# Patient Record
Sex: Female | Born: 1955
Health system: Southern US, Academic
[De-identification: ages and names within clinical notes are randomized; demographics above are authoritative.]

## PROBLEM LIST (undated history)

## (undated) ENCOUNTER — Encounter: Payer: PRIVATE HEALTH INSURANCE | Attending: Adult Health | Primary: Adult Health

## (undated) ENCOUNTER — Ambulatory Visit: Payer: MEDICARE

## (undated) ENCOUNTER — Encounter

## (undated) ENCOUNTER — Ambulatory Visit

## (undated) ENCOUNTER — Encounter: Attending: Medical Oncology | Primary: Medical Oncology

## (undated) ENCOUNTER — Telehealth

## (undated) ENCOUNTER — Ambulatory Visit: Payer: PRIVATE HEALTH INSURANCE

## (undated) ENCOUNTER — Encounter: Attending: Adult Health | Primary: Adult Health

## (undated) ENCOUNTER — Encounter: Attending: Pharmacist | Primary: Pharmacist

## (undated) ENCOUNTER — Encounter: Payer: PRIVATE HEALTH INSURANCE | Attending: Medical Oncology | Primary: Medical Oncology

## (undated) ENCOUNTER — Ambulatory Visit: Payer: Medicare (Managed Care)

## (undated) ENCOUNTER — Telehealth: Attending: Family | Primary: Family

## (undated) ENCOUNTER — Encounter: Attending: Family | Primary: Family

## (undated) ENCOUNTER — Encounter: Payer: PRIVATE HEALTH INSURANCE | Attending: Family | Primary: Family

## (undated) ENCOUNTER — Telehealth: Attending: Medical Oncology | Primary: Medical Oncology

## (undated) ENCOUNTER — Encounter
Attending: Rehabilitative and Restorative Service Providers" | Primary: Rehabilitative and Restorative Service Providers"

## (undated) ENCOUNTER — Encounter: Attending: Gastroenterology | Primary: Gastroenterology

## (undated) ENCOUNTER — Ambulatory Visit: Attending: Mental Health | Primary: Mental Health

## (undated) ENCOUNTER — Encounter
Payer: PRIVATE HEALTH INSURANCE | Attending: Rehabilitative and Restorative Service Providers" | Primary: Rehabilitative and Restorative Service Providers"

## (undated) ENCOUNTER — Telehealth: Attending: Adult Health | Primary: Adult Health

## (undated) ENCOUNTER — Telehealth: Attending: Clinical | Primary: Clinical

## (undated) ENCOUNTER — Ambulatory Visit: Payer: PRIVATE HEALTH INSURANCE | Attending: Registered" | Primary: Registered"

## (undated) ENCOUNTER — Telehealth: Attending: Internal Medicine | Primary: Internal Medicine

## (undated) ENCOUNTER — Encounter: Attending: Internal Medicine | Primary: Internal Medicine

## (undated) ENCOUNTER — Ambulatory Visit: Payer: PRIVATE HEALTH INSURANCE | Attending: Medical Oncology | Primary: Medical Oncology

## (undated) ENCOUNTER — Ambulatory Visit: Attending: Plastic and Reconstructive Surgery | Primary: Plastic and Reconstructive Surgery

## (undated) ENCOUNTER — Telehealth: Attending: Pharmacist | Primary: Pharmacist

## (undated) ENCOUNTER — Ambulatory Visit: Attending: Internal Medicine | Primary: Internal Medicine

## (undated) DIAGNOSIS — T8859XA Other complications of anesthesia, initial encounter: Secondary | ICD-10-CM

## (undated) DIAGNOSIS — T4145XA Adverse effect of unspecified anesthetic, initial encounter: Secondary | ICD-10-CM

## (undated) DIAGNOSIS — F32A Depression, unspecified: Secondary | ICD-10-CM

## (undated) DIAGNOSIS — G629 Polyneuropathy, unspecified: Secondary | ICD-10-CM

## (undated) DIAGNOSIS — D219 Benign neoplasm of connective and other soft tissue, unspecified: Secondary | ICD-10-CM

## (undated) DIAGNOSIS — L680 Hirsutism: Secondary | ICD-10-CM

## (undated) DIAGNOSIS — C4492 Squamous cell carcinoma of skin, unspecified: Secondary | ICD-10-CM

## (undated) DIAGNOSIS — G4730 Sleep apnea, unspecified: Secondary | ICD-10-CM

## (undated) DIAGNOSIS — E785 Hyperlipidemia, unspecified: Secondary | ICD-10-CM

## (undated) DIAGNOSIS — K219 Gastro-esophageal reflux disease without esophagitis: Secondary | ICD-10-CM

## (undated) DIAGNOSIS — F329 Major depressive disorder, single episode, unspecified: Secondary | ICD-10-CM

## (undated) HISTORY — DX: Sleep apnea, unspecified: G47.30

## (undated) HISTORY — DX: Hirsutism: L68.0

## (undated) HISTORY — DX: Hyperlipidemia, unspecified: E78.5

## (undated) HISTORY — DX: Depression, unspecified: F32.A

## (undated) HISTORY — DX: Major depressive disorder, single episode, unspecified: F32.9

## (undated) HISTORY — DX: Benign neoplasm of connective and other soft tissue, unspecified: D21.9

## (undated) HISTORY — DX: Polyneuropathy, unspecified: G62.9

## (undated) HISTORY — DX: Squamous cell carcinoma of skin, unspecified: C44.92

## (undated) HISTORY — DX: Gastro-esophageal reflux disease without esophagitis: K21.9

## (undated) HISTORY — PX: TONSILLECTOMY: SUR1361

---
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
## 1999-12-13 ENCOUNTER — Other Ambulatory Visit: Admission: RE | Admit: 1999-12-13 | Discharge: 1999-12-13 | Payer: Self-pay | Admitting: Family Medicine

## 2001-07-16 ENCOUNTER — Other Ambulatory Visit: Admission: RE | Admit: 2001-07-16 | Discharge: 2001-07-16 | Payer: Self-pay | Admitting: Family Medicine

## 2001-07-17 ENCOUNTER — Ambulatory Visit (HOSPITAL_COMMUNITY): Admission: RE | Admit: 2001-07-17 | Discharge: 2001-07-17 | Payer: Self-pay | Admitting: Family Medicine

## 2001-07-17 ENCOUNTER — Encounter: Payer: Self-pay | Admitting: Family Medicine

## 2002-11-10 ENCOUNTER — Other Ambulatory Visit: Admission: RE | Admit: 2002-11-10 | Discharge: 2002-11-10 | Payer: Self-pay | Admitting: Family Medicine

## 2003-10-26 ENCOUNTER — Ambulatory Visit (HOSPITAL_COMMUNITY): Admission: RE | Admit: 2003-10-26 | Discharge: 2003-10-26 | Payer: Self-pay | Admitting: Family Medicine

## 2004-03-19 ENCOUNTER — Emergency Department (HOSPITAL_COMMUNITY): Admission: EM | Admit: 2004-03-19 | Discharge: 2004-03-19 | Payer: Self-pay | Admitting: *Deleted

## 2004-06-10 ENCOUNTER — Other Ambulatory Visit: Admission: RE | Admit: 2004-06-10 | Discharge: 2004-06-10 | Payer: Self-pay | Admitting: Family Medicine

## 2005-01-05 ENCOUNTER — Encounter: Admission: RE | Admit: 2005-01-05 | Discharge: 2005-01-05 | Payer: Self-pay | Admitting: Family Medicine

## 2005-06-12 ENCOUNTER — Other Ambulatory Visit: Admission: RE | Admit: 2005-06-12 | Discharge: 2005-06-12 | Payer: Self-pay | Admitting: Family Medicine

## 2006-02-13 ENCOUNTER — Encounter: Admission: RE | Admit: 2006-02-13 | Discharge: 2006-02-13 | Payer: Self-pay | Admitting: Family Medicine

## 2006-06-13 ENCOUNTER — Other Ambulatory Visit: Admission: RE | Admit: 2006-06-13 | Discharge: 2006-06-13 | Payer: Self-pay | Admitting: Family Medicine

## 2006-06-25 ENCOUNTER — Ambulatory Visit (HOSPITAL_COMMUNITY): Admission: RE | Admit: 2006-06-25 | Discharge: 2006-06-25 | Payer: Self-pay | Admitting: Family Medicine

## 2007-02-26 ENCOUNTER — Encounter: Admission: RE | Admit: 2007-02-26 | Discharge: 2007-02-26 | Payer: Self-pay | Admitting: Family Medicine

## 2007-05-14 ENCOUNTER — Encounter: Admission: RE | Admit: 2007-05-14 | Discharge: 2007-08-12 | Payer: Self-pay | Admitting: Orthopedic Surgery

## 2007-07-02 ENCOUNTER — Ambulatory Visit (HOSPITAL_COMMUNITY): Admission: RE | Admit: 2007-07-02 | Discharge: 2007-07-02 | Payer: Self-pay

## 2008-06-22 ENCOUNTER — Other Ambulatory Visit: Admission: RE | Admit: 2008-06-22 | Discharge: 2008-06-22 | Payer: Self-pay | Admitting: Family Medicine

## 2008-07-09 ENCOUNTER — Encounter: Admission: RE | Admit: 2008-07-09 | Discharge: 2008-07-09 | Payer: Self-pay | Admitting: Family Medicine

## 2009-07-12 ENCOUNTER — Encounter: Admission: RE | Admit: 2009-07-12 | Discharge: 2009-07-12 | Payer: Self-pay | Admitting: Family Medicine

## 2010-01-17 ENCOUNTER — Encounter: Admission: RE | Admit: 2010-01-17 | Discharge: 2010-01-17 | Payer: Self-pay | Admitting: Family Medicine

## 2010-01-25 ENCOUNTER — Other Ambulatory Visit: Admission: RE | Admit: 2010-01-25 | Discharge: 2010-01-25 | Payer: Self-pay | Admitting: Family Medicine

## 2010-09-02 ENCOUNTER — Other Ambulatory Visit: Payer: Self-pay | Admitting: Family Medicine

## 2010-09-02 DIAGNOSIS — Z1239 Encounter for other screening for malignant neoplasm of breast: Secondary | ICD-10-CM

## 2010-09-02 DIAGNOSIS — Z1231 Encounter for screening mammogram for malignant neoplasm of breast: Secondary | ICD-10-CM

## 2010-09-12 ENCOUNTER — Ambulatory Visit: Payer: Self-pay

## 2011-06-13 ENCOUNTER — Ambulatory Visit: Payer: Self-pay

## 2011-06-15 ENCOUNTER — Ambulatory Visit
Admission: RE | Admit: 2011-06-15 | Discharge: 2011-06-15 | Disposition: A | Discharge status: Home / Self Care | Payer: 59 | Source: Ambulatory Visit | Attending: Family Medicine | Admitting: Family Medicine

## 2011-06-15 DIAGNOSIS — Z1231 Encounter for screening mammogram for malignant neoplasm of breast: Secondary | ICD-10-CM

## 2012-07-26 ENCOUNTER — Other Ambulatory Visit: Payer: Self-pay | Admitting: Family Medicine

## 2012-07-26 DIAGNOSIS — Z1231 Encounter for screening mammogram for malignant neoplasm of breast: Secondary | ICD-10-CM

## 2012-08-01 ENCOUNTER — Ambulatory Visit
Admission: RE | Admit: 2012-08-01 | Discharge: 2012-08-01 | Disposition: A | Discharge status: Home / Self Care | Payer: 59 | Source: Ambulatory Visit | Attending: Family Medicine | Admitting: Family Medicine

## 2012-08-01 DIAGNOSIS — Z1231 Encounter for screening mammogram for malignant neoplasm of breast: Secondary | ICD-10-CM

## 2012-08-14 ENCOUNTER — Ambulatory Visit: Payer: 59

## 2012-09-12 ENCOUNTER — Other Ambulatory Visit (HOSPITAL_COMMUNITY)
Admission: RE | Admit: 2012-09-12 | Discharge: 2012-09-12 | Disposition: A | Discharge status: Home / Self Care | Payer: 59 | Source: Ambulatory Visit | Attending: Family Medicine | Admitting: Family Medicine

## 2012-09-12 ENCOUNTER — Other Ambulatory Visit: Payer: Self-pay | Admitting: Family Medicine

## 2012-09-12 DIAGNOSIS — Z0000 Encounter for general adult medical examination without abnormal findings: Secondary | ICD-10-CM | POA: Insufficient documentation

## 2012-12-27 ENCOUNTER — Other Ambulatory Visit: Payer: Self-pay | Admitting: *Deleted

## 2012-12-27 ENCOUNTER — Ambulatory Visit (INDEPENDENT_AMBULATORY_CARE_PROVIDER_SITE_OTHER): Payer: 59 | Admitting: Nurse Practitioner

## 2012-12-27 ENCOUNTER — Encounter: Payer: Self-pay | Admitting: Nurse Practitioner

## 2012-12-27 VITALS — BP 117/73 | HR 77 | Ht 67.0 in | Wt 211.0 lb

## 2012-12-27 DIAGNOSIS — G589 Mononeuropathy, unspecified: Secondary | ICD-10-CM | POA: Insufficient documentation

## 2012-12-27 DIAGNOSIS — R6889 Other general symptoms and signs: Secondary | ICD-10-CM

## 2012-12-27 NOTE — Progress Notes (Signed)
I reviewed note and agree with plan.   Suanne Marker, MD 12/27/2012, 1:48 PM Certified in Neurology, Neurophysiology and Neuroimaging  [facility] [address] [place], [place] [address] [phone]

## 2012-12-27 NOTE — Progress Notes (Signed)
HPI: Patient returns for followup after initial visit with Dr. Marjory Lies 09/27/2012. She has a history of numbness of the feet which has progressively worsened over time. Previous evaluation by neurology, orthopedist and podiatrist. Her symptoms have gotten worse over the last 2 years and bothersome when she walks or stands for a long period of time. She does not have back pain ,she has no symptoms in her neck or arms. She has also been diagnosed with restless leg syndrome, the symptoms are worse when she lays down at night. Her father and her father's mother both have high arched feet and numbness and tingling. Her symptoms are probably hereditary neuropathy. Labs have been negative  ROS:  negative  Physical Exam General: well developed, well nourished, seated, in no evident distress Head: head normocephalic and atraumatic. Oropharynx benign Neck: supple with no carotid  bruits Cardiovascular: regular rate and rhythm, no murmurs  Neurologic Exam Mental Status: Awake and fully alert. Oriented to place and time. Speech and language are normal .  Cranial Nerves:  Pupils equal, briskly reactive to light. Extraocular movements full without nystagmus. Visual fields full to confrontation. Hearing intact and symmetric to finger snap. Facial sensation intact. Face, tongue, palate move normally and symmetrically. Neck flexion and extension normal.  Motor: Normal bulk and tone. Normal strength in all tested extremity muscles. No focal weakness Sensory.: intact to touch and pinprick and vibratory.  Coordination: Rapid alternating movements normal in all extremities. Finger-to-nose and heel-to-shin performed accurately bilaterally. Gait and Station: Arises from chair without difficulty. Stance is normal.  Able to heel, toe and tandem walk without difficulty.  Reflexes: 2+ and symmetric. Toes downgoing.     ASSESSMENT: Numbness and pain in her feet that is well controlled with gabapentin. Hereditary  neuropathy from history     PLAN: She will continue her gabapentin, she does not need refills She will follow up in 6-8 months  Nilda Riggs, GNP-BC APRN

## 2012-12-27 NOTE — Patient Instructions (Addendum)
Continue Gabapentin at current dose.  Follow up in 6 to 8 months.

## 2013-01-05 DIAGNOSIS — C4492 Squamous cell carcinoma of skin, unspecified: Secondary | ICD-10-CM

## 2013-01-05 HISTORY — PX: OTHER SURGICAL HISTORY: SHX169

## 2013-01-05 HISTORY — DX: Squamous cell carcinoma of skin, unspecified: C44.92

## 2013-01-23 ENCOUNTER — Other Ambulatory Visit: Payer: Self-pay | Admitting: Dermatology

## 2013-02-19 ENCOUNTER — Encounter (INDEPENDENT_AMBULATORY_CARE_PROVIDER_SITE_OTHER): Payer: Self-pay

## 2013-02-20 ENCOUNTER — Ambulatory Visit (INDEPENDENT_AMBULATORY_CARE_PROVIDER_SITE_OTHER): Payer: 59 | Admitting: Surgery

## 2013-02-20 ENCOUNTER — Encounter (INDEPENDENT_AMBULATORY_CARE_PROVIDER_SITE_OTHER): Payer: Self-pay | Admitting: Surgery

## 2013-02-20 VITALS — BP 122/72 | HR 66 | Temp 97.6°F | Resp 15 | Ht 67.0 in | Wt 213.8 lb

## 2013-02-20 DIAGNOSIS — K602 Anal fissure, unspecified: Secondary | ICD-10-CM

## 2013-02-20 NOTE — Patient Instructions (Signed)
Take miralax and metamucil in am and pm.   Anal Fissure, Adult An anal fissure is a small tear or crack in the skin around the anus. Bleeding from a fissure usually stops on its own within a few minutes. However, bleeding will often reoccur with each bowel movement until the crack heals.  CAUSES   Passing large, hard stools.  Frequent diarrheal stools.  Constipation.  Inflammatory bowel disease (Crohn's disease or ulcerative colitis).  Infections.  Anal sex. SYMPTOMS   Small amounts of blood seen on your stools, on toilet paper, or in the toilet after a bowel movement.  Rectal bleeding.  Painful bowel movements.  Itching or irritation around the anus. DIAGNOSIS Your caregiver will examine the anal area. An anal fissure can usually be seen with careful inspection. A rectal exam may be performed and a short tube (anoscope) may be used to examine the anal canal. TREATMENT   You may be instructed to take fiber supplements. These supplements can soften your stool to help make bowel movements easier.  Sitz baths may be recommended to help heal the tear. Do not use soap in the sitz baths.  A medicated cream or ointment may be prescribed to lessen discomfort. HOME CARE INSTRUCTIONS   Maintain a diet high in fruits, whole grains, and vegetables. Avoid constipating foods like bananas and dairy products.  Take sitz baths as directed by your caregiver.  Drink enough fluids to keep your urine clear or pale yellow.  Only take over-the-counter or prescription medicines for pain, discomfort, or fever as directed by your caregiver. Do not take aspirin as this may increase bleeding.  Do not use ointments containing numbing medications (anesthetics) or hydrocortisone. They could slow healing. SEEK MEDICAL CARE IF:   Your fissure is not completely healed within 3 days.  You have further bleeding.  You have a fever.  You have diarrhea mixed with blood.  You have pain.  Your  problem is getting worse rather than better. MAKE SURE YOU:   Understand these instructions.  Will watch your condition.  Will get help right away if you are not doing well or get worse. Document Released: 07/24/2005 Document Revised: 10/16/2011 Document Reviewed: 01/08/2011 [facility] Patient Information 2014 [place], [place].

## 2013-02-20 NOTE — Progress Notes (Signed)
Patient ID: Linda Estrada, female   DOB: [birth year], 57 y.o.   MRN: [id number]  Chief Complaint  Patient presents with  . New Evaluation    eval lrg poss throm hems     HPI Linda Estrada is a 57 y.o. female.   HPIpatient sent at the request of Dr. Cliffton Asters do to severe pain in her anus for 3 weeks. She has a history of constipation and is taking some antibiotics for her previous scan lesion excision. She developed explosive diarrhea after severe constipation an using  enema at that time.since then she has constant pain in her anus made worse with her bowel movements the pain is severe at times. Pain never quite goes away it is constant was like a toothache. Denies any bleeding.  Past Medical History  Diagnosis Date  . Neuropathy   . Depression   . Hirsutism   . GERD (gastroesophageal reflux disease)   . Observed sleep apnea   . Fibroids   . Hyperlipidemia   . Squamous cell skin cancer     Past Surgical History  Procedure Laterality Date  . Tonsillectomy      Family History  Problem Relation Age of Onset  . Coronary artery disease Father   . Hypertension Father   . Diabetes Father   . Thyroid disease Father   . Hyperlipidemia Father   . Hypertension Mother   . Diabetes Mother   . Hyperlipidemia Mother   . Alzheimer's disease Mother   . Kidney failure Mother   . Emphysema Maternal Grandfather   . Diabetes Brother   . Thyroid disease Sister   . Breast cancer Paternal Aunt   . Cancer Paternal Aunt   . Breast cancer Paternal Aunt   . Cancer Paternal Aunt   . Colon cancer Paternal Aunt   . Cancer Paternal Aunt     Social History History  Substance Use Topics  . Smoking status: Never Smoker   . Smokeless tobacco: Never Used  . Alcohol Use: No    Allergies  Allergen Reactions  . Erythromycin Diarrhea  . Penicillins Rash    Current Outpatient Prescriptions  Medication Sig Dispense Refill  . buPROPion (WELLBUTRIN XL) 300 MG 24 hr tablet       . gabapentin  (NEURONTIN) 300 MG capsule 900 mg at bedtime.       . VENTOLIN HFA 108 (90 BASE) MCG/ACT inhaler       . phentermine 30 MG capsule Take 30 mg by mouth every morning.       No current facility-administered medications for this visit.    Review of Systems Review of Systems  Gastrointestinal: Positive for constipation and rectal pain.  Musculoskeletal: Negative.   Skin: Negative.   Hematological: Negative.   Psychiatric/Behavioral: Negative.     Blood pressure 122/72, pulse 66, temperature 97.6 F (36.4 C), temperature source Temporal, resp. rate 15, height 5\' 7"  (1.702 m), weight 213 lb 12.8 oz (96.979 kg).  Physical Exam Physical Exam  Constitutional: She is oriented to person, place, and time. She appears well-developed and well-nourished.  Genitourinary:     Musculoskeletal: Normal range of motion.  Neurological: She is alert and oriented to person, place, and time.  Skin: Skin is warm and dry.  Psychiatric: She has a normal mood and affect. Her behavior is normal. Judgment and thought content normal.    Data Reviewed Dr. Lucilla Lame note  Assessment    Anal fissure  Minimal internal hemorrhoid disease    Plan  Diltiazem gel.high-fiber diet. Continue MiraLAX and fiber supplement. Information about anal fissures given the patient. If this fails I discussed lateral internal sphincterotomy with the pros and cons of this.return 3 weeks.       Hydia Copelin A. 02/20/2013, 3:09 PM

## 2013-02-26 ENCOUNTER — Encounter (INDEPENDENT_AMBULATORY_CARE_PROVIDER_SITE_OTHER): Payer: Self-pay

## 2013-03-12 ENCOUNTER — Ambulatory Visit (INDEPENDENT_AMBULATORY_CARE_PROVIDER_SITE_OTHER): Payer: 59 | Admitting: Surgery

## 2013-03-12 ENCOUNTER — Encounter (INDEPENDENT_AMBULATORY_CARE_PROVIDER_SITE_OTHER): Payer: Self-pay | Admitting: Surgery

## 2013-03-12 VITALS — BP 150/80 | HR 74 | Resp 16 | Ht 67.0 in | Wt 214.2 lb

## 2013-03-12 DIAGNOSIS — K602 Anal fissure, unspecified: Secondary | ICD-10-CM

## 2013-03-12 NOTE — Patient Instructions (Signed)
You will be schedule for  exam under anesthesia possible lateral internal sphincterotomy/  hemorrhoidectomy

## 2013-03-12 NOTE — Progress Notes (Signed)
Subjective:     Patient ID: Linda Estrada, female   DOB: 21-Jun-1956, 57 y.o.   MRN: [id number]  HPI Patient returns in followup for anal fissure and rectal pain. She is no better on medical management.  Review of Systems  Respiratory: Negative.   Gastrointestinal: Positive for anal bleeding and rectal pain.  Genitourinary: Negative.        Objective:   Physical Exam  Constitutional: She is oriented to person, place, and time. She appears well-developed and well-nourished.  HENT:  Head: Normocephalic and atraumatic.  Neck: Normal range of motion. Neck supple.  Genitourinary:  Not reexamined  Musculoskeletal: Normal range of motion.  Neurological: She is alert and oriented to person, place, and time.  Skin: Skin is warm and dry.  Psychiatric: She has a normal mood and affect. Her behavior is normal. Judgment and thought content normal.       Assessment:     Anal fissure/  Rectal pain  No improvement    Plan:     Patient has failed medical management. Recommend exam under anesthesia with possible lateral internal sphincterotomy and possible hemorrhoidectomy. Risks, benefits and alternative therapies discussed. Risk of bleeding, infection, incontinence, and the need for other procedures discussed.The procedure has been discussed with the patient.  Alternative therapies have been discussed with the patient.  Operative risks include bleeding,  Infection,  Organ injury,  Nerve injury,  Blood vessel injury,  DVT,  Pulmonary embolism,  Death,  And possible reoperation.  Medical management risks include worsening of present situation.  The success of the procedure is 50 -90 % at treating patients symptoms.  The patient understands and agrees to proceed.

## 2013-03-13 ENCOUNTER — Encounter (INDEPENDENT_AMBULATORY_CARE_PROVIDER_SITE_OTHER): Payer: 59 | Admitting: Surgery

## 2013-03-13 ENCOUNTER — Encounter (HOSPITAL_COMMUNITY): Payer: Self-pay | Admitting: Pharmacy Technician

## 2013-03-19 ENCOUNTER — Encounter (HOSPITAL_COMMUNITY)
Admission: RE | Admit: 2013-03-19 | Discharge: 2013-03-19 | Disposition: A | Discharge status: Home / Self Care | Payer: 59 | Source: Ambulatory Visit | Attending: Surgery | Admitting: Surgery

## 2013-03-19 ENCOUNTER — Encounter (HOSPITAL_COMMUNITY): Payer: Self-pay

## 2013-03-19 DIAGNOSIS — Z01812 Encounter for preprocedural laboratory examination: Secondary | ICD-10-CM | POA: Insufficient documentation

## 2013-03-19 HISTORY — DX: Other complications of anesthesia, initial encounter: T88.59XA

## 2013-03-19 HISTORY — DX: Adverse effect of unspecified anesthetic, initial encounter: T41.45XA

## 2013-03-19 LAB — CBC WITH DIFFERENTIAL/PLATELET
Basophils Absolute: 0 10*3/uL (ref 0.0–0.1)
HCT: 42.3 % (ref 36.0–46.0)
Hemoglobin: 14.8 g/dL (ref 12.0–15.0)
Lymphocytes Relative: 38 % (ref 12–46)
Lymphs Abs: 3.7 10*3/uL (ref 0.7–4.0)
Monocytes Absolute: 0.9 10*3/uL (ref 0.1–1.0)
Neutro Abs: 5 10*3/uL (ref 1.7–7.7)
RBC: 5.08 MIL/uL (ref 3.87–5.11)
RDW: 13.8 % (ref 11.5–15.5)
WBC: 9.8 10*3/uL (ref 4.0–10.5)

## 2013-03-19 LAB — COMPREHENSIVE METABOLIC PANEL
ALT: 35 U/L (ref 0–35)
AST: 28 U/L (ref 0–37)
CO2: 27 mEq/L (ref 19–32)
Chloride: 102 mEq/L (ref 96–112)
Creatinine, Ser: 0.76 mg/dL (ref 0.50–1.10)
GFR calc non Af Amer: >90 mL/min (ref >90)
Sodium: 137 mEq/L (ref 135–145)
Total Bilirubin: 0.5 mg/dL (ref 0.3–1.2)

## 2013-03-19 NOTE — Patient Instructions (Addendum)
20 Linda Estrada  03/19/2013   Your procedure is scheduled on: 03-25-2013  Report to [facility] at 530 AM.  Call this number if you have problems the morning of surgery [phone]   Remember:Fleets enema morning of surgery, bring cpap mask and tubing   Do not eat food or drink liquids :After Midnight.     Take these medicines the morning of surgery with A SIP OF WATER: wellbutrin, zantac, ventolin inhaler if needed                                SEE [place] PREPARING FOR SURGERY SHEET   Do not wear jewelry, make-up or nail polish.  Do not wear lotions, powders, or perfumes. You may wear deodorant.   Men may shave face and neck.  Do not bring valuables to the hospital. [name] IS NOT RESPONSIBLE FOR VALUEABLES.  Contacts, dentures or bridgework may not be worn into surgery.  Leave suitcase in the car. After surgery it may be brought to your room.  For patients admitted to the hospital, checkout time is 11:00 AM the day of discharge.   Patients discharged the day of surgery will not be allowed to drive home.  Name and phone number of your driver: edward Lotito spouse  Special Instructions: N/A   Please read over the following fact sheets that you were given: MRSA Information.  Call Cain Sieve RN pre op nurse if needed [phone]    FAILURE TO FOLLOW THESE INSTRUCTIONS MAY RESULT IN THE CANCELLATION OF YOUR SURGERY.  PATIENT SIGNATURE___________________________________________  NURSE SIGNATURE_____________________________________________

## 2013-03-20 ENCOUNTER — Telehealth (INDEPENDENT_AMBULATORY_CARE_PROVIDER_SITE_OTHER): Payer: Self-pay | Admitting: *Deleted

## 2013-03-20 ENCOUNTER — Telehealth (INDEPENDENT_AMBULATORY_CARE_PROVIDER_SITE_OTHER): Payer: Self-pay

## 2013-03-20 NOTE — Telephone Encounter (Signed)
Patient is very concerned about some recent changes in her bowel habits over the last two days.  Patient reports she has continued to take daily Miralax, stool softner, Metamucil, fiber pills, and drinking plenty of water every day for some time now.  Patient reports that her normal is a 1 large bowel movement a day.  Patient reports her last normal bowel movement to be 2.5 days ago.  Patient states she did go yesterday morning however it was very small.  Patient asking if she should be concerned and what she should do.  Patient reports constipation and blockages were what caused the fistula and doesn't want to have any more issues.  Explained to patient that since she is already taking so much a message will be sent to Dr. Luisa Hart to ask for his suggestion on what patient should do.  Patient states understanding and agreeable at this time.

## 2013-03-20 NOTE — Telephone Encounter (Signed)
Can pass on enema

## 2013-03-20 NOTE — Telephone Encounter (Signed)
Pt is scheduled for surgery for her anal fissure  03/25/13.  She received pre op instructions for an enema the night before her surgery.  She said that she is in a lot of pain and is very hesitant about giving herself the enema.  She would like to know if there are any other options for bowel prep.  I did tell her there were oral preps, but I would need to check with Dr. Luisa Hart for any instructions.  She agreed and will wait to hear from [place].

## 2013-03-20 NOTE — Telephone Encounter (Signed)
Patient returned call, made patient aware that she can pass on the Enema.  Patient verbalized understanding.

## 2013-03-20 NOTE — Telephone Encounter (Signed)
LMOM. Pt can pass on enema per Dr Luisa Hart message below.

## 2013-03-23 NOTE — Telephone Encounter (Signed)
Needs to see GI specialist.

## 2013-03-24 NOTE — Telephone Encounter (Signed)
I called Linda Estrada and let her know Dr Luisa Hart recommends seeing a GI doctor. She states she is doing better at this time and will hold off on referral. She will call us back if she decides to see GI.

## 2013-03-25 ENCOUNTER — Encounter (HOSPITAL_COMMUNITY)
Admission: RE | Disposition: A | Discharge status: Home / Self Care | Payer: Self-pay | Source: Ambulatory Visit | Attending: Surgery

## 2013-03-25 ENCOUNTER — Encounter (HOSPITAL_COMMUNITY): Payer: Self-pay | Admitting: *Deleted

## 2013-03-25 ENCOUNTER — Encounter (HOSPITAL_COMMUNITY): Payer: Self-pay | Admitting: Anesthesiology

## 2013-03-25 ENCOUNTER — Ambulatory Visit (HOSPITAL_COMMUNITY)
Admission: RE | Admit: 2013-03-25 | Discharge: 2013-03-25 | Disposition: A | Discharge status: Home / Self Care | Payer: 59 | Source: Ambulatory Visit | Attending: Surgery | Admitting: Surgery

## 2013-03-25 ENCOUNTER — Ambulatory Visit (HOSPITAL_COMMUNITY): Payer: 59 | Admitting: Anesthesiology

## 2013-03-25 DIAGNOSIS — G589 Mononeuropathy, unspecified: Secondary | ICD-10-CM

## 2013-03-25 DIAGNOSIS — K648 Other hemorrhoids: Secondary | ICD-10-CM | POA: Insufficient documentation

## 2013-03-25 DIAGNOSIS — K625 Hemorrhage of anus and rectum: Secondary | ICD-10-CM | POA: Insufficient documentation

## 2013-03-25 DIAGNOSIS — K602 Anal fissure, unspecified: Secondary | ICD-10-CM | POA: Insufficient documentation

## 2013-03-25 DIAGNOSIS — K644 Residual hemorrhoidal skin tags: Secondary | ICD-10-CM | POA: Insufficient documentation

## 2013-03-25 DIAGNOSIS — R6889 Other general symptoms and signs: Secondary | ICD-10-CM

## 2013-03-25 HISTORY — PX: EXAMINATION UNDER ANESTHESIA: SHX1540

## 2013-03-25 SURGERY — EXAM UNDER ANESTHESIA
Anesthesia: General | Site: Rectum | Wound class: Clean Contaminated

## 2013-03-25 MED ORDER — OXYCODONE-ACETAMINOPHEN 5-325 MG PO TABS: 1.0000 | ORAL_TABLET | ORAL | Status: DC | PRN

## 2013-03-25 MED ORDER — LACTATED RINGERS IV SOLN
INTRAVENOUS | Status: DC | PRN
  Administered 2013-03-25: 07:00:00 via INTRAVENOUS

## 2013-03-25 MED ORDER — OXYCODONE-ACETAMINOPHEN 5-325 MG PO TABS: 2.0000 | ORAL_TABLET | ORAL | Status: DC | PRN

## 2013-03-25 MED ORDER — DEXAMETHASONE SODIUM PHOSPHATE 10 MG/ML IJ SOLN
INTRAMUSCULAR | Status: DC | PRN
  Administered 2013-03-25: 10 mg via INTRAVENOUS

## 2013-03-25 MED ORDER — KETOROLAC TROMETHAMINE 30 MG/ML IJ SOLN: 15.0000 mg | Freq: Once | INTRAMUSCULAR | Status: DC | PRN

## 2013-03-25 MED ORDER — LACTATED RINGERS IV SOLN: INTRAVENOUS | Status: DC

## 2013-03-25 MED ORDER — PROPOFOL 10 MG/ML IV BOLUS
INTRAVENOUS | Status: DC | PRN
  Administered 2013-03-25: 180 mg via INTRAVENOUS

## 2013-03-25 MED ORDER — FENTANYL CITRATE 0.05 MG/ML IJ SOLN: 25.0000 ug | INTRAMUSCULAR | Status: DC | PRN

## 2013-03-25 MED ORDER — FENTANYL CITRATE 0.05 MG/ML IJ SOLN
INTRAMUSCULAR | Status: DC | PRN
  Administered 2013-03-25 (×3): 50 ug via INTRAVENOUS
  Administered 2013-03-25: 100 ug via INTRAVENOUS

## 2013-03-25 MED ORDER — LIDOCAINE HCL (CARDIAC) 20 MG/ML IV SOLN
INTRAVENOUS | Status: DC | PRN
  Administered 2013-03-25: 50 mg via INTRAVENOUS

## 2013-03-25 MED ORDER — CIPROFLOXACIN IN D5W 400 MG/200ML IV SOLN
400.0000 mg | INTRAVENOUS | Status: AC
  Administered 2013-03-25: 400 mg via INTRAVENOUS

## 2013-03-25 MED ORDER — PROMETHAZINE HCL 25 MG/ML IJ SOLN: 6.2500 mg | INTRAMUSCULAR | Status: DC | PRN

## 2013-03-25 MED ORDER — MIDAZOLAM HCL 5 MG/5ML IJ SOLN
INTRAMUSCULAR | Status: DC | PRN
  Administered 2013-03-25: 2 mg via INTRAVENOUS

## 2013-03-25 MED ORDER — BUPIVACAINE-EPINEPHRINE (PF) 0.5% -1:200000 IJ SOLN
INTRAMUSCULAR | Status: AC
  Filled 2013-03-25: qty 10

## 2013-03-25 MED ORDER — LIDOCAINE HCL 2 % EX GEL: CUTANEOUS | Status: DC | PRN

## 2013-03-25 MED ORDER — FLEET ENEMA 7-19 GM/118ML RE ENEM: 1.0000 | ENEMA | Freq: Once | RECTAL | Status: DC

## 2013-03-25 MED ORDER — 0.9 % SODIUM CHLORIDE (POUR BTL) OPTIME
TOPICAL | Status: DC | PRN
  Administered 2013-03-25: 1000 mL

## 2013-03-25 MED ORDER — TRAMADOL HCL 50 MG PO TABS: 50.0000 mg | ORAL_TABLET | Freq: Four times a day (QID) | ORAL | Status: DC | PRN

## 2013-03-25 MED ORDER — ONDANSETRON HCL 4 MG/2ML IJ SOLN
INTRAMUSCULAR | Status: DC | PRN
  Administered 2013-03-25: 4 mg via INTRAVENOUS

## 2013-03-25 MED ORDER — SODIUM CHLORIDE 0.9 % IJ SOLN
INTRAMUSCULAR | Status: DC | PRN
  Administered 2013-03-25: 08:00:00

## 2013-03-25 MED ORDER — BUPIVACAINE LIPOSOME 1.3 % IJ SUSP
20.0000 mL | Freq: Once | INTRAMUSCULAR | Status: DC
  Filled 2013-03-25: qty 20

## 2013-03-25 MED ORDER — CIPROFLOXACIN IN D5W 400 MG/200ML IV SOLN
INTRAVENOUS | Status: AC
  Filled 2013-03-25: qty 200

## 2013-03-25 SURGICAL SUPPLY — 41 items
BLADE HEX COATED 2.75 (ELECTRODE) ×2 IMPLANT
BLADE SURG 15 STRL LF DISP TIS (BLADE) ×1 IMPLANT
BLADE SURG 15 STRL SS (BLADE) ×2
CLOTH BEACON ORANGE TIMEOUT ST (SAFETY) ×2 IMPLANT
DECANTER SPIKE VIAL GLASS SM (MISCELLANEOUS) ×1 IMPLANT
DRAIN PENROSE 18X1/2 LTX STRL (DRAIN) IMPLANT
DRSG PAD ABDOMINAL 8X10 ST (GAUZE/BANDAGES/DRESSINGS) IMPLANT
ELECT REM PT RETURN 9FT ADLT (ELECTROSURGICAL) ×2
ELECTRODE REM PT RTRN 9FT ADLT (ELECTROSURGICAL) ×1 IMPLANT
GAUZE SPONGE 4X4 16PLY XRAY LF (GAUZE/BANDAGES/DRESSINGS) ×2 IMPLANT
GLOVE BIO SURGEON STRL SZ8 (GLOVE) ×1 IMPLANT
GLOVE BIOGEL PI IND STRL 7.0 (GLOVE) ×1 IMPLANT
GLOVE BIOGEL PI IND STRL 8 (GLOVE) IMPLANT
GLOVE BIOGEL PI INDICATOR 7.0 (GLOVE) ×1
GLOVE BIOGEL PI INDICATOR 8 (GLOVE) ×1
GLOVE INDICATOR 8.0 STRL GRN (GLOVE) ×1 IMPLANT
GLOVE SS BIOGEL STRL SZ 8 (GLOVE) IMPLANT
GLOVE SUPERSENSE BIOGEL SZ 8 (GLOVE) ×1
GOWN STRL NON-REIN LRG LVL3 (GOWN DISPOSABLE) ×2 IMPLANT
GOWN STRL REIN XL XLG (GOWN DISPOSABLE) ×3 IMPLANT
HEMOSTAT SURGICEL 2X3 (HEMOSTASIS) ×1 IMPLANT
KIT BASIN OR (CUSTOM PROCEDURE TRAY) ×2 IMPLANT
LUBRICANT JELLY K Y 4OZ (MISCELLANEOUS) ×2 IMPLANT
NDL HYPO 25X1 1.5 SAFETY (NEEDLE) ×1 IMPLANT
NDL SAFETY ECLIPSE 18X1.5 (NEEDLE) ×1 IMPLANT
NEEDLE HYPO 18GX1.5 SHARP (NEEDLE)
NEEDLE HYPO 25X1 1.5 SAFETY (NEEDLE) ×2 IMPLANT
NS IRRIG 1000ML POUR BTL (IV SOLUTION) ×2 IMPLANT
PACK LITHOTOMY IV (CUSTOM PROCEDURE TRAY) ×1 IMPLANT
PENCIL BUTTON HOLSTER BLD 10FT (ELECTRODE) ×2 IMPLANT
SHEARS HARMONIC 9CM CVD (BLADE) ×1 IMPLANT
SPONGE SURGIFOAM ABS GEL 12-7 (HEMOSTASIS) ×1 IMPLANT
SUT CHROMIC 2 0 SH (SUTURE) IMPLANT
SUT CHROMIC 3 0 SH 27 (SUTURE) IMPLANT
SUT MON AB 3-0 SH 27 (SUTURE) ×4
SUT MON AB 3-0 SH27 (SUTURE) IMPLANT
SYR 50ML LL SCALE MARK (SYRINGE) ×1 IMPLANT
SYR CONTROL 10ML LL (SYRINGE) ×1 IMPLANT
TOWEL OR 17X26 10 PK STRL BLUE (TOWEL DISPOSABLE) ×2 IMPLANT
UNDERPAD 30X30 INCONTINENT (UNDERPADS AND DIAPERS) ×2 IMPLANT
YANKAUER SUCT BULB TIP 10FT TU (MISCELLANEOUS) ×1 IMPLANT

## 2013-03-25 NOTE — Anesthesia Postprocedure Evaluation (Signed)
  Anesthesia Post-op Note  Patient: Linda Estrada  Procedure(s) Performed: Procedure(s) (LRB): EXAM UNDER ANESTHESIA, POSSIBLE LATERAL INTERNAL SPHINCTEROTOMY (N/A)  Patient Location: PACU  Anesthesia Type: General  Level of Consciousness: awake and alert   Airway and Oxygen Therapy: Patient Spontanous Breathing  Post-op Pain: mild  Post-op Assessment: Post-op Vital signs reviewed, Patient's Cardiovascular Status Stable, Respiratory Function Stable, Patent Airway and No signs of Nausea or vomiting  Last Vitals:  Filed Vitals:   03/25/13 0900  BP: 131/77  Pulse: 69  Temp:   Resp: 14    Post-op Vital Signs: stable   Complications: No apparent anesthesia complications

## 2013-03-25 NOTE — Anesthesia Preprocedure Evaluation (Signed)
Anesthesia Evaluation  Patient identified by MRN, date of birth, ID band Patient awake    Reviewed: Allergy & Precautions, H&P , NPO status , Patient's Chart, lab work & pertinent test results  Airway Mallampati: II TM Distance: >3 FB Neck ROM: Full    Dental no notable dental hx.    Pulmonary sleep apnea ,  breath sounds clear to auscultation  Pulmonary exam normal       Cardiovascular negative cardio ROS  Rhythm:Regular Rate:Normal     Neuro/Psych negative neurological ROS  negative psych ROS   GI/Hepatic Neg liver ROS, GERD-  Medicated,  Endo/Other  negative endocrine ROS  Renal/GU negative Renal ROS  negative genitourinary   Musculoskeletal negative musculoskeletal ROS (+)   Abdominal   Peds negative pediatric ROS (+)  Hematology negative hematology ROS (+)   Anesthesia Other Findings   Reproductive/Obstetrics negative OB ROS                           Anesthesia Physical Anesthesia Plan  ASA: II  Anesthesia Plan: General   Post-op Pain Management:    Induction: Intravenous  Airway Management Planned: LMA  Additional Equipment:   Intra-op Plan:   Post-operative Plan:   Informed Consent: I have reviewed the patients History and Physical, chart, labs and discussed the procedure including the risks, benefits and alternatives for the proposed anesthesia with the patient or authorized representative who has indicated his/her understanding and acceptance.   Dental advisory given  Plan Discussed with: CRNA and Surgeon  Anesthesia Plan Comments:         Anesthesia Quick Evaluation

## 2013-03-25 NOTE — Interval H&P Note (Signed)
History and Physical Interval Note:  03/25/2013 7:12 AM  Linda Estrada  has presented today for surgery, with the diagnosis of anal pain/fissure  The various methods of treatment have been discussed with the patient and family. After consideration of risks, benefits and other options for treatment, the patient has consented to  Procedure(s): EXAM UNDER ANESTHESIA, POSSIBLE LATERAL INTERNAL SPHINCTEROTOMY (N/A) as a surgical intervention .  The patient's history has been reviewed, patient examined, no change in status, stable for surgery.  I have reviewed the patient's chart and labs.  Questions were answered to the patient's satisfaction.     Janis Cuffe A.

## 2013-03-25 NOTE — H&P (View-Only) (Signed)
Subjective:     Patient ID: Linda Estrada, female   DOB: 06/07/1956, 57 y.o.   MRN: [id number]  HPI Patient returns in followup for anal fissure and rectal pain. She is no better on medical management.  Review of Systems  Respiratory: Negative.   Gastrointestinal: Positive for anal bleeding and rectal pain.  Genitourinary: Negative.        Objective:   Physical Exam  Constitutional: She is oriented to person, place, and time. She appears well-developed and well-nourished.  HENT:  Head: Normocephalic and atraumatic.  Neck: Normal range of motion. Neck supple.  Genitourinary:  Not reexamined  Musculoskeletal: Normal range of motion.  Neurological: She is alert and oriented to person, place, and time.  Skin: Skin is warm and dry.  Psychiatric: She has a normal mood and affect. Her behavior is normal. Judgment and thought content normal.       Assessment:     Anal fissure/  Rectal pain  No improvement    Plan:     Patient has failed medical management. Recommend exam under anesthesia with possible lateral internal sphincterotomy and possible hemorrhoidectomy. Risks, benefits and alternative therapies discussed. Risk of bleeding, infection, incontinence, and the need for other procedures discussed.The procedure has been discussed with the patient.  Alternative therapies have been discussed with the patient.  Operative risks include bleeding,  Infection,  Organ injury,  Nerve injury,  Blood vessel injury,  DVT,  Pulmonary embolism,  Death,  And possible reoperation.  Medical management risks include worsening of present situation.  The success of the procedure is 50 -90 % at treating patients symptoms.  The patient understands and agrees to proceed.      

## 2013-03-25 NOTE — Op Note (Signed)
NAMELINDY, GARCZYNSKI NO.:  [id number]  MEDICAL RECORD NO.:  [id number]  LOCATION:  [facility]                         FACILITY:  [facility]  PHYSICIAN:  Chaylee Ehrsam A. Micca Matura, M.D.DATE OF BIRTH:  June 28, 1956  DATE OF PROCEDURE:  03/25/2013 DATE OF DISCHARGE:                              OPERATIVE REPORT   PREOPERATIVE DIAGNOSES: 1. Posterior midline anal fissure, nonhealing. 2. Column grade 3 prolapsed internal/external hemorrhoid disease     involving the left lateral and right posterior columns.  POSTOPERATIVE DIAGNOSES: 1. Posterior midline anal fissure, nonhealing. 2. Column grade 3 prolapsed internal/external hemorrhoid disease     involving the left lateral and right posterior columns.  PROCEDURE: 1. Closed right lateral internal sphincterotomy. 2. Internal/external hemorrhoidectomy involving 2 columns left lateral     and right posterior.  SURGEON:  Maisie Fus A. Torian Quintero, M.D.  ANESTHESIA:  LMA with Exparel 20 mL diluted with 20 mL for perianal block.  ESTIMATED BLOOD LOSS:  Less than 50 mL.  SPECIMEN:  Hemorrhoidal tissue to Pathology.  DRAINS:  None.  IV FLUIDS:  Approximately 600 mL of crystalloid.  INDICATIONS FOR PROCEDURE:  The patient is a pleasant 57 year old female, who has had a 75-month history of severe post defecation, rectal, and anal pain.  She was evaluated about a month ago, found to have a posterior midline anal fissure.  External maximal medical management. After about 3 weeks of this, her pain did not improve at all, and her examination did not change.  Discussed different options for treating a nonhealing posterior midline anal fissure, to include to continue medical management, Botox injection, versus lateral internal sphincterotomy.  We discussed all 3 options, the pros and cons of each option at this point in time.  I also discussed the potential for hemorrhoidectomy in the circumstances since she does have hemorrhoid disease.  I  recommended exam under anesthesia initially to better evaluate her anatomy and pathology with the possibility of the above 2 procedures being done.  Risk of sphincterotomy was discussed with her at great length.  Risk of bleeding, infection, recurrence, fecal incontinence of varying degrees, and the need to have the procedures done.  We discussed hemorrhoidectomy as the possibility of complications of bleeding, infection, pain, some urgency after having a bowel movement after surgery, and wound complications.  Success in treating her pain included to be anywhere from 85-90% with the above procedures.  After lengthy discussion, she wished to proceed.  DESCRIPTION OF PROCEDURE:  The patient was met in the holding area and questions were answered.  She was taken back to the operating room, placed supine position on the operating room table, where general anesthesia was initiated.  She was placed in lithotomy and appropriately padded.  Anal canal was then prepped and draped in a sterile fashion. Time-out was done and she received preoperative antibiotics as indicated in the operative record.  Examination was done distally.  Posterior midline anal fissure was seen.  Image relatively deep down to the sphincter.  Anoscope was placed.  The internal sphincter was significantly hypertrophied and the intersphincteric groove was identified.  An 11 blade was then placed in between the 2 sphincter muscles in a sharp edge  to help turn toward the internal sphincter at which time, I placed my finger over the blade and pushed dividing the internal sphincter.  This was done up to the dentate line.  The internal sphincter released and this was easily felt.  She did have significant grade 3 hemorrhoid disease involving the left lateral column and the right posterior column.  I felt that given her symptoms and the need to be addressed.  I talked about this with her upfront as well.  Harmonic scalp was used to  excise the left lateral pile, as well as the right posterior pile to include both the internal and external component. Hemostasis was achieved with oversewn mucosa with 3-0 Monocryl leaving the bottom open to drain.  Upon examination, she was hemostatic.  We placed a packing of Gelfoam which she had Surgicel wrapped around this, it is an anal plug.  ABD pad placed.  The patient taken in lithotomy. She was awoken and taken to recovery in satisfactory condition.  All final counts of sponge, needle, and instruments found to be correct at this portion of the case.     Somaly Marteney A. Abdulahad Mederos, M.D.     TAC/MEDQ  D:  03/25/2013  T:  03/25/2013  Job:  [id number]

## 2013-03-25 NOTE — Transfer of Care (Signed)
Immediate Anesthesia Transfer of Care Note  Patient: Linda Estrada  Procedure(s) Performed: Procedure(s): EXAM UNDER ANESTHESIA, POSSIBLE LATERAL INTERNAL SPHINCTEROTOMY (N/A)  Patient Location: PACU  Anesthesia Type:General  Level of Consciousness: awake, alert  and oriented  Airway & Oxygen Therapy: Patient Spontanous Breathing and Patient connected to face mask oxygen  Post-op Assessment: Report given to PACU RN and Post -op Vital signs reviewed and stable  Post vital signs: Reviewed and stable  Complications: No apparent anesthesia complications

## 2013-03-25 NOTE — Brief Op Note (Signed)
03/25/2013  8:29 AM  PATIENT:  Linda Estrada  57 y.o. female  PRE-OPERATIVE DIAGNOSIS:  anal pain/fissure  POST-OPERATIVE DIAGNOSIS:  anal pain/fissure  PROCEDURE:  Procedure(s): EXAM UNDER ANESTHESIA, POSSIBLE LATERAL INTERNAL SPHINCTEROTOMY (N/A)  SURGEON:  Surgeon(s) and Role:    * Tanieka Pownall A. Shaneya Taketa, MD - Primary      ANESTHESIA:   local and general  EBL:     BLOOD ADMINISTERED:none  DRAINS: none   LOCAL MEDICATIONS USED:  Exparel  SPECIMEN:  Source of Specimen:  hemorrhoid tissue  DISPOSITION OF SPECIMEN:  PATHOLOGY  COUNTS:  YES  TOURNIQUET:  * No tourniquets in log *  DICTATION: .Other Dictation: Dictation Number [id number]  PLAN OF CARE: Discharge to home after PACU  PATIENT DISPOSITION:  PACU - hemodynamically stable.   Delay start of Pharmacological VTE agent (>24hrs) due to surgical blood loss or risk of bleeding: not applicable

## 2013-03-26 ENCOUNTER — Encounter (HOSPITAL_COMMUNITY): Payer: Self-pay | Admitting: Surgery

## 2013-03-26 ENCOUNTER — Telehealth (INDEPENDENT_AMBULATORY_CARE_PROVIDER_SITE_OTHER): Payer: Self-pay | Admitting: *Deleted

## 2013-03-26 NOTE — Telephone Encounter (Signed)
Patient called in this morning reporting she can feel gauze at her rectal opening and concerned about having a BM.  Spoke to Dr. Luisa Hart who states he did a packing at which he is expecting to fall out with a BM.  Patient updated at this time that it is ok for the gauze to fall out.  Encouraged patient not to strain or pull at the gauze.  Also encouraged her to use the stool softeners and drink plenty of water to stay hydrated.  Patient states understanding and agreeable at this time.

## 2013-03-27 ENCOUNTER — Encounter (HOSPITAL_COMMUNITY): Payer: Self-pay | Admitting: Emergency Medicine

## 2013-03-27 ENCOUNTER — Emergency Department (HOSPITAL_COMMUNITY)
Admission: EM | Admit: 2013-03-27 | Discharge: 2013-03-28 | Disposition: A | Discharge status: Home / Self Care | Payer: 59 | Attending: Emergency Medicine | Admitting: Emergency Medicine

## 2013-03-27 ENCOUNTER — Emergency Department (HOSPITAL_COMMUNITY): Payer: 59

## 2013-03-27 DIAGNOSIS — Z872 Personal history of diseases of the skin and subcutaneous tissue: Secondary | ICD-10-CM | POA: Insufficient documentation

## 2013-03-27 DIAGNOSIS — K560 Paralytic ileus: Secondary | ICD-10-CM | POA: Insufficient documentation

## 2013-03-27 DIAGNOSIS — K219 Gastro-esophageal reflux disease without esophagitis: Secondary | ICD-10-CM | POA: Insufficient documentation

## 2013-03-27 DIAGNOSIS — G589 Mononeuropathy, unspecified: Secondary | ICD-10-CM | POA: Insufficient documentation

## 2013-03-27 DIAGNOSIS — F3289 Other specified depressive episodes: Secondary | ICD-10-CM | POA: Insufficient documentation

## 2013-03-27 DIAGNOSIS — F329 Major depressive disorder, single episode, unspecified: Secondary | ICD-10-CM | POA: Insufficient documentation

## 2013-03-27 DIAGNOSIS — R110 Nausea: Secondary | ICD-10-CM | POA: Insufficient documentation

## 2013-03-27 DIAGNOSIS — Z862 Personal history of diseases of the blood and blood-forming organs and certain disorders involving the immune mechanism: Secondary | ICD-10-CM | POA: Insufficient documentation

## 2013-03-27 DIAGNOSIS — G4730 Sleep apnea, unspecified: Secondary | ICD-10-CM | POA: Insufficient documentation

## 2013-03-27 DIAGNOSIS — K567 Ileus, unspecified: Secondary | ICD-10-CM

## 2013-03-27 DIAGNOSIS — Z8639 Personal history of other endocrine, nutritional and metabolic disease: Secondary | ICD-10-CM | POA: Insufficient documentation

## 2013-03-27 DIAGNOSIS — K5900 Constipation, unspecified: Secondary | ICD-10-CM | POA: Insufficient documentation

## 2013-03-27 DIAGNOSIS — R5381 Other malaise: Secondary | ICD-10-CM | POA: Insufficient documentation

## 2013-03-27 DIAGNOSIS — Z79899 Other long term (current) drug therapy: Secondary | ICD-10-CM | POA: Insufficient documentation

## 2013-03-27 DIAGNOSIS — Z85828 Personal history of other malignant neoplasm of skin: Secondary | ICD-10-CM | POA: Insufficient documentation

## 2013-03-27 DIAGNOSIS — R109 Unspecified abdominal pain: Secondary | ICD-10-CM | POA: Insufficient documentation

## 2013-03-27 DIAGNOSIS — Z9981 Dependence on supplemental oxygen: Secondary | ICD-10-CM | POA: Insufficient documentation

## 2013-03-27 LAB — COMPREHENSIVE METABOLIC PANEL
ALT: 27 U/L (ref 0–35)
Alkaline Phosphatase: 72 U/L (ref 39–117)
GFR calc Af Amer: 87 mL/min — ABNORMAL LOW (ref >90)
Glucose, Bld: 104 mg/dL — ABNORMAL HIGH (ref 70–99)
Potassium: 4 mEq/L (ref 3.5–5.1)
Sodium: 142 mEq/L (ref 135–145)
Total Protein: 7.2 g/dL (ref 6.0–8.3)

## 2013-03-27 LAB — CBC WITH DIFFERENTIAL/PLATELET
Eosinophils Absolute: 0.1 10*3/uL (ref 0.0–0.7)
Lymphocytes Relative: 24 % (ref 12–46)
Lymphs Abs: 3.3 10*3/uL (ref 0.7–4.0)
Neutrophils Relative %: 67 % (ref 43–77)
Platelets: 238 10*3/uL (ref 150–400)
RBC: 4.92 MIL/uL (ref 3.87–5.11)
WBC: 14 10*3/uL — ABNORMAL HIGH (ref 4.0–10.5)

## 2013-03-27 MED ORDER — ONDANSETRON HCL 4 MG/2ML IJ SOLN
4.0000 mg | Freq: Once | INTRAMUSCULAR | Status: AC
  Administered 2013-03-27: 4 mg via INTRAVENOUS
  Filled 2013-03-27 (×2): qty 2

## 2013-03-27 MED ORDER — LIDOCAINE HCL 2 % EX GEL
CUTANEOUS | Status: AC
  Filled 2013-03-27: qty 10

## 2013-03-27 MED ORDER — SODIUM CHLORIDE 0.9 % IV BOLUS (SEPSIS)
1000.0000 mL | Freq: Once | INTRAVENOUS | Status: AC
  Administered 2013-03-27: 1000 mL via INTRAVENOUS

## 2013-03-27 MED ORDER — SORBITOL 70 % SOLN
960.0000 mL | TOPICAL_OIL | Freq: Once | ORAL | Status: DC
  Filled 2013-03-27: qty 240

## 2013-03-27 MED ORDER — SODIUM CHLORIDE 0.9 % IV BOLUS (SEPSIS): 1000.0000 mL | Freq: Once | INTRAVENOUS | Status: DC

## 2013-03-27 NOTE — Telephone Encounter (Signed)
Pt called again today concerned because she has still not had a BM.  She is taking the Miralax and stool softeners as directed with no relief.  She is concerned that she still has external hemorrhoids.  I explained that most likely she was feeling post op swelling which would improve with time.  The sitz baths should help as well.  Dr. Luisa Hart paged for further instructions.

## 2013-03-27 NOTE — Telephone Encounter (Signed)
Spoke with Dr. Luisa Hart and he instructed the patient to double the Miralax dose, and if no results to triple the dose.  Sitz bath no less than three times a day and reduce pain medication.  Pt seemed calmer and agreed to all instructions.  She will call tomorrow to let us know how she is doing.

## 2013-03-27 NOTE — Telephone Encounter (Signed)
Take Miralax BID or TID if needed. ITS NOT FORM EXTERNAL OR INTERNAL HEMORRHOIDS.  SOAK IN TUB AND CUT BACK ON PAIN MEDS.

## 2013-03-27 NOTE — Telephone Encounter (Signed)
Patient calling into office very tearful stating that she has not had a bowel movement since Tuesday and that she feel's that she may be impacted.  Patient reports that she has done everything that our office has advised her to do from sitz baths, miralax, stool softeners, drinking plenty of fluids, increasing her activity and still has not had a bowel movement or any reflief from pain.  Patient reports that her pain level is higher than 10.  Patient denies having any nausea/vomiting or fever.  Patient denies having bleeding or drainage from rectal area.  Patient states that she has even doubled up taking miralax and still no bowel movement.  Patient having uncontrolled pain and very tearful with concern, I advised patient to go to the emergency department for further assessment.  Patient states she will go to [facility] ED.   Patient s/p EUA, Closed right lateral internal sphincterotomy Internal/external hemorrhoidectomy involving 2 columns left lateral and right posterior on 03/25/2013 by Dr. Luisa Hart.

## 2013-03-27 NOTE — ED Provider Notes (Signed)
TIME SEEN: 6:08 PM  CHIEF COMPLAINT: Abdominal pain, nausea, constipation  HPI: Patient is a 57 y.o. female with history of recent anal fissure surgery and hemorrhoidectomy 2 days ago who presents the emergency department with constipation, abdominal pain cramping, nausea. Patient reports that she feels like she needs to have a bowel movement but cannot. She denies any fever, chills, vomiting, dysuria or hematuria. She has tried drinking lots of water and taking MiraLax without any relief. She is taking tramadol for pain.  ROS: See HPI Constitutional: no fever  Eyes: no drainage  ENT: no runny nose   Cardiovascular:  no chest pain  Resp: no SOB  GI: no vomiting GU: no dysuria Integumentary: no rash  Allergy: no hives  Musculoskeletal: no leg swelling  Neurological: no slurred speech ROS otherwise negative  PAST MEDICAL HISTORY/PAST SURGICAL HISTORY:  Past Medical History  Diagnosis Date  . Neuropathy     both feet numbness all the time  . Depression   . Hirsutism   . GERD (gastroesophageal reflux disease)   . Fibroids   . Hyperlipidemia   . Observed sleep apnea     cpap, setting of 3.0  . Complication of anesthesia     woke up screaming after surgery as teenager  . Squamous cell skin cancer 01-2013    MEDICATIONS:  Prior to Admission medications   Medication Sig Start Date End Date Taking? Authorizing Provider  acetaminophen (TYLENOL) 500 MG tablet Take 1,000 mg by mouth every 6 (six) hours as needed for pain.    Historical Provider, MD  buPROPion (WELLBUTRIN XL) 300 MG 24 hr tablet Take 300 mg by mouth every morning.  12/18/12   Historical Provider, MD  gabapentin (NEURONTIN) 300 MG capsule Take 900 mg by mouth at bedtime.  12/17/12   Historical Provider, MD  lidocaine (XYLOCAINE JELLY) 2 % jelly Apply topically as needed. 03/25/13   Thomas A. Cornett, MD  methylcellulose packet Take 500 each by mouth 2 (two) times daily.    Historical Provider, MD  naproxen sodium (ANAPROX)  220 MG tablet Take 220 mg by mouth as needed.    Historical Provider, MD  oxyCODONE-acetaminophen (ROXICET) 5-325 MG per tablet Take 2 tablets by mouth every 4 (four) hours as needed for pain. 03/25/13   Thomas A. Cornett, MD  polyethylene glycol (MIRALAX / GLYCOLAX) packet Take 17 g by mouth at bedtime.    Historical Provider, MD  psyllium (METAMUCIL SMOOTH TEXTURE) 28 % packet Take 1 packet by mouth every morning.    Historical Provider, MD  ranitidine (ZANTAC) 150 MG tablet Take 150 mg by mouth daily.    Historical Provider, MD  senna (SENOKOT) 8.6 MG tablet Take 1 tablet by mouth as needed for constipation.    Historical Provider, MD  traMADol (ULTRAM) 50 MG tablet Take 1 tablet (50 mg total) by mouth every 6 (six) hours as needed for pain. 03/25/13   Thomas A. Cornett, MD  VENTOLIN HFA 108 (90 BASE) MCG/ACT inhaler Inhale 2 puffs into the lungs every 4 (four) hours as needed for wheezing or shortness of breath.  09/23/12   Historical Provider, MD    ALLERGIES:  Allergies  Allergen Reactions  . Erythromycin Diarrhea  . Penicillins Rash    SOCIAL HISTORY:  History  Substance Use Topics  . Smoking status: Never Smoker   . Smokeless tobacco: Never Used  . Alcohol Use: No    FAMILY HISTORY: Family History  Problem Relation Age of Onset  . Coronary  artery disease Father   . Hypertension Father   . Diabetes Father   . Thyroid disease Father   . Hyperlipidemia Father   . Hypertension Mother   . Diabetes Mother   . Hyperlipidemia Mother   . Alzheimer's disease Mother   . Kidney failure Mother   . Emphysema Maternal Grandfather   . Diabetes Brother   . Thyroid disease Sister   . Breast cancer Paternal Aunt   . Cancer Paternal Aunt   . Breast cancer Paternal Aunt   . Cancer Paternal Aunt   . Colon cancer Paternal Aunt   . Cancer Paternal Aunt     EXAM: BP 158/86  Pulse 99  Temp(Src) 98.9 F (37.2 C) (Oral)  Resp 20  SpO2 94%  LMP 11/17/2012 CONSTITUTIONAL: Alert and  oriented and responds appropriately to questions. Well-appearing; well-nourished HEAD: Normocephalic EYES: Conjunctivae clear, PERRL ENT: normal nose; no rhinorrhea; moist mucous membranes; pharynx without lesions noted NECK: Supple, no meningismus, no LAD  CARD: RRR; S1 and S2 appreciated; no murmurs, no clicks, no rubs, no gallops RESP: Normal chest excursion without splinting or tachypnea; breath sounds clear and equal bilaterally; no wheezes, no rhonchi, no rales,  ABD/GI: Normal bowel sounds; non-distended; soft, mildly, diffusely tender to palpation, no rebound, no guarding RECTAL:  Patient has perirectal ecchymosis and multiple nonthrombosed hemorrhoids externally, sutures palpated inside the rectum, there is soft stool present in the rectum, no impaction, small amount of gross blood BACK:  The back appears normal and is non-tender to palpation, there is no CVA tenderness EXT: Normal ROM in all joints; non-tender to palpation; no edema; normal capillary refill; no cyanosis    SKIN: Normal color for age and race; warm NEURO: Moves all extremities equally PSYCH: The patient's mood and manner are appropriate. Grooming and personal hygiene are appropriate.  MEDICAL DECISION MAKING: Patient with constipation after her hemorrhoidectomy and anal fissure surgery. She is not impacted. Her abdominal x-ray shows no obstruction. She does have an ileus. Will check labs, urinalysis. Will give smog enema, IV fluids and antiemetics. Anticipate discharge home.  ED PROGRESS: Patient able to have bowel movements and urinate after smog enema. She reports feeling much better and is ready for discharge home. Given supportive care instructions, return precautions. She has outpatient followup with surgery. We'll have her continue to drink plenty of fluids, increase her fiber intake, continue MiraLax and Colace.  Layla Maw Floyde Dingley, DO 03/27/13 2356

## 2013-03-27 NOTE — ED Notes (Signed)
Patient reports that she had anal fissure surgery and hemorrhoidectomy on Tuesday and that she has not had a bowel movement since the surgery. Patient complains of hunger, constipation, weakness, difficulty sleeping, mild abdominal cramping, rectal pain, and mild nausea.

## 2013-03-28 ENCOUNTER — Telehealth (INDEPENDENT_AMBULATORY_CARE_PROVIDER_SITE_OTHER): Payer: Self-pay | Admitting: *Deleted

## 2013-03-28 NOTE — Telephone Encounter (Signed)
Patient called this morning to report that she did go to [facility] ED last night.  Patient reports they gave her a enema which has given her a lot of relief.  Patient states understanding to continue the Miralax, Colace, and drinking plenty of fluid.  Patient just wanted to give [place] an update.

## 2013-04-09 ENCOUNTER — Encounter (INDEPENDENT_AMBULATORY_CARE_PROVIDER_SITE_OTHER): Payer: Self-pay | Admitting: Surgery

## 2013-04-09 ENCOUNTER — Ambulatory Visit (INDEPENDENT_AMBULATORY_CARE_PROVIDER_SITE_OTHER): Payer: 59 | Admitting: Surgery

## 2013-04-09 VITALS — BP 114/74 | HR 72 | Resp 14 | Ht 67.0 in | Wt 203.4 lb

## 2013-04-09 DIAGNOSIS — Z9889 Other specified postprocedural states: Secondary | ICD-10-CM | POA: Insufficient documentation

## 2013-04-09 NOTE — Progress Notes (Signed)
Patient returns after exam and anesthesia with lateral internal sphincterotomy and 2 column internal and external hemorrhoidectomy. She had some initial issues with constipation postop period which have resolved. Still has pain and drainage at this point. No fever or chills. He  Exam: Anal canal clean dry and intact. Wound showing good granulation tissue.  Impression: Status post lateral internal sphincterotomy for anal fissure and 2 column hemorrhoidectomy  Plan: Return in 3 weeks. Continue present care. Add fiber back to diet. Return to work next week if possible. May need extra week off from work depending on pain

## 2013-04-09 NOTE — Patient Instructions (Signed)
Return 3 weeks.  Add fiber back into diet. Back to work next week if you are ready

## 2013-05-01 ENCOUNTER — Encounter (INDEPENDENT_AMBULATORY_CARE_PROVIDER_SITE_OTHER): Payer: Self-pay | Admitting: Surgery

## 2013-05-01 ENCOUNTER — Ambulatory Visit (INDEPENDENT_AMBULATORY_CARE_PROVIDER_SITE_OTHER): Payer: 59 | Admitting: Surgery

## 2013-05-01 VITALS — BP 130/84 | HR 76 | Resp 16 | Ht 67.0 in | Wt 202.2 lb

## 2013-05-01 DIAGNOSIS — Z9889 Other specified postprocedural states: Secondary | ICD-10-CM

## 2013-05-01 NOTE — Progress Notes (Signed)
Patient returns after exam and anesthesia with lateral internal sphincterotomy and 2 column internal and external hemorrhoidectomy. She had some initial issues with constipation postop period which have resolved. She is improved.   Exam: Anal canal clean dry and intact. Wound showing less granulation tissue. No stenosis nl tone  Impression: Status post lateral internal sphincterotomy for anal fissure and 2 column hemorrhoidectomy  Plan: Return prn . Continue present care. Add fiber back to diet. Return to work next week if possible. May need extra week off from work depending on pain

## 2013-05-01 NOTE — Patient Instructions (Signed)
Resume full activity. Return as needed.  

## 2013-06-03 ENCOUNTER — Telehealth: Payer: Self-pay | Admitting: Diagnostic Neuroimaging

## 2013-06-03 MED ORDER — GABAPENTIN 300 MG PO CAPS: 900.0000 mg | ORAL_CAPSULE | Freq: Every day | ORAL | Status: DC

## 2013-06-03 NOTE — Telephone Encounter (Signed)
Refill placed.  Pt has appt with Enid Skeens, NP on 07-10-13.

## 2013-07-07 ENCOUNTER — Other Ambulatory Visit: Payer: Self-pay

## 2013-07-07 DIAGNOSIS — Z1231 Encounter for screening mammogram for malignant neoplasm of breast: Secondary | ICD-10-CM

## 2013-07-10 ENCOUNTER — Encounter (INDEPENDENT_AMBULATORY_CARE_PROVIDER_SITE_OTHER): Payer: Self-pay

## 2013-07-10 ENCOUNTER — Encounter: Payer: Self-pay | Admitting: Nurse Practitioner

## 2013-07-10 ENCOUNTER — Ambulatory Visit (INDEPENDENT_AMBULATORY_CARE_PROVIDER_SITE_OTHER): Payer: 59 | Admitting: Nurse Practitioner

## 2013-07-10 VITALS — BP 116/75 | HR 80 | Ht 68.0 in | Wt 206.0 lb

## 2013-07-10 DIAGNOSIS — R6889 Other general symptoms and signs: Secondary | ICD-10-CM

## 2013-07-10 DIAGNOSIS — G589 Mononeuropathy, unspecified: Secondary | ICD-10-CM

## 2013-07-10 MED ORDER — GABAPENTIN 300 MG PO CAPS: 900.0000 mg | ORAL_CAPSULE | Freq: Every day | ORAL | Status: DC

## 2013-07-10 NOTE — Progress Notes (Signed)
[facility]  PATIENT: Linda Estrada DOB: 12/31/1955   REASON FOR VISIT: followup for peripheral neuropathy   HISTORY OF PRESENT ILLNESS: Linda Estrada, 57 year old female returns for followup. She has a history of neuropathy and her symptoms are fairly well controlled on her gabapentin. Because of daytime drowsiness she only takes the medication at night. She cannot walk or stand for long periods of time, she describes this as feeling like she is walking on stones. She has not had back pain or other symptomology. She has high arches in her father and grandmother also had a neuropathy therefore hers is thought to be hereditary. She feels her medication is controlling her symptoms for the most part and she does not want an increase in medication at this time. She does need refills on her medication.   HISTORY: She has a history of numbness of the feet which has progressively worsened over time. Previous evaluation by neurology, orthopedist and podiatrist. Her symptoms have gotten worse over the last 2 years and bothersome when she walks or stands for a long period of time. She does not have back pain ,she has no symptoms in her neck or arms. She has also been diagnosed with restless leg syndrome, the symptoms are worse when she lays down at night. Her father and her father's mother both have high arched feet and numbness and tingling. Her symptoms are probably hereditary neuropathy. Labs have been negative  REVIEW OF SYSTEMS: Full 14 system review of systems performed and notable only [id number]:[id number] noted all others are negative  Constitutional: N/A  Cardiovascular: Swelling in the leg  Ear/Nose/Throat: N/A  Skin: N/A  Eyes: N/A  Respiratory: N/A  Gastroitestinal: N/A  Hematology/Lymphatic: N/A  Endocrine: N/A Musculoskeletal:N/A  Allergy/Immunology: N/A  Neurological: N/A Psychiatric: N/A   ALLERGIES: Allergies  Allergen Reactions  . Erythromycin Diarrhea  . Penicillins  Rash    HOME MEDICATIONS: Outpatient Prescriptions Prior to Visit  Medication Sig Dispense Refill  . buPROPion (WELLBUTRIN XL) 300 MG 24 hr tablet Take 300 mg by mouth every morning.       . docusate sodium (COLACE) 100 MG capsule Take 100 mg by mouth 2 (two) times daily.      Marland Kitchen gabapentin (NEURONTIN) 300 MG capsule Take 3 capsules (900 mg total) by mouth at bedtime.  90 capsule  3  . polyethylene glycol (MIRALAX / GLYCOLAX) packet Take 17 g by mouth at bedtime.      . ranitidine (ZANTAC) 150 MG tablet Take 150 mg by mouth daily.      . VENTOLIN HFA 108 (90 BASE) MCG/ACT inhaler Inhale 2 puffs into the lungs every 4 (four) hours as needed for wheezing or shortness of breath.       . naproxen sodium (ANAPROX) 220 MG tablet Take 220 mg by mouth as needed (for pain).        No facility-administered medications prior to visit.    PAST MEDICAL HISTORY: Past Medical History  Diagnosis Date  . Neuropathy     both feet numbness all the time  . Depression   . Hirsutism   . GERD (gastroesophageal reflux disease)   . Fibroids   . Hyperlipidemia   . Observed sleep apnea     cpap, setting of 3.0  . Complication of anesthesia     woke up screaming after surgery as teenager  . Squamous cell skin cancer 01-2013    PAST SURGICAL HISTORY: Past Surgical History  Procedure Laterality Date  . Tonsillectomy  as child  . Squamous skin cell cancer removed  01-2013    lower right leg  . Examination under anesthesia N/A 03/25/2013    Procedure: EXAM UNDER ANESTHESIA, POSSIBLE LATERAL INTERNAL SPHINCTEROTOMY;  Surgeon: Maisie Fus A. Cornett, MD;  Location: [facility];  Service: General;  Laterality: N/A;    FAMILY HISTORY: Family History  Problem Relation Age of Onset  . Coronary artery disease Father   . Hypertension Father   . Diabetes Father   . Thyroid disease Father   . Hyperlipidemia Father   . Hypertension Mother   . Diabetes Mother   . Hyperlipidemia Mother   . Alzheimer's disease Mother     . Kidney failure Mother   . Emphysema Maternal Grandfather   . Diabetes Brother   . Thyroid disease Sister   . Breast cancer Paternal Aunt   . Cancer Paternal Aunt   . Breast cancer Paternal Aunt   . Cancer Paternal Aunt   . Colon cancer Paternal Aunt   . Cancer Paternal Aunt     SOCIAL HISTORY: History   Social History  . Marital Status: Married    Spouse Name: ED    Number of Children: 2  . Years of Education: 12+   Occupational History  .  [employer]   Social History Main Topics  . Smoking status: Never Smoker   . Smokeless tobacco: Never Used  . Alcohol Use: No  . Drug Use: No  . Sexual Activity: Not on file   Other Topics Concern  . Not on file   Social History Narrative   Patient lives at home with husband Ed.    Patient has 2 children.    Patient has a high school education plus 2 years of college.     Patient is currently working.      PHYSICAL EXAM  Filed Vitals:   07/10/13 0831  BP: 116/75  Pulse: 80  Height: 5\' 8"  (1.727 m)  Weight: 206 lb (93.441 kg)   Body mass index is 31.33 kg/(m^2).  Generalized: Well developed, obese female in no acute distress  Head: normocephalic and atraumatic,. Oropharynx benign  Neck: Supple, no carotid bruits  Cardiac: Regular rate rhythm, no murmur     Neurological examination   Mentation: Alert oriented to time, place, history taking. Follows all commands speech and language fluent  Cranial nerve II-XII: Pupils were equal round reactive to light extraocular movements were full, visual field were full on confrontational test. Facial sensation and strength were normal. hearing was intact to finger rubbing bilaterally. Uvula tongue midline. head turning and shoulder shrug and were normal and symmetric.Tongue protrusion into cheek strength was normal. Motor: normal bulk and tone, full strength in the BUE, BLE, fine finger movements normal, no pronator drift. No focal weakness Sensory: Decreased pinprick to  knee bilaterally, intact to touch and vibratory   Coordination: finger-nose-finger, heel-to-shin bilaterally, no dysmetria Reflexes: Brachioradialis 2/2, biceps 2/2, triceps 2/2, patellar 2/2, Achilles 1/1, plantar responses were flexor bilaterally. Gait and Station: Rising up from seated position without assistance, normal stance, moderate stride, good arm swing, smooth turning, able to perform tiptoe, and heel walking without difficulty. Tandem gait is steady   DIAGNOSTIC DATA (LABS, IMAGING, TESTING) - I reviewed patient records, labs, notes, testing and imaging myself where available.  Lab Results  Component Value Date   WBC 14.0* 03/27/2013   HGB 14.2 03/27/2013   HCT 41.2 03/27/2013   MCV 83.7 03/27/2013   PLT 238 03/27/2013  Component Value Date/Time   NA 142 03/27/2013 2000   K 4.0 03/27/2013 2000   CL 106 03/27/2013 2000   CO2 27 03/27/2013 2000   GLUCOSE 104* 03/27/2013 2000   BUN 8 03/27/2013 2000   CREATININE 0.86 03/27/2013 2000   CALCIUM 9.6 03/27/2013 2000   PROT 7.2 03/27/2013 2000   ALBUMIN 4.4 03/27/2013 2000   AST 34 03/27/2013 2000   ALT 27 03/27/2013 2000   ALKPHOS 72 03/27/2013 2000   BILITOT 0.6 03/27/2013 2000   GFRNONAA 75* 03/27/2013 2000   GFRAA 87* 03/27/2013 2000     ASSESSMENT AND PLAN  57 y.o. year old female  has a past medical history of Neuropathy; numbness and pain in the feet with symptoms fairly well controlled on gabapentin 900 mg daily. Her neuropathy is hereditary from her history   Continue gabapentin 900 mg daily, will refill Followup in 8 months Nilda Riggs, [facility], [facility], APRN  [facility] [address], [address] [place], [place] [address] [phone]

## 2013-07-10 NOTE — Patient Instructions (Signed)
Continue gabapentin 900 mg daily, will refill Followup in 8 months

## 2013-07-17 NOTE — Progress Notes (Signed)
I reviewed note and agree with plan.   Jordi Kamm R. Destine Zirkle, MD  Certified in Neurology, Neurophysiology and Neuroimaging  [facility] [address], [address] [place], [place] [address] [phone]   

## 2013-08-05 ENCOUNTER — Ambulatory Visit
Admission: RE | Admit: 2013-08-05 | Discharge: 2013-08-05 | Disposition: A | Discharge status: Home / Self Care | Payer: 59 | Source: Ambulatory Visit

## 2013-08-05 DIAGNOSIS — Z1231 Encounter for screening mammogram for malignant neoplasm of breast: Secondary | ICD-10-CM

## 2014-03-10 ENCOUNTER — Encounter: Payer: Self-pay | Admitting: Nurse Practitioner

## 2014-03-10 ENCOUNTER — Ambulatory Visit (INDEPENDENT_AMBULATORY_CARE_PROVIDER_SITE_OTHER): Payer: 59 | Admitting: Nurse Practitioner

## 2014-03-10 VITALS — BP 130/80 | HR 96 | Ht 68.0 in | Wt 206.0 lb

## 2014-03-10 DIAGNOSIS — R6889 Other general symptoms and signs: Secondary | ICD-10-CM

## 2014-03-10 DIAGNOSIS — G589 Mononeuropathy, unspecified: Secondary | ICD-10-CM

## 2014-03-10 MED ORDER — GABAPENTIN 300 MG PO CAPS: ORAL_CAPSULE | ORAL | Status: DC

## 2014-03-10 NOTE — Progress Notes (Signed)
[facility]  PATIENT: Linda Estrada DOB: Aug 15, 1955   REASON FOR VISIT: follow up for neuropathy   HISTORY OF PRESENT ILLNESS:Linda Estrada, 58 year old female returns for followup. She was last seen in this office 07/10/2013. She has a history of neuropathy and her symptoms are fairly well controlled on her gabapentin. Because of daytime drowsiness she only takes the medication at night. She cannot walk or stand for long periods of time, she describes this as feeling like she is walking on stones. She has not had back pain or other symptomology. She has high arches in her father and grandmother also had a neuropathy therefore hers is thought to be hereditary. She feels her medication is controlling her symptoms for the most part but she would like to take an additional dose in the afternoon so that she can walk for exercise. Currently her feet begin to burn if she walks for extended periods of time. She returns for reevaluation   HISTORY: She has a history of numbness of the feet which has progressively worsened over time. Previous evaluation by neurology, orthopedist and podiatrist. Her symptoms have gotten worse over the last 2 years and bothersome when she walks or stands for a long period of time. She does not have back pain ,she has no symptoms in her neck or arms. She has also been diagnosed with restless leg syndrome, the symptoms are worse when she lays down at night. Her father and her father's mother both have high arched feet and numbness and tingling. Her symptoms   REVIEW OF SYSTEMS: Full 14 system review of systems performed and notable only for those listed, all others are neg:  Constitutional: Fatigue  Cardiovascular: N/A  Ear/Nose/Throat: N/A  Skin: N/A  Eyes: N/A  Respiratory: N/A  Gastroitestinal: N/A  Hematology/Lymphatic: N/A  Endocrine: N/A Musculoskeletal:N/A  Allergy/Immunology: N/A  Neurological: N/A Psychiatric: Depression anxiety  Sleep :  NA   ALLERGIES: Allergies  Allergen Reactions  . Erythromycin Diarrhea  . Penicillins Rash    HOME MEDICATIONS: Outpatient Prescriptions Prior to Visit  Medication Sig Dispense Refill  . buPROPion (WELLBUTRIN XL) 300 MG 24 hr tablet Take 300 mg by mouth every morning.       . docusate sodium (COLACE) 100 MG capsule Take 100 mg by mouth daily as needed.       . gabapentin (NEURONTIN) 300 MG capsule Take 3 capsules (900 mg total) by mouth at bedtime.  90 capsule  8  . ranitidine (ZANTAC) 150 MG tablet Take 150 mg by mouth daily.      . VENTOLIN HFA 108 (90 BASE) MCG/ACT inhaler Inhale 2 puffs into the lungs every 4 (four) hours as needed for wheezing or shortness of breath.       . polyethylene glycol (MIRALAX / GLYCOLAX) packet Take 17 g by mouth at bedtime.       No facility-administered medications prior to visit.    PAST MEDICAL HISTORY: Past Medical History  Diagnosis Date  . Neuropathy     both feet numbness all the time  . Depression   . Hirsutism   . GERD (gastroesophageal reflux disease)   . Fibroids   . Hyperlipidemia   . Observed sleep apnea     cpap, setting of 3.0  . Complication of anesthesia     woke up screaming after surgery as teenager  . Squamous cell skin cancer 01-2013    PAST SURGICAL HISTORY: Past Surgical History  Procedure Laterality Date  . Tonsillectomy  as child  . Squamous skin cell cancer removed  01-2013    lower right leg  . Examination under anesthesia N/A 03/25/2013    Procedure: EXAM UNDER ANESTHESIA, POSSIBLE LATERAL INTERNAL SPHINCTEROTOMY;  Surgeon: Marcello Moores A. Cornett, MD;  Location: [facility];  Service: General;  Laterality: N/A;    FAMILY HISTORY: Family History  Problem Relation Age of Onset  . Coronary artery disease Father   . Hypertension Father   . Diabetes Father   . Thyroid disease Father   . Hyperlipidemia Father   . Hypertension Mother   . Diabetes Mother   . Hyperlipidemia Mother   . Alzheimer's disease Mother   .  Kidney failure Mother   . Emphysema Maternal Grandfather   . Diabetes Brother   . Thyroid disease Sister   . Breast cancer Paternal Aunt   . Cancer Paternal Aunt   . Breast cancer Paternal Aunt   . Cancer Paternal Aunt   . Colon cancer Paternal Aunt   . Cancer Paternal Aunt     SOCIAL HISTORY: History   Social History  . Marital Status: Married    Spouse Name: ED    Number of Children: 2  . Years of Education: 12+   Occupational History  .  [employer]   Social History Main Topics  . Smoking status: Never Smoker   . Smokeless tobacco: Never Used  . Alcohol Use: No  . Drug Use: No  . Sexual Activity: Not on file   Other Topics Concern  . Not on file   Social History Narrative   Patient lives at home with husband Ed.    Patient has 2 children.    Patient has a high school education plus 2 years of college.     Patient is currently working.    Patient drinks 1 cup maybe once or twice a week.     PHYSICAL EXAM  Filed Vitals:   03/10/14 0818  BP: 130/80  Pulse: 96  Height: 5\' 8"  (1.727 m)  Weight: 206 lb (93.441 kg)   Body mass index is 31.33 kg/(m^2). Generalized: Well developed, obese female in no acute distress  Head: normocephalic and atraumatic,. Oropharynx benign  Neck: Supple, no carotid bruits  Neurological examination  Mentation: Alert oriented to time, place, history taking. Follows all commands speech and language fluent  Cranial nerve II-XII: Pupils were equal round reactive to light extraocular movements were full, visual field were full on confrontational test. Facial sensation and strength were normal. hearing was intact to finger rubbing bilaterally. Uvula tongue midline. head turning and shoulder shrug and were normal and symmetric.Tongue protrusion into cheek strength was normal.  Motor: normal bulk and tone, full strength in the BUE, BLE, fine finger movements normal, no pronator drift. No focal weakness  Sensory: Decreased pinprick to  ankles, decreased vibratory to ankles. Intact to touch.  Coordination: finger-nose-finger, heel-to-shin bilaterally, no dysmetria  Reflexes: Brachioradialis 2/2, biceps 2/2, triceps 2/2, patellar 2/2, Achilles 1/1, plantar responses were flexor bilaterally.  Gait and Station: Rising up from seated position without assistance, normal stance, moderate stride, good arm swing, smooth turning, able to perform tiptoe, and heel walking without difficulty. Tandem gait is steady   DIAGNOSTIC DATA (LABS, IMAGING, TESTING) - I reviewed patient records, labs, notes, testing and imaging myself where available.  Lab Results  Component Value Date   WBC 14.0* 03/27/2013   HGB 14.2 03/27/2013   HCT 41.2 03/27/2013   MCV 83.7 03/27/2013   PLT 238 03/27/2013  Component Value Date/Time   NA 142 03/27/2013 2000   K 4.0 03/27/2013 2000   CL 106 03/27/2013 2000   CO2 27 03/27/2013 2000   GLUCOSE 104* 03/27/2013 2000   BUN 8 03/27/2013 2000   CREATININE 0.86 03/27/2013 2000   CALCIUM 9.6 03/27/2013 2000   PROT 7.2 03/27/2013 2000   ALBUMIN 4.4 03/27/2013 2000   AST 34 03/27/2013 2000   ALT 27 03/27/2013 2000   ALKPHOS 72 03/27/2013 2000   BILITOT 0.6 03/27/2013 2000   GFRNONAA 75* 03/27/2013 2000   GFRAA 87* 03/27/2013 2000    ASSESSMENT AND PLAN  58 y.o. year old female  has a past medical history of Neuropathy; pain in the feet currently on gabapentin. Her neuropathy is hereditary from history.    Continue gabapentin 3 capsules at night, take additional capsule mid afternoon, if this causes too much drowsiness take 1/2 capsule.  Will refill Followup 8 months Dennie Bible, [facility], [facility], APRN  [facility] [address] [place], [place] [address] [phone]

## 2014-03-10 NOTE — Patient Instructions (Signed)
Continue gabapentin 3 capsules at night, take additional capsule midafternoon Will refill Followup 8 months

## 2014-03-26 NOTE — Progress Notes (Signed)
I reviewed note and agree with plan.   VIKRAM R. PENUMALLI, MD  Certified in Neurology, Neurophysiology and Neuroimaging  [facility] [address], [address] [place], [place] [address] [phone]   

## 2014-06-23 ENCOUNTER — Ambulatory Visit (INDEPENDENT_AMBULATORY_CARE_PROVIDER_SITE_OTHER): Payer: Self-pay | Admitting: Surgery

## 2014-06-23 NOTE — H&P (Signed)
Linda Estrada 06/23/2014 3:09 PM Location: [facility] Patient #: [id number] DOB: 01-10-1956 Married / Language: English / Race: White Female History of Present Illness Linda Estrada First Linda Estrada; 06/23/2014 3:30 PM) Patient words: lipoma elft shoulder  Patient presents at the request of Dr. Dema Severin for mass over her left upper arm/shoulder. This was noted while the patient was kayaking a number of weeks ago. She is not sure along it's been there. She was having some left shoulder pain while kayaking. She was evaluated by her primary care doctor who ordered an ultrasound which showed this to be consistent with lipoma measuring 4 cm superficial. No redness or drainage noted. Patient not sure how long this has been present.  The patient is a 58 year old female   Other Problems Linda Estrada, Linda Estrada; 06/23/2014 3:09 PM) Asthma Back Pain Depression Gastroesophageal Reflux Disease General anesthesia - complications Hemorrhoids Melanoma Migraine Headache Sleep Apnea  Past Surgical History Linda Estrada, Linda Estrada; 06/23/2014 3:09 PM) Anal Fissure Repair Hemorrhoidectomy Oral Surgery Tonsillectomy  Diagnostic Studies History Linda Estrada, Linda Estrada; 06/23/2014 3:09 PM) Colonoscopy 1-5 years ago Mammogram within last year  Allergies Linda Estrada, Linda Estrada; 06/23/2014 3:10 PM) Penicillamine *ASSORTED CLASSES* Erythromycin *DERMATOLOGICALS*  Medication History (Linda Estrada, Linda Estrada; 06/23/2014 3:10 PM) Gabapentin (300MG  Capsule, Oral) Active. BuPROPion HCl ER (XL) (300MG  Tablet ER 24HR, Oral) Active. Zantac 150 Maximum Strength (150MG  Tablet, Oral) Active.  Social History (Linda Estrada; 06/23/2014 3:09 PM) Caffeine use Coffee. No alcohol use No drug use Tobacco use Never smoker.  Family History Linda Estrada, Linda Estrada; 06/23/2014 3:09 PM) Arthritis Mother. Breast Cancer Family Members In [place] Members In General. Depression Father,  Son. Heart Disease Father. Respiratory Condition Mother. Thyroid problems Father, Sister.  Pregnancy / Birth History Linda Estrada, [place]; 06/23/2014 3:09 PM) Age at menarche 50 years. Age of menopause 51-55 Contraceptive History Oral contraceptives. Gravida 2 Irregular periods Maternal age 52-30 Para 2     Review of Systems (Linda Estrada; 06/23/2014 3:09 PM) General Not Present- Appetite Loss, Chills, Fatigue, Fever, Night Sweats, Weight Gain and Weight Loss. Skin Not Present- Change in Wart/Mole, Dryness, Hives, Jaundice, New Lesions, Non-Healing Wounds, Rash and Ulcer. HEENT Not Present- Earache, Hearing Loss, Hoarseness, Nose Bleed, Oral Ulcers, Ringing in the Ears, Seasonal Allergies, Sinus Pain, Sore Throat, Visual Disturbances, Wears glasses/contact lenses and Yellow Eyes. Respiratory Not Present- Bloody sputum, Chronic Cough, Difficulty Breathing, Snoring and Wheezing. Breast Not Present- Breast Mass, Breast Pain, Nipple Discharge and Skin Changes. Cardiovascular Not Present- Chest Pain, Difficulty Breathing Lying Down, Leg Cramps, Palpitations, Rapid Heart Rate, Shortness of Breath and Swelling of Extremities. Gastrointestinal Not Present- Abdominal Pain, Bloating, Bloody Stool, Change in Bowel Habits, Chronic diarrhea, Constipation, Difficulty Swallowing, Excessive gas, Gets full quickly at meals, Hemorrhoids, Indigestion, Nausea, Rectal Pain and Vomiting. Female Genitourinary Not Present- Frequency, Nocturia, Painful Urination, Pelvic Pain and Urgency. Musculoskeletal Not Present- Back Pain, Joint Pain, Joint Stiffness, Muscle Pain, Muscle Weakness and Swelling of Extremities. Neurological Not Present- Decreased Memory, Fainting, Headaches, Numbness, Seizures, Tingling, Tremor, Trouble walking and Weakness. Psychiatric Not Present- Anxiety, Bipolar, Change in Sleep Pattern, Depression, Fearful and Frequent crying. Endocrine Not Present- Cold Intolerance, Excessive  Hunger, Hair Changes, Heat Intolerance, Hot flashes and New Diabetes. Hematology Not Present- Easy Bruising, Excessive bleeding, Gland problems, HIV and Persistent Infections.  Vitals (Linda Estrada Linda Estrada; 06/23/2014 3:11 PM) 06/23/2014 3:10 PM Weight: 196 lb Height: 67in Body Surface Area: 2.05 m Body Mass Index: 30.7 kg/m Temp.: 80F(Temporal)  Pulse: 79 (  Regular)  BP: 118/70 (Sitting, Left Arm, Standard)     Physical Exam (Linda Parkinson A. [place] Linda Estrada; 06/23/2014 3:31 PM)  General Mental Status-Alert. General Appearance-Consistent with stated age. Hydration-Well hydrated. Voice-Normal.  Integumentary Note: 4 cm mass soft mobile appears to be lipoma    Head and Neck Head-normocephalic, atraumatic with no lesions or palpable masses. Trachea-midline. Thyroid Gland Characteristics - normal size and consistency.  Eye Eyeball - Bilateral-Extraocular movements intact. Sclera/Conjunctiva - Bilateral-No scleral icterus.  Chest and Lung Exam Chest and lung exam reveals -quiet, even and easy respiratory effort with no use of accessory muscles and on auscultation, normal breath sounds, no adventitious sounds and normal vocal resonance. Inspection Chest Wall - Normal. Back - normal.  Neurologic Neurologic evaluation reveals -alert and oriented x 3 with no impairment of recent or remote memory. Mental Status-Normal.  Musculoskeletal Normal Exam - Left-Upper Extremity Strength Normal and Lower Extremity Strength Normal. Normal Exam - Right-Upper Extremity Strength Normal and Lower Extremity Strength Normal.    Assessment & Plan (Linda Heroux A. Dajah Fischman Linda Estrada; 06/23/2014 3:32 PM)  LIPOMA OF ARM (214.8  D17.20) Impression: left upper arm 4 cm not causing any pain. verified by U/S. Pt will think about excision and let us kmow. Discuseed the diagnosisi with the patient and that theses have a low risk of malignancy. She voiced understanding and will call to  schedule at her choice.  Current Plans Pt Education - instructions: discussed with patient and provided information.

## 2014-08-18 ENCOUNTER — Other Ambulatory Visit: Payer: Self-pay

## 2014-08-18 DIAGNOSIS — Z1231 Encounter for screening mammogram for malignant neoplasm of breast: Secondary | ICD-10-CM

## 2014-08-25 ENCOUNTER — Ambulatory Visit: Payer: Self-pay

## 2014-08-26 ENCOUNTER — Ambulatory Visit
Admission: RE | Admit: 2014-08-26 | Discharge: 2014-08-26 | Disposition: A | Discharge status: Home / Self Care | Payer: 59 | Source: Ambulatory Visit

## 2014-08-26 DIAGNOSIS — Z1231 Encounter for screening mammogram for malignant neoplasm of breast: Secondary | ICD-10-CM

## 2014-08-31 ENCOUNTER — Other Ambulatory Visit: Payer: Self-pay | Admitting: Family Medicine

## 2014-08-31 DIAGNOSIS — R928 Other abnormal and inconclusive findings on diagnostic imaging of breast: Secondary | ICD-10-CM

## 2014-09-07 ENCOUNTER — Ambulatory Visit
Admission: RE | Admit: 2014-09-07 | Discharge: 2014-09-07 | Disposition: A | Discharge status: Home / Self Care | Payer: 59 | Source: Ambulatory Visit | Attending: Family Medicine | Admitting: Family Medicine

## 2014-09-07 DIAGNOSIS — R928 Other abnormal and inconclusive findings on diagnostic imaging of breast: Secondary | ICD-10-CM

## 2014-10-11 IMAGING — CR DG ABDOMEN ACUTE W/ 1V CHEST
3 series · 3 of 3 positions shown · non-contrast
Comparison: 03/19/2004

CLINICAL DATA: Abdominal pain

ACUTE ABDOMEN SERIES (ABDOMEN 2 VIEW & CHEST 1 VIEW)

[w chest pa]
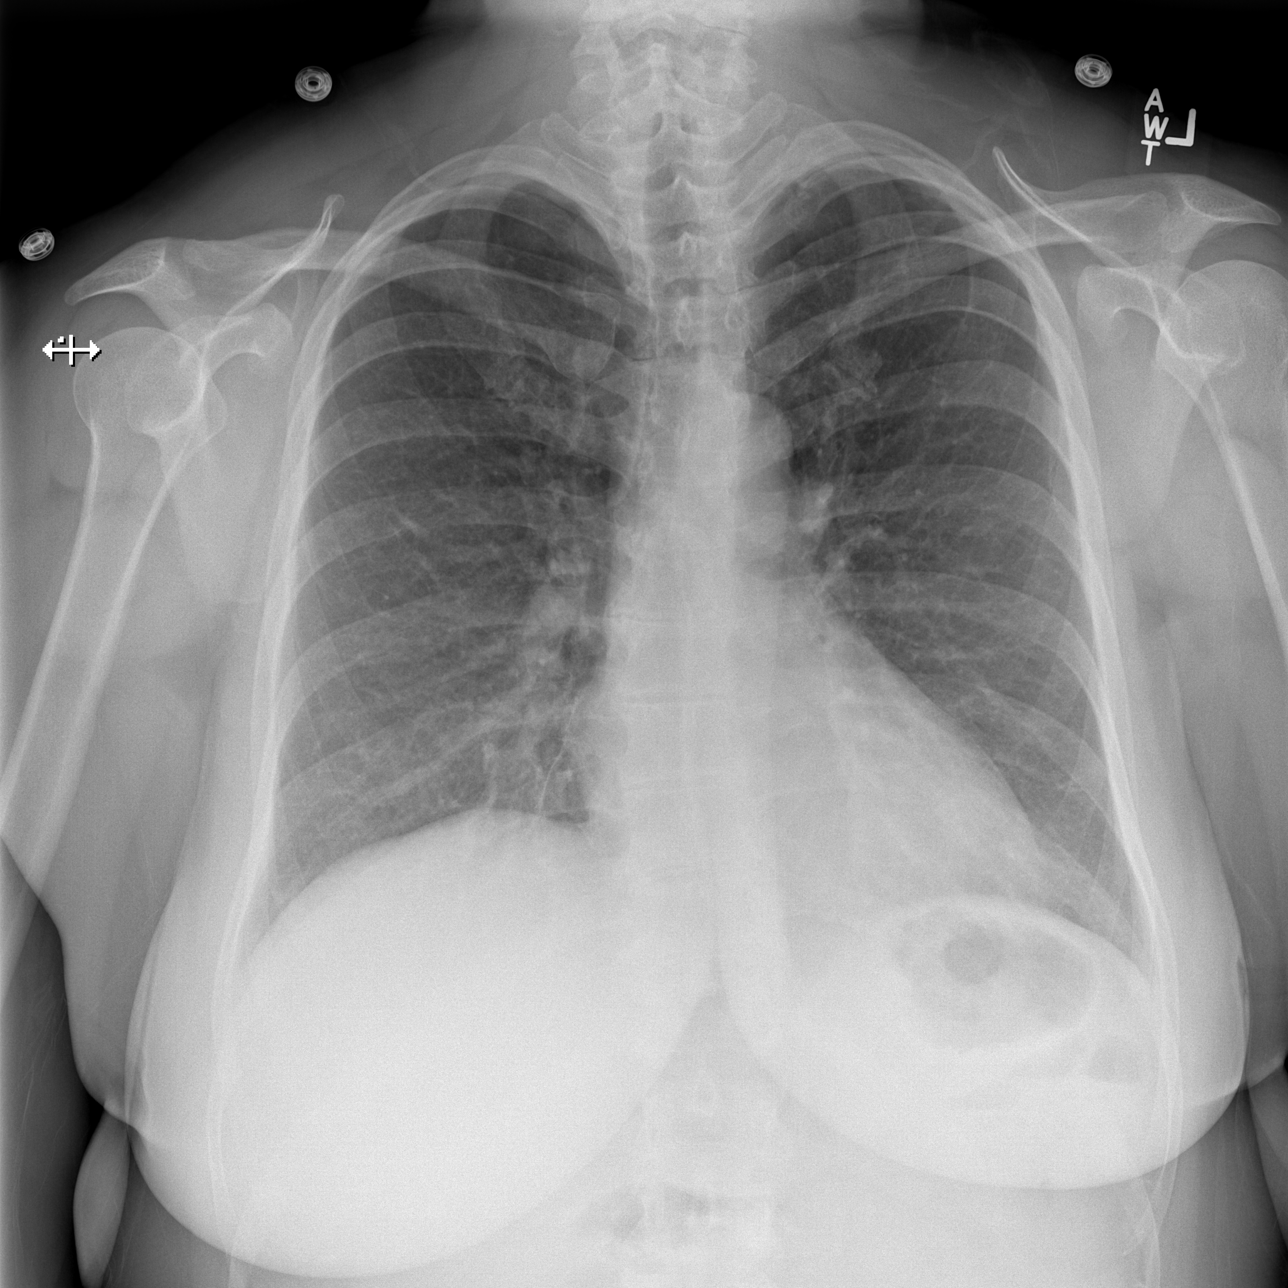

[w abdomen upright]
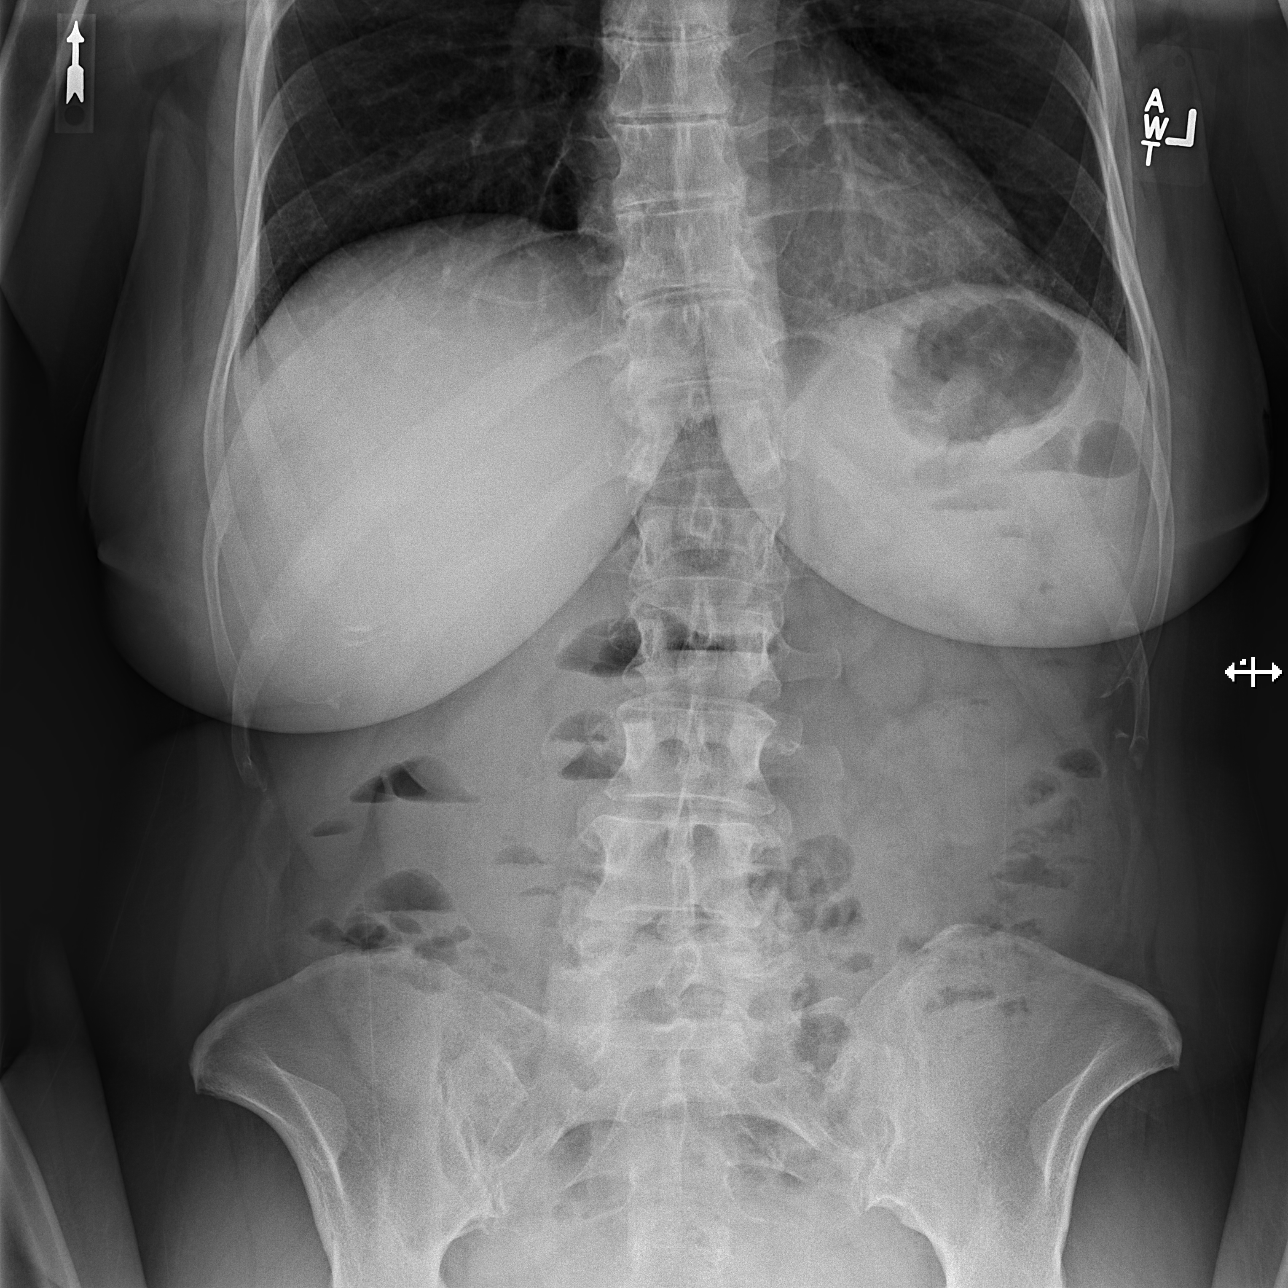

[t abdomen supine]
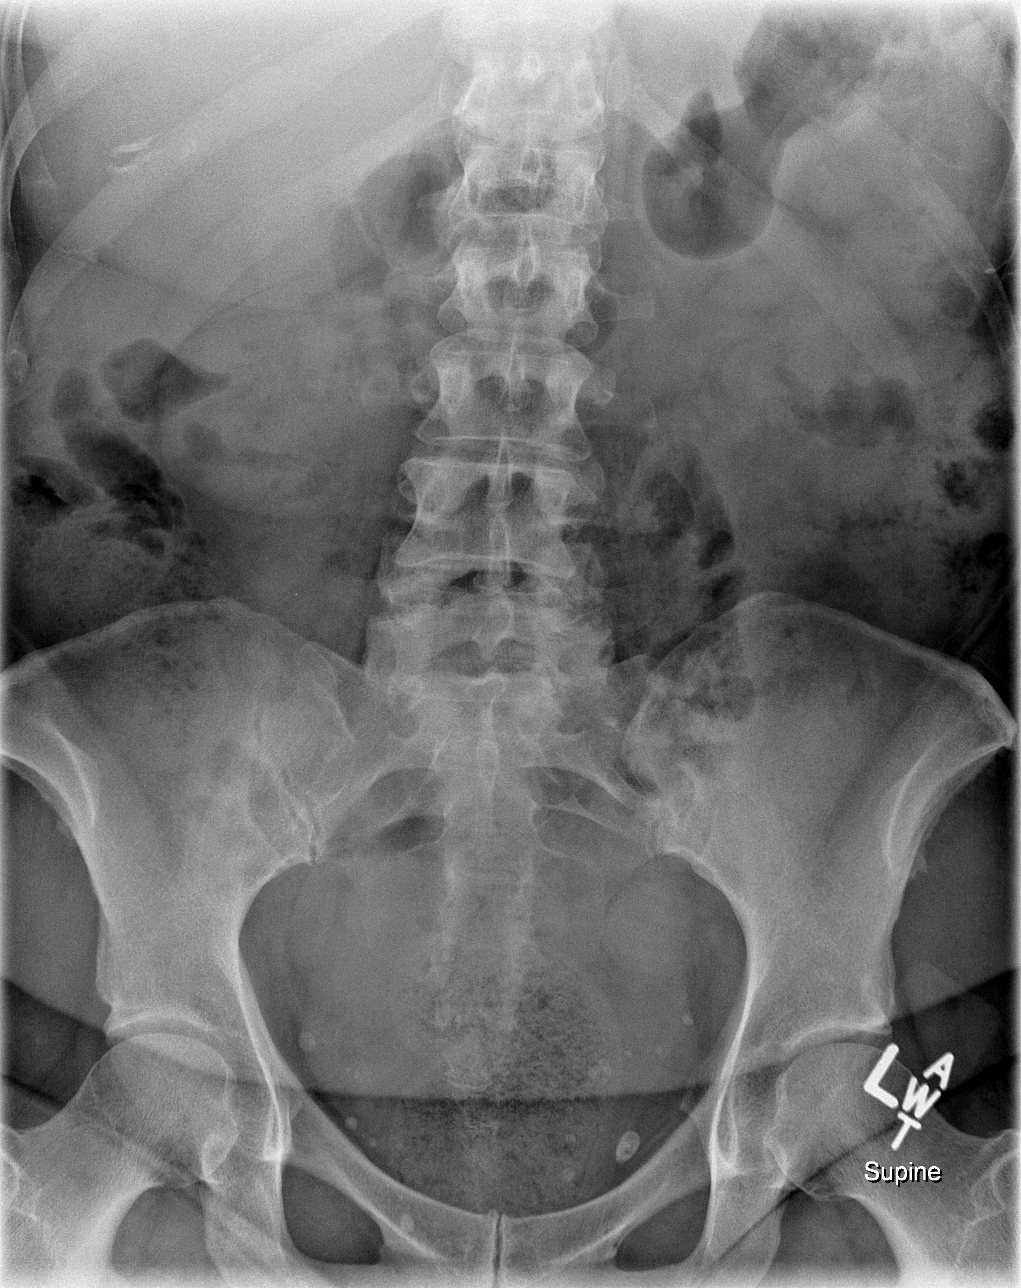

[3 of 3 positions shown; findings below may reference images not displayed]

FINDINGS: Coarse perihilar and bibasilar interstitial markings
without confluent infiltrate or overt edema.  No effusion.  Heart
size upper limits normal.  No free air.  Small bowel is nondilated.
Normal distribution of gas and stool in the colon and mildly
distending the rectum.  Bilateral pelvic phleboliths.  Scattered
fluid levels on the erect film.
IMPRESSION: 1.  Scattered fluid levels in nondilated bowel suggesting
early/mild adynamic ileus.
2.  No free air.
3.  No acute cardiopulmonary disease.

## 2014-10-13 ENCOUNTER — Other Ambulatory Visit: Payer: Self-pay | Admitting: Family Medicine

## 2014-10-13 ENCOUNTER — Other Ambulatory Visit (HOSPITAL_COMMUNITY)
Admission: RE | Admit: 2014-10-13 | Discharge: 2014-10-13 | Disposition: A | Discharge status: Home / Self Care | Payer: 59 | Source: Ambulatory Visit | Attending: Family Medicine | Admitting: Family Medicine

## 2014-10-13 DIAGNOSIS — Z124 Encounter for screening for malignant neoplasm of cervix: Secondary | ICD-10-CM | POA: Insufficient documentation

## 2014-10-15 LAB — CYTOLOGY - PAP

## 2014-11-02 ENCOUNTER — Telehealth: Payer: Self-pay | Admitting: *Deleted

## 2014-11-02 NOTE — Telephone Encounter (Signed)
Called patient and left vm to call the office back & r/s 4/4/ appointment with NP

## 2014-11-04 NOTE — Telephone Encounter (Signed)
Pt called back and appointment was rescheduled for 4/13.

## 2014-11-09 ENCOUNTER — Ambulatory Visit: Payer: 59 | Admitting: Nurse Practitioner

## 2014-11-18 ENCOUNTER — Ambulatory Visit: Payer: Self-pay | Admitting: Nurse Practitioner

## 2014-12-21 ENCOUNTER — Encounter: Payer: Self-pay | Admitting: Nurse Practitioner

## 2014-12-21 ENCOUNTER — Ambulatory Visit (INDEPENDENT_AMBULATORY_CARE_PROVIDER_SITE_OTHER): Payer: 59 | Admitting: Nurse Practitioner

## 2014-12-21 VITALS — BP 115/68 | HR 73 | Ht 67.5 in | Wt 211.0 lb

## 2014-12-21 DIAGNOSIS — G589 Mononeuropathy, unspecified: Secondary | ICD-10-CM | POA: Diagnosis not present

## 2014-12-21 MED ORDER — GABAPENTIN 300 MG PO CAPS: ORAL_CAPSULE | ORAL | Status: DC

## 2014-12-21 NOTE — Patient Instructions (Signed)
Continue Gabpaentin at current dose will refill F/U in 1 year

## 2014-12-21 NOTE — Progress Notes (Signed)
[facility]  PATIENT: Linda Estrada DOB: [birth year]   REASON FOR VISIT: Follow-up for neuropathy pain in the feet HISTORY FROM: Patient    HISTORY OF PRESENT ILLNESS:Ms. Keeran, 59 year old female returns for followup. She was last seen in this office 03/10/14. She has a history of neuropathy and her symptoms are fairly well controlled on her gabapentin. Because of daytime drowsiness she only takes one cap ay noon and the rest of the  medication at night. She cannot walk or stand for long periods of time, she describes this as feeling like she is walking on stones. She has not had back pain or other symptomology. She has high arches in her father and grandmother also had a neuropathy therefore hers is thought to be hereditary. She feels her medication is controlling her symptoms for the most part. Currently her feet begin to burn if she walks for extended periods of time. She returns for reevaluation   HISTORY: She has a history of numbness of the feet which has progressively worsened over time. Previous evaluation by neurology, orthopedist and podiatrist. Her symptoms have gotten worse over the last 2 years and bothersome when she walks or stands for a long period of time. She does not have back pain ,she has no symptoms in her neck or arms. She has also been diagnosed with restless leg syndrome, the symptoms are worse when she lays down at night. Her father and her father's mother both have high arched feet and numbness and tingling. Her symptoms    REVIEW OF SYSTEMS: Full 14 system review of systems performed and notable only for those listed, all others are neg:  Constitutional: neg  Cardiovascular: neg Ear/Nose/Throat: neg  Skin: neg Eyes: neg Respiratory: neg Gastroitestinal: neg  Hematology/Lymphatic: neg  Endocrine: neg Musculoskeletal:neg Allergy/Immunology: neg Neurological: neg Psychiatric: neg Sleep : neg   ALLERGIES: Allergies  Allergen Reactions  .  Erythromycin Diarrhea  . Penicillins Rash    HOME MEDICATIONS: Outpatient Prescriptions Prior to Visit  Medication Sig Dispense Refill  . buPROPion (WELLBUTRIN XL) 300 MG 24 hr tablet Take 300 mg by mouth every morning.     . docusate sodium (COLACE) 100 MG capsule Take 100 mg by mouth daily as needed.     . gabapentin (NEURONTIN) 300 MG capsule 1 cap midafternoon and 3 caps at hs 120 capsule 8  . ranitidine (ZANTAC) 150 MG tablet Take 150 mg by mouth daily.    . VENTOLIN HFA 108 (90 BASE) MCG/ACT inhaler Inhale 2 puffs into the lungs every 4 (four) hours as needed for wheezing or shortness of breath.      No facility-administered medications prior to visit.    PAST MEDICAL HISTORY: Past Medical History  Diagnosis Date  . Neuropathy     both feet numbness all the time  . Depression   . Hirsutism   . GERD (gastroesophageal reflux disease)   . Fibroids   . Hyperlipidemia   . Observed sleep apnea     cpap, setting of 3.0  . Complication of anesthesia     woke up screaming after surgery as teenager  . Squamous cell skin cancer 01-2013    PAST SURGICAL HISTORY: Past Surgical History  Procedure Laterality Date  . Tonsillectomy  as child  . Squamous skin cell cancer removed  01-2013    lower right leg  . Examination under anesthesia N/A 03/25/2013    Procedure: EXAM UNDER ANESTHESIA, POSSIBLE LATERAL INTERNAL SPHINCTEROTOMY;  Surgeon: Marcello Moores A. Cornett, MD;  Location: [facility];  Service: General;  Laterality: N/A;    FAMILY HISTORY: Family History  Problem Relation Age of Onset  . Coronary artery disease Father   . Hypertension Father   . Diabetes Father   . Thyroid disease Father   . Hyperlipidemia Father   . Hypertension Mother   . Diabetes Mother   . Hyperlipidemia Mother   . Alzheimer's disease Mother   . Kidney failure Mother   . Emphysema Maternal Grandfather   . Diabetes Brother   . Thyroid disease Sister   . Breast cancer Paternal Aunt   . Cancer Paternal Aunt    . Breast cancer Paternal Aunt   . Cancer Paternal Aunt   . Colon cancer Paternal Aunt   . Cancer Paternal Aunt     SOCIAL HISTORY: History   Social History  . Marital Status: Married    Spouse Name: ED  . Number of Children: 2  . Years of Education: 12+   Occupational History  .  [employer]   Social History Main Topics  . Smoking status: Never Smoker   . Smokeless tobacco: Never Used  . Alcohol Use: No  . Drug Use: No  . Sexual Activity: Not on file   Other Topics Concern  . Not on file   Social History Narrative   Patient lives at home with husband Ed.    Patient has 2 children.    Patient has a high school education plus 2 years of college.     Patient is currently working.    Patient drinks 1 cup maybe once or twice a week.     PHYSICAL EXAM  Filed Vitals:   12/21/14 0757  BP: 115/68  Pulse: 73  Height: 5' 7.5" (1.715 m)  Weight: 211 lb (95.709 kg)   Body mass index is 32.54 kg/(m^2). Generalized: Well developed, obese female in no acute distress  Head: normocephalic and atraumatic,. Oropharynx benign  Neck: Supple, no carotid bruits  Neurological examination  Mentation: Alert oriented to time, place, history taking. Follows all commands speech and language fluent  Cranial nerve II-XII: Pupils were equal round reactive to light extraocular movements were full, visual field were full on confrontational test. Facial sensation and strength were normal. hearing was intact to finger rubbing bilaterally. Uvula tongue midline. head turning and shoulder shrug and were normal and symmetric.Tongue protrusion into cheek strength was normal.  Motor: normal bulk and tone, full strength in the BUE, BLE, fine finger movements normal, no pronator drift. No focal weakness  Sensory: Decreased pinprick to ankles, decreased vibratory to toes. Intact to touch.  Coordination: finger-nose-finger, heel-to-shin bilaterally, no dysmetria  Reflexes: Brachioradialis  2/2, biceps 2/2, triceps 2/2, patellar 2/2, Achilles 1/1, plantar responses were flexor bilaterally.  Gait and Station: Rising up from seated position without assistance, normal stance, moderate stride, good arm swing, smooth turning, able to perform tiptoe, and heel walking without difficulty. Tandem gait is steady    DIAGNOSTIC DATA (LABS, IMAGING, TESTING) - ASSESSMENT AND PLAN  59 y.o. year old female  has a past medical history of Neuropathy; pain in the feet currently on gabapentin with relief of symptoms. Her neuropathy is hereditary from history.  Continue Gabpaentin at current dose will refill F/U in 1 year Dennie Bible, [facility], [facility], APRN  [facility] [address] [place], [place] [address] [phone]

## 2014-12-21 NOTE — Progress Notes (Signed)
I reviewed note and agree with plan.   Penni Bombard, MD 3/66/2947, 65:46 PM Certified in Neurology, Neurophysiology and Neuroimaging  [facility] [address], [place], [place] [address] [phone]

## 2015-08-05 ENCOUNTER — Other Ambulatory Visit: Payer: Self-pay

## 2015-08-05 DIAGNOSIS — Z1231 Encounter for screening mammogram for malignant neoplasm of breast: Secondary | ICD-10-CM

## 2015-09-01 ENCOUNTER — Ambulatory Visit
Admission: RE | Admit: 2015-09-01 | Discharge: 2015-09-01 | Disposition: A | Discharge status: Home / Self Care | Payer: 59 | Source: Ambulatory Visit

## 2015-09-01 DIAGNOSIS — Z1231 Encounter for screening mammogram for malignant neoplasm of breast: Secondary | ICD-10-CM

## 2015-12-21 ENCOUNTER — Encounter: Payer: Self-pay | Admitting: Nurse Practitioner

## 2015-12-21 ENCOUNTER — Ambulatory Visit (INDEPENDENT_AMBULATORY_CARE_PROVIDER_SITE_OTHER): Payer: 59 | Admitting: Nurse Practitioner

## 2015-12-21 VITALS — BP 122/78 | HR 83 | Ht 67.5 in | Wt 224.8 lb

## 2015-12-21 DIAGNOSIS — G589 Mononeuropathy, unspecified: Secondary | ICD-10-CM | POA: Diagnosis not present

## 2015-12-21 MED ORDER — GABAPENTIN 300 MG PO CAPS: ORAL_CAPSULE | ORAL | Status: DC

## 2015-12-21 NOTE — Progress Notes (Signed)
[facility]  PATIENT: Linda Estrada DOB: 1955-10-06   REASON FOR VISIT: Follow-up for mononeuritis, neuropathy HISTORY FROM: Patient    HISTORY OF PRESENT ILLNESS: UPDATE 12/21/15 CM [name], 60 year old female returns for followup. She was last seen in this office 12/21/14.  She has a history of neuropathy and her symptoms are fairly well controlled on her gabapentin. Because of daytime drowsiness she only takes one cap late afternoon and the rest of the medication at night. She cannot walk or stand for long periods of time, she describes this as feeling like she is walking on stones. She has  back pain and is seeing a Restaurant manager, fast food . She has high arches in her father and grandmother also had a neuropathy therefore hers is thought to be hereditary. She feels her medication is controlling her symptoms for the most part. Currently her feet begin to burn if she walks for extended periods of time. She recently started exercising again by walking. She is currently unemployed but is trying to find work. She returns for reevaluation   HISTORY: She has a history of numbness of the feet which has progressively worsened over time. Previous evaluation by neurology, orthopedist and podiatrist. Her symptoms have gotten worse over the last 2 years and bothersome when she walks or stands for a long period of time. She does not have back pain ,she has no symptoms in her neck or arms. She has also been diagnosed with restless leg syndrome, the symptoms are worse when she lays down at night. Her father and her father's mother both have high arched feet and numbness and tingling. Her symptoms   REVIEW OF SYSTEMS: Full 14 system review of systems performed and notable only for those listed, all others are neg:  Constitutional: neg  Cardiovascular: neg Ear/Nose/Throat: neg  Skin: neg Eyes: Light sensitivity  Respiratory: neg Gastroitestinal: neg  Hematology/Lymphatic: neg  Endocrine:  Intolerance to heat Musculoskeletal: Back pain currently seeing chiropractor Allergy/Immunology: neg Neurological: neg Psychiatric: Depression and anxiety Sleep : neg   ALLERGIES: Allergies  Allergen Reactions  . Erythromycin Diarrhea  . Penicillins Rash    HOME MEDICATIONS: Outpatient Prescriptions Prior to Visit  Medication Sig Dispense Refill  . buPROPion (WELLBUTRIN XL) 300 MG 24 hr tablet Take 300 mg by mouth every morning.     . docusate sodium (COLACE) 100 MG capsule Take 100 mg by mouth daily as needed.     . gabapentin (NEURONTIN) 300 MG capsule 1 cap midafternoon and 3 caps at hs 120 capsule 11  . ranitidine (ZANTAC) 150 MG tablet Take 150 mg by mouth daily.    . VENTOLIN HFA 108 (90 BASE) MCG/ACT inhaler Inhale 2 puffs into the lungs every 4 (four) hours as needed for wheezing or shortness of breath.     . sertraline (ZOLOFT) 50 MG tablet Take 50 mg by mouth daily.      No facility-administered medications prior to visit.    PAST MEDICAL HISTORY: Past Medical History  Diagnosis Date  . Neuropathy (HCC)     both feet numbness all the time  . Depression   . Hirsutism   . GERD (gastroesophageal reflux disease)   . Fibroids   . Hyperlipidemia   . Observed sleep apnea     cpap, setting of 3.0  . Complication of anesthesia     woke up screaming after surgery as teenager  . Squamous cell skin cancer 01-2013    PAST SURGICAL HISTORY: Past Surgical History  Procedure  Laterality Date  . Tonsillectomy  as child  . Squamous skin cell cancer removed  01-2013    lower right leg  . Examination under anesthesia N/A 03/25/2013    Procedure: EXAM UNDER ANESTHESIA, POSSIBLE LATERAL INTERNAL SPHINCTEROTOMY;  Surgeon: Marcello Moores A. Cornett, MD;  Location: [facility];  Service: General;  Laterality: N/A;    FAMILY HISTORY: Family History  Problem Relation Age of Onset  . Coronary artery disease Father   . Hypertension Father   . Diabetes Father   . Thyroid disease Father   .  Hyperlipidemia Father   . Hypertension Mother   . Diabetes Mother   . Hyperlipidemia Mother   . Alzheimer's disease Mother   . Kidney failure Mother   . Emphysema Maternal Grandfather   . Diabetes Brother   . Thyroid disease Sister   . Breast cancer Paternal Aunt   . Cancer Paternal Aunt   . Breast cancer Paternal Aunt   . Cancer Paternal Aunt   . Colon cancer Paternal Aunt   . Cancer Paternal Aunt     SOCIAL HISTORY: Social History   Social History  . Marital Status: Married    Spouse Name: ED  . Number of Children: 2  . Years of Education: 12+   Occupational History  .  [employer]   Social History Main Topics  . Smoking status: Never Smoker   . Smokeless tobacco: Never Used  . Alcohol Use: No  . Drug Use: No  . Sexual Activity: Not on file   Other Topics Concern  . Not on file   Social History Narrative   Patient lives at home with husband Ed.    Patient has 2 children.    Patient has a high school education plus 2 years of college.     Patient is currently working.    Patient drinks 1 cup maybe once or twice a week.     PHYSICAL EXAM  Filed Vitals:   12/21/15 0750  Height: 5' 7.5" (1.715 m)  Weight: 224 lb 12.8 oz (101.969 kg)   Body mass index is 34.67 kg/(m^2). Generalized: Well developed, obese female in no acute distress  Head: normocephalic and atraumatic,. Oropharynx benign  Neck: Supple, no carotid bruits  Skin: 1+ pedal edema  Neurological examination  Mentation: Alert oriented to time, place, history taking. Follows all commands speech and language fluent  Cranial nerve II-XII: Pupils were equal round reactive to light extraocular movements were full, visual field were full on confrontational test. Facial sensation and strength were normal. hearing was intact to finger rubbing bilaterally. Uvula tongue midline. head turning and shoulder shrug and were normal and symmetric.Tongue protrusion into cheek strength was normal.  Motor:  normal bulk and tone, full strength in the BUE, BLE, fine finger movements normal, no pronator drift. No focal weakness  Sensory: Decreased pinprick to ankles, decreased vibratory to toes. Intact to touch.  Coordination: finger-nose-finger, heel-to-shin bilaterally, no dysmetria  Reflexes: Brachioradialis 2/2, biceps 2/2, triceps 2/2, patellar 2/2, Achilles 1/1, plantar responses were flexor bilaterally.  Gait and Station: Rising up from seated position without assistance, normal stance, moderate stride, good arm swing, smooth turning, able to perform tiptoe, and heel walking without difficulty. Tandem gait is steady  DIAGNOSTIC DATA (LABS, IMAGING, TESTING) - ASSESSMENT AND PLAN 60 y.o. year old female has a past medical history of Neuropathy; pain in the feet currently on gabapentin with relief of symptoms. Her neuropathy is hereditary from history.The patient is a current patient of  Dr. Leta Baptist  who is currently not in the office.  This note is sent to the work in doctor.     Continue Gabpaentin at current dose will refill, discussed increasing the medication however patient wants to first be evaluated by the orthopedist for a  problem with her ankle. She will contact me if she wants to increase the medication F/U in 1 year Dennie Bible, [facility], [facility], [facility] [address] [place], [place] [address] [phone]

## 2015-12-21 NOTE — Progress Notes (Signed)
I have reviewed and agreed above plan. 

## 2015-12-21 NOTE — Patient Instructions (Signed)
Continue Gabpaentin at current dose will refill F/U in 1 year 

## 2016-03-13 ENCOUNTER — Telehealth: Payer: Self-pay | Admitting: Nurse Practitioner

## 2016-03-13 MED ORDER — GABAPENTIN 300 MG PO CAPS: ORAL_CAPSULE | ORAL | 11 refills | Status: DC

## 2016-03-13 NOTE — Telephone Encounter (Signed)
Pt taking 300mg  po midafternoon and 900mg  po qhs at this time.

## 2016-03-13 NOTE — Telephone Encounter (Signed)
LMVM for pt on mobile, that increase of 2 - 300mg  caps at midafternoon and 3 caps at bedtime.  New prescription called into [facility].   Pt to call back if needed.

## 2016-03-13 NOTE — Telephone Encounter (Signed)
Patient requesting refill of  gabapentin (NEURONTIN) 300 MG capsule Pharmacy: [facility] [place] Pt said her last OV with Hoyle Sauer they had discussed that if she needed to have it increased to 5 tabs/day to call. She is requesting to increase the doseage

## 2016-03-13 NOTE — Telephone Encounter (Signed)
New Rx has been called in please let patient

## 2016-07-25 ENCOUNTER — Other Ambulatory Visit: Payer: Self-pay | Admitting: Family Medicine

## 2016-07-25 DIAGNOSIS — Z1231 Encounter for screening mammogram for malignant neoplasm of breast: Secondary | ICD-10-CM

## 2016-09-04 ENCOUNTER — Ambulatory Visit: Payer: 59

## 2016-09-25 ENCOUNTER — Ambulatory Visit
Admission: RE | Admit: 2016-09-25 | Discharge: 2016-09-25 | Disposition: A | Discharge status: Home / Self Care | Payer: 59 | Source: Ambulatory Visit | Attending: Family Medicine | Admitting: Family Medicine

## 2016-09-25 DIAGNOSIS — Z1231 Encounter for screening mammogram for malignant neoplasm of breast: Secondary | ICD-10-CM

## 2016-12-20 ENCOUNTER — Ambulatory Visit (INDEPENDENT_AMBULATORY_CARE_PROVIDER_SITE_OTHER): Payer: BLUE CROSS/BLUE SHIELD | Admitting: Nurse Practitioner

## 2016-12-20 ENCOUNTER — Encounter (INDEPENDENT_AMBULATORY_CARE_PROVIDER_SITE_OTHER): Payer: Self-pay

## 2016-12-20 ENCOUNTER — Encounter: Payer: Self-pay | Admitting: Nurse Practitioner

## 2016-12-20 VITALS — BP 132/79 | HR 79 | Ht 67.5 in | Wt 217.0 lb

## 2016-12-20 DIAGNOSIS — G589 Mononeuropathy, unspecified: Secondary | ICD-10-CM

## 2016-12-20 MED ORDER — GABAPENTIN 300 MG PO CAPS: ORAL_CAPSULE | ORAL | 11 refills | Status: AC

## 2016-12-20 NOTE — Patient Instructions (Signed)
Continue Gabpaentin at current dose will refill,  Try to exercise daily by walking 30 min, swimming  Stationary bike Follow up with foot doctor  For orthotics F/U in 1 year

## 2016-12-20 NOTE — Progress Notes (Signed)
[facility]  PATIENT: Linda Estrada DOB: 06/07/1956   REASON FOR VISIT: Follow-up for mononeuritis, neuropathy HISTORY FROM: Patient and husband    HISTORY OF PRESENT ILLNESS:UPDATE 5/16/2018CM. Linda Estrada, 61 year old female returns for yearly follow-up with a history of neuropathy. She is currently on gabapentin and her symptoms are fairly well controlled. She cannot walk or stand for long periods of time and she does not get any exercise. She has high arches and there is a family history of neuropathy in both her father and grandmother. Her feet begin to burn if she walks for extended periods of time. She has had some swelling in her lower legs and has been asked to use compression stockings she does not wear those. She has also had orthotics or shoes which worked for a while but no longer helpful. She returns for reevaluation UPDATE 12/21/15 CM Linda Estrada, 61 year old female returns for followup. She was last seen in this office 12/21/14.  She has a history of neuropathy and her symptoms are fairly well controlled on her gabapentin. Because of daytime drowsiness she only takes one cap late afternoon and the rest of the medication at night. She cannot walk or stand for long periods of time, she describes this as feeling like she is walking on stones. She has  back pain and is seeing a Restaurant manager, fast food . She has high arches in her father and grandmother also had a neuropathy therefore hers is thought to be hereditary. She feels her medication is controlling her symptoms for the most part. Currently her feet begin to burn if she walks for extended periods of time. She recently started exercising again by walking. She is currently unemployed but is trying to find work. She returns for reevaluation   HISTORY: She has a history of numbness of the feet which has progressively worsened over time. Previous evaluation by neurology, orthopedist and podiatrist. Her symptoms have gotten worse over  the last 2 years and bothersome when she walks or stands for a long period of time. She does not have back pain ,she has no symptoms in her neck or arms. She has also been diagnosed with restless leg syndrome, the symptoms are worse when she lays down at night. Her father and her father's mother both have high arched feet and numbness and tingling. Her symptoms   REVIEW OF SYSTEMS: Full 14 system review of systems performed and notable only for those listed, all others are neg:  Constitutional: neg  Cardiovascular: neg Ear/Nose/Throat: neg  Skin: neg Eyes: Light sensitivity  Respiratory: neg Gastroitestinal: neg  Hematology/Lymphatic: neg  Endocrine: Intolerance to heat Musculoskeletal: neg Allergy/Immunology: neg Neurological: neg Psychiatric: Depression and anxiety Sleep : neg   ALLERGIES: Allergies  Allergen Reactions  . Erythromycin Diarrhea  . Penicillins Rash    HOME MEDICATIONS: Outpatient Medications Prior to Visit  Medication Sig Dispense Refill  . buPROPion (WELLBUTRIN XL) 300 MG 24 hr tablet Take 300 mg by mouth every morning.     . docusate sodium (COLACE) 100 MG capsule Take 100 mg by mouth daily as needed.     . gabapentin (NEURONTIN) 300 MG capsule 2cap midafternoon and 3 caps at hs 150 capsule 11  . ranitidine (ZANTAC) 150 MG tablet Take 150 mg by mouth daily.    . VENTOLIN HFA 108 (90 BASE) MCG/ACT inhaler Inhale 2 puffs into the lungs every 4 (four) hours as needed for wheezing or shortness of breath.      No facility-administered medications prior to visit.  PAST MEDICAL HISTORY: Past Medical History:  Diagnosis Date  . Complication of anesthesia    woke up screaming after surgery as teenager  . Depression   . Fibroids   . GERD (gastroesophageal reflux disease)   . Hirsutism   . Hyperlipidemia   . Neuropathy    both feet numbness all the time  . Observed sleep apnea    cpap, setting of 3.0  . Squamous cell skin cancer 01-2013    PAST  SURGICAL HISTORY: Past Surgical History:  Procedure Laterality Date  . EXAMINATION UNDER ANESTHESIA N/A 03/25/2013   Procedure: EXAM UNDER ANESTHESIA, POSSIBLE LATERAL INTERNAL SPHINCTEROTOMY;  Surgeon: Joyice Faster. Cornett, MD;  Location: [facility];  Service: General;  Laterality: N/A;  . squamous skin cell cancer removed  01-2013   lower right leg  . TONSILLECTOMY  as child    FAMILY HISTORY: Family History  Problem Relation Age of Onset  . Coronary artery disease Father   . Hypertension Father   . Diabetes Father   . Thyroid disease Father   . Hyperlipidemia Father   . Hypertension Mother   . Diabetes Mother   . Hyperlipidemia Mother   . Alzheimer's disease Mother   . Kidney failure Mother   . Emphysema Maternal Grandfather   . Diabetes Brother   . Thyroid disease Sister   . Breast cancer Paternal Aunt   . Cancer Paternal Aunt   . Breast cancer Paternal Aunt   . Cancer Paternal Aunt   . Colon cancer Paternal Aunt   . Cancer Paternal Aunt     SOCIAL HISTORY: Social History   Social History  . Marital status: Married    Spouse name: Linda Estrada  . Number of children: 2  . Years of education: 12+   Occupational History  .  [employer]   Social History Main Topics  . Smoking status: Never Smoker  . Smokeless tobacco: Never Used  . Alcohol use No  . Drug use: No  . Sexual activity: Not on file   Other Topics Concern  . Not on file   Social History Narrative   Patient lives at home with husband Linda Estrada.    Patient has 2 children.    Patient has a high school education plus 2 years of college.     Patient is currently working.    Patient drinks 1 cup maybe once or twice a week.     PHYSICAL EXAM  Vitals:   12/20/16 0748  BP: 132/79  Pulse: 79  Weight: 217 lb (98.4 kg)  Height: 5' 7.5" (1.715 m)   Body mass index is 33.49 kg/m. Generalized: Well developed, obese female in no acute distress  Head: normocephalic and atraumatic,. Oropharynx benign  Neck: Supple,  no carotid bruits  Skin: 1+ pedal edema  Neurological examination  Mentation: Alert oriented to time, place, history taking. Follows all commands speech and language fluent  Cranial nerve II-XII: Pupils were equal round reactive to light extraocular movements were full, visual field were full on confrontational test. Facial sensation and strength were normal. hearing was intact to finger rubbing bilaterally. Uvula tongue midline. head turning and shoulder shrug and were normal and symmetric.Tongue protrusion into cheek strength was normal.  Motor: normal bulk and tone, full strength in the BUE, BLE, fine finger movements normal, no pronator drift. No focal weakness  Sensory: Decreased pinprick to ankles, decreased vibratory to toes. Intact to touch.  Coordination: finger-nose-finger, heel-to-shin bilaterally, no dysmetria  Reflexes: Brachioradialis 2/2, biceps 2/2,  triceps 2/2, patellar 2/2, Achilles 1/1, plantar responses were flexor bilaterally.  Gait and Station: Rising up from seated position without assistance, normal stance, moderate stride, good arm swing, smooth turning, able to perform tiptoe, and heel walking without difficulty. Tandem gait is mildly unsteady .  DIAGNOSTIC DATA (LABS, IMAGING, TESTING) - ASSESSMENT AND PLAN 61 y.o. year old female has a past medical history of Neuropathy; pain in the feet currently on gabapentin with relief of symptoms. Her neuropathy is hereditary from history.   She has 1+ edema of both lower extremities, patient does not wear compression stockings.  Continue Gabpaentin at current dose will refill,  Try to exercise daily by walking 30 min, swimming  Stationary bike Follow up with foot doctor  for orthotics F/U in 1 year I spent 20 min  in total face to face time with the patient more than 50% of which was spent counseling and coordination of care, reviewing test results reviewing medications and discussing and reviewing the diagnosis of  neuropathy and further treatment options. Also discussed the importance of exercise and water aerobics stationary bike will not affect her walking , Dennie Bible, [facility], [facility], APRN  [facility] [address], [place], [place] [address] [phone]

## 2017-05-23 ENCOUNTER — Other Ambulatory Visit: Payer: Self-pay | Admitting: Family Medicine

## 2017-05-23 DIAGNOSIS — N63 Unspecified lump in unspecified breast: Secondary | ICD-10-CM

## 2017-06-07 ENCOUNTER — Ambulatory Visit
Admission: RE | Admit: 2017-06-07 | Discharge: 2017-06-07 | Disposition: A | Discharge status: Home / Self Care | Payer: BLUE CROSS/BLUE SHIELD | Source: Ambulatory Visit | Attending: Family Medicine | Admitting: Family Medicine

## 2017-06-07 ENCOUNTER — Other Ambulatory Visit (HOSPITAL_COMMUNITY)
Admission: RE | Admit: 2017-06-07 | Discharge: 2017-06-07 | Disposition: A | Discharge status: Home / Self Care | Payer: BLUE CROSS/BLUE SHIELD | Source: Ambulatory Visit | Attending: Family Medicine | Admitting: Family Medicine

## 2017-06-07 ENCOUNTER — Other Ambulatory Visit: Payer: Self-pay | Admitting: Family Medicine

## 2017-06-07 DIAGNOSIS — Z124 Encounter for screening for malignant neoplasm of cervix: Secondary | ICD-10-CM | POA: Insufficient documentation

## 2017-06-07 DIAGNOSIS — N63 Unspecified lump in unspecified breast: Secondary | ICD-10-CM

## 2017-06-08 LAB — CYTOLOGY - PAP: DIAGNOSIS: NEGATIVE

## 2017-06-13 ENCOUNTER — Ambulatory Visit
Admission: RE | Admit: 2017-06-13 | Discharge: 2017-06-13 | Disposition: A | Discharge status: Home / Self Care | Payer: BLUE CROSS/BLUE SHIELD | Source: Ambulatory Visit | Attending: Family Medicine | Admitting: Family Medicine

## 2017-06-13 ENCOUNTER — Emergency Department
Admission: EM | Admit: 2017-06-13 | Discharge: 2017-06-13 | Disposition: A | Discharge status: Home / Self Care | Payer: BC Managed Care – PPO | Source: Intra-hospital

## 2017-06-13 ENCOUNTER — Emergency Department
Admission: EM | Admit: 2017-06-13 | Discharge: 2017-06-13 | Disposition: A | Discharge status: Home / Self Care | Source: Intra-hospital

## 2017-06-13 DIAGNOSIS — N6320 Unspecified lump in the left breast, unspecified quadrant: Principal | ICD-10-CM

## 2017-06-13 DIAGNOSIS — M79622 Pain in left upper arm: Secondary | ICD-10-CM

## 2017-06-13 DIAGNOSIS — N6489 Other specified disorders of breast: Secondary | ICD-10-CM

## 2017-06-13 DIAGNOSIS — N63 Unspecified lump in unspecified breast: Secondary | ICD-10-CM

## 2017-06-19 ENCOUNTER — Ambulatory Visit: Admit: 2017-06-19 | Discharge: 2017-06-19 | Payer: BC Managed Care – PPO

## 2017-06-19 DIAGNOSIS — C50912 Malignant neoplasm of unspecified site of left female breast: Principal | ICD-10-CM

## 2017-06-20 ENCOUNTER — Ambulatory Visit
Admission: RE | Admit: 2017-06-20 | Discharge: 2017-06-20 | Disposition: A | Discharge status: Home / Self Care | Payer: BC Managed Care – PPO

## 2017-06-20 ENCOUNTER — Ambulatory Visit
Admission: RE | Admit: 2017-06-20 | Discharge: 2017-06-20 | Disposition: A | Discharge status: Home / Self Care | Payer: BC Managed Care – PPO | Attending: Radiation Oncology | Admitting: Radiation Oncology

## 2017-06-20 DIAGNOSIS — C50912 Malignant neoplasm of unspecified site of left female breast: Principal | ICD-10-CM

## 2017-06-20 DIAGNOSIS — C50412 Malignant neoplasm of upper-outer quadrant of left female breast: Principal | ICD-10-CM

## 2017-07-02 ENCOUNTER — Ambulatory Visit
Admission: RE | Admit: 2017-07-02 | Discharge: 2017-07-02 | Disposition: A | Discharge status: Home / Self Care | Payer: BC Managed Care – PPO

## 2017-07-02 DIAGNOSIS — C50412 Malignant neoplasm of upper-outer quadrant of left female breast: Principal | ICD-10-CM

## 2017-07-03 ENCOUNTER — Ambulatory Visit
Admission: RE | Admit: 2017-07-03 | Discharge: 2017-07-03 | Disposition: A | Discharge status: Home / Self Care | Payer: BC Managed Care – PPO

## 2017-07-03 ENCOUNTER — Ambulatory Visit
Admission: RE | Admit: 2017-07-03 | Discharge: 2017-07-03 | Disposition: A | Discharge status: Home / Self Care | Payer: BC Managed Care – PPO | Attending: Certified Registered" | Admitting: Certified Registered"

## 2017-07-03 DIAGNOSIS — C50412 Malignant neoplasm of upper-outer quadrant of left female breast: Principal | ICD-10-CM

## 2017-07-03 MED ORDER — OXYCODONE-ACETAMINOPHEN 5 MG-325 MG TABLET
ORAL_TABLET | Freq: Four times a day (QID) | ORAL | 0 refills | 0 days | Status: CP
Start: 2017-07-03 — End: 2017-07-10

## 2017-07-03 MED ORDER — CLINDAMYCIN HCL 300 MG CAPSULE
ORAL_CAPSULE | Freq: Three times a day (TID) | ORAL | 0 refills | 0.00000 days | Status: CP
Start: 2017-07-03 — End: 2017-07-13

## 2017-07-13 ENCOUNTER — Ambulatory Visit
Admission: RE | Admit: 2017-07-13 | Discharge: 2017-07-13 | Disposition: A | Discharge status: Home / Self Care | Payer: BC Managed Care – PPO | Admitting: Adult Health

## 2017-07-13 DIAGNOSIS — C50412 Malignant neoplasm of upper-outer quadrant of left female breast: Principal | ICD-10-CM

## 2017-07-13 MED ORDER — OXYCODONE 5 MG CAPSULE
ORAL_CAPSULE | ORAL | 0 refills | 0 days | Status: CP | PRN
Start: 2017-07-13 — End: 2017-10-05

## 2017-07-20 ENCOUNTER — Ambulatory Visit
Admission: RE | Admit: 2017-07-20 | Discharge: 2017-07-20 | Disposition: A | Discharge status: Home / Self Care | Payer: BC Managed Care – PPO | Attending: Medical Oncology | Admitting: Medical Oncology

## 2017-07-20 DIAGNOSIS — C50412 Malignant neoplasm of upper-outer quadrant of left female breast: Principal | ICD-10-CM

## 2017-07-23 NOTE — Unmapped (Addendum)
Breast Cancer New Patient Evaluation:     Referring Physician: Aris Everts, Md  [address]  [id number] Physician [address]  [place], [place] [address]. [phone]    PCP: Stacie Acres WHITE, MD    Assessment/Plan:  I spent at least 40 minutes with this patient more than 50% in counseling. The following issues were discussed:  A 61yo woman with a Left breast pT3pN2a, IDC, G2, ER+(95%)/PR+(30%)/HER2-neg, 5/6 LN positive, +LVI and positive surgical margins s/p BCT with targeted axillary LN bx on 07/03/17. Unfortunately her pathology revealed R1 resection and she requires re-excision with ALND. We discussed that given her higher risk disease (tumor size, positive LNs, LVI), adjuvant chemotherapy is recommended.  Would normally do ddAC-T but she has pre-exsisting neuropathy which makes use of weekly paclitaxel challenging so would try q 2 week taxol.  She is postmenopausal so will plan for an aromatase inhibitor for her HR+ diease.     - Overall plan is re-excision, ddAC x4C, followed by a taxane (), f/b RT and then antiestrogen therapy (an AI) for 5-74yrs  - CT-CAP to complete staging  - TTE in anticipation of chemotherapy  - Will get port placed after surgery  - Re-excision plus ALND surgery currently planned for 08/14/16 but hoping to bring that forward  - We will plan to start chemotherapy 2-3 weeks after surgery  - Will have SW/Financial Counseling see her at next visit    Pt. seen and discussed with Dr. Vladimir Creeks MD  Hematology/Oncology Fellow    =  Reason for Visit: A 61 y.o. female with breast cancer referred for consultation by Aris Everts, Md  [address]  [address]b [id number] Physician [address]  [place], [place] [address] for recommendations concerning the management of breast cancer.    She initially presented with self-palpated left breast mass and workup/surgery revealed an 8.3cm tumor HR+/HER2-neg with positive LNs. She is recovering well from surgery.  Drains removed a week ago.She has some pain on the left side of her breast and pain plus numbness down inner side of her left arm. She reports that she has baseline neuropathy in her feet and is followed by a Neuroloogist in [place].  She does not have diabetes and states that the neuropathy is due to structural issues with her feet and high arches and runs in her family. She has no history of heart issues, never had an MI or heart failure. She recently moved to [facility] from [place].  She was laid off from her job and was working part-time but could not qualify for [employer] on the open market so she had to quit her part-time job.  States she would appreciate seeing a Artist to figure out how to pay for all this.    Breast Cancer History:   Oncology History    2018: Left breast cT2 N1a, IDC, G1, +/+/-.        Malignant neoplasm of upper-outer quadrant of left female breast (CMS-HCC)    06/07/2017 -  Presenting Symptoms     Left breast palpable UOQ mass         06/07/2017 Interval Scan(s)     Diagnostic MMG at [facility]: left breast UOQ mass difficult to measure, but appears to measure at least 5.6 cm.    [place]: left breast ill defined shadowing lesion at 2:00, 6 CFN measuring 3.7 x 2.6 x 2.5 cm.     Single mildly abnormal node in left axilla with  constriction of fatty hilum.         06/13/2017 Biopsy     [facility] core biopsy of left breast 2:00, 6 CFN: IDC, G1, ER+(95%), PR+(30%), HER2 negative by FISH.  [facility] core biopsy left axillary LN: +LN metastasis.         06/13/2017 -  Other     Seen in [facility] ED following core biopsy in [facility] for axillary pain with non-expanding 4 cm hematoma noted in left axilla. No intervention required other than symptomatic measures (compression, pain management).         06/20/2017 Initial Diagnosis     Malignant neoplasm of upper-outer quadrant of left female breast (CMS-HCC)         06/20/2017 Tumor Board     MDC recs: left breast cT2 N1a IDC, G1, receptors pending. Per outside imaging there was a single abnormal LN found by [place] only. Awaiting receptors. Need SAVI into that clipped node. Discussed that she otherwise fits ACOSOG Z0011 criteria with negative clinical exam prior to biopsy and single abnormal LN on axillary [place]. Reasonable to do SLNB and excise biopsied node as well. If receptors favorable, consider surgery first approach, good BCT candidate. If high risk (TN, HER+) may need NACT approach. Briefly discussed ALTERNATE, but consensus was that she will likely be recommended for chemo given age and nodal positivity. Meeting surgery, rad/onc today. Will circle back with med/onc as needed.         07/03/2017 Surgery     Left [facility] localized/US guided partial mastectomy and left sentinel lymph node biopsy with targeted axillary lymph node excision: Invasive mammary carcinoma with mixed ductal and lobular features present in association with in situ carcinoma with mixed ductal and lobular features measuring 8.3 cm with positive inferior and lateral margin. Sentinel lymph node biopsy: 1/1 node positive for metastatic disease measuring 7 mm. No ECE. Palpable axillary lymph nodes: 4/4 lymph nodes with metastatic disease measuring 10 mm.  No ECE.  Additional palpable axillary lymph node 0/1 with isolated tumor cells. SAVI scout in the axilla was defective and there was no localization signal from the SAVI probe    ??    ??                  Past Medical History:  Patient Active Problem List   Diagnosis   ??? Malignant neoplasm of upper-outer quadrant of left female breast (CMS-HCC)       Gyn History:   LMP 2012    Surgical History:   Past Surgical History:   Procedure Laterality Date   ??? ANAL FISSURECTOMY     ??? BREAST BIOPSY Left 06/2017    malignant   ??? BREAST BIOPSY Left 06/2017    Lympnode bx-positive   ??? PR BX/REMV,LYMPH NODE,DEEP AXILL Left 07/03/2017    Procedure: BX/EXC LYMPH NODE; OPEN, DEEP AXILRY NODE;  Surgeon: Talbert Cage, DO;  Location: ASC OR [facility];  Service: Surgical Oncology   ??? PR INTRAOPERATIVE SENTINEL LYMPH NODE ID W DYE INJECTION Left 07/03/2017    Procedure: INTRAOPERATIVE IDENTIFICATION SENTINEL LYMPH NODE(S) INCLUDE INJECTION NON-RADIOACTIVE DYE, WHEN PERFORMED;  Surgeon: Talbert Cage, DO;  Location: ASC OR [facility];  Service: Surgical Oncology   ??? PR MASTECTOMY, PARTIAL Left 07/03/2017    Procedure: MASTECTOMY, savi scout PARTIAL (EG, LUMPECTOMY, TYLECTOMY, QUADRANTECTOMY, SEGMENTECTOMY);  Surgeon: Talbert Cage, DO;  Location: ASC OR [place];  Service: Surgical Oncology       Medications:  Current Outpatient Prescriptions  Medication Sig Dispense Refill   ??? acetaminophen (TYLENOL) 500 MG tablet Take 1,000 mg by mouth every four (4) hours as needed for pain.     ??? buPROPion (WELLBUTRIN XL) 150 MG 24 hr tablet Take 300 mg by mouth daily.      ??? docusate sodium (COLACE) 100 MG capsule Take 100 mg by mouth daily with breakfast.     ??? FLUoxetine (PROZAC) 10 MG tablet Take 10 mg by mouth daily.     ??? GABAPENTIN ORAL Take by mouth Two (2) times a day. Pt taking 1 capsule at noon and 3 capsules at 8PM     ??? multivit with iron,hematinic (SUPER B-COMPLEX ORAL) Take 1 capsule by mouth daily with breakfast.     ??? oxyCODONE (OXY-IR) 5 mg capsule Take 1 capsule (5 mg total) by mouth every four (4) hours as needed for pain. 20 capsule 0   ??? ranitidine (ZANTAC) 150 MG tablet Take 150 mg by mouth Two (2) times a day.       No current facility-administered medications for this visit.        Allergies:  Allergies   Allergen Reactions   ??? Erythromycin Diarrhea   ??? Penicillins Rash       Personal and Social History:  Social History     Social History   ??? Marital status: Married     Spouse name: N/A   ??? Number of children: N/A   ??? Years of education: N/A     Occupational History   ??? Not on file.     Social History Main Topics   ??? Smoking status: Never Smoker   ??? Smokeless tobacco: Never Used   ??? Alcohol use No   ??? Drug use: No   ??? Sexual activity: Not on file     Other Topics Concern   ??? Not on file     Social History Narrative    The patient is married. She lives at home with her husband, Ed.  She has two children       Family History:  Cancer-related family history includes Breast cancer in her paternal aunt. There is no history of Cancer or Ovarian cancer.  indicated that the status of her mother is unknown. She indicated that the status of her father is unknown. She indicated that the status of her sister is unknown. She indicated that the status of her maternal grandmother is unknown. She indicated that the status of her maternal grandfather is unknown. She indicated that the status of her paternal grandmother is unknown. She indicated that the status of her paternal grandfather is unknown. She indicated that the status of her daughter is unknown. She indicated that the status of her neg hx is unknown.       Review of Systems: A complete review of systems was obtained including: Constitutional, Eyes, ENT, Cardiovascular, Respiratory, GI, GU, Musculoskeletal, Skin, Neurological, Psychiatric, Endocrine, Heme/Lymphatic, and Allergic/Immunologic systems. It is negative or non-contributory to the patient???s management except for the following: None    Physical Examination:  Vital Signs: BP 126/70  - Pulse 90  - Temp 37 ??C (98.6 ??F) (Oral)  - Resp 18  - Ht 171 cm (5' 7.32)  - Wt 99.5 kg (219 lb 6.4 oz)  - SpO2 97%  - BMI 34.03 kg/m??   General:  Healthy-appearing female in no acute distress..  Cardiovascular:  Heart not clinically enlarged.  No significant murmurs or gallops.  No lower extremity edema.  Respiratory:  Chest clear to percussion and auscultation.   Gastrointestinal:  Abdomen soft without masses and tenderness, no hepatosplenomegaly or other masses.   Musculoskeletal:  No bony pain or tenderness.   Skin and Subcutaneous Tissues:  Negative. No rash, ecchymoses, purpuric lesions noted.  Breasts:  Left breast with well healing scar.  Mild swelling lateral left breast. No erythema. Left axilla tender and healing scar. R breast normal appearing.  Psychiatric:  Alert and oriented.  Mood is normal.  No other symptoms.   Heme/Lymphatic/Immunologic:  No cervical or axillary adenopathy.   Upper Extremity Lymphedema: None.

## 2017-07-25 ENCOUNTER — Ambulatory Visit
Admission: RE | Admit: 2017-07-25 | Discharge: 2017-07-25 | Disposition: A | Discharge status: Home / Self Care | Payer: BC Managed Care – PPO

## 2017-07-25 DIAGNOSIS — C50412 Malignant neoplasm of upper-outer quadrant of left female breast: Principal | ICD-10-CM

## 2017-07-27 ENCOUNTER — Ambulatory Visit
Admission: RE | Admit: 2017-07-27 | Discharge: 2017-07-27 | Disposition: A | Discharge status: Home / Self Care | Payer: BC Managed Care – PPO

## 2017-07-27 DIAGNOSIS — C50412 Malignant neoplasm of upper-outer quadrant of left female breast: Principal | ICD-10-CM

## 2017-08-13 DIAGNOSIS — C50412 Malignant neoplasm of upper-outer quadrant of left female breast: Principal | ICD-10-CM

## 2017-08-14 ENCOUNTER — Encounter: Admit: 2017-08-14 | Discharge: 2017-08-15 | Payer: PRIVATE HEALTH INSURANCE

## 2017-08-14 DIAGNOSIS — C50412 Malignant neoplasm of upper-outer quadrant of left female breast: Principal | ICD-10-CM

## 2017-08-14 MED ORDER — OXYCODONE-ACETAMINOPHEN 5 MG-325 MG TABLET
ORAL_TABLET | Freq: Four times a day (QID) | ORAL | 0 refills | 0 days | Status: CP | PRN
Start: 2017-08-14 — End: 2017-08-21

## 2017-08-14 MED ORDER — DOCUSATE SODIUM 100 MG CAPSULE
ORAL_CAPSULE | Freq: Every day | ORAL | 0 refills | 0.00000 days | Status: CP
Start: 2017-08-14 — End: 2017-08-28

## 2017-08-14 MED ORDER — ONDANSETRON HCL 4 MG TABLET
ORAL_TABLET | Freq: Three times a day (TID) | ORAL | 0 refills | 0 days | Status: CP | PRN
Start: 2017-08-14 — End: 2017-08-28

## 2017-08-14 MED ORDER — SULFAMETHOXAZOLE 800 MG-TRIMETHOPRIM 160 MG TABLET
ORAL_TABLET | Freq: Two times a day (BID) | ORAL | 0 refills | 0.00000 days | Status: CP
Start: 2017-08-14 — End: 2017-08-24

## 2017-08-23 ENCOUNTER — Encounter
Admit: 2017-08-23 | Discharge: 2017-08-24 | Payer: PRIVATE HEALTH INSURANCE | Attending: Adult Health | Primary: Adult Health

## 2017-08-23 DIAGNOSIS — Z170 Estrogen receptor positive status [ER+]: Secondary | ICD-10-CM

## 2017-08-23 DIAGNOSIS — C50412 Malignant neoplasm of upper-outer quadrant of left female breast: Principal | ICD-10-CM

## 2017-08-28 ENCOUNTER — Encounter
Admit: 2017-08-28 | Discharge: 2017-08-29 | Payer: PRIVATE HEALTH INSURANCE | Attending: Adult Health | Primary: Adult Health

## 2017-08-28 DIAGNOSIS — C50412 Malignant neoplasm of upper-outer quadrant of left female breast: Principal | ICD-10-CM

## 2017-08-28 DIAGNOSIS — Z170 Estrogen receptor positive status [ER+]: Secondary | ICD-10-CM

## 2017-09-04 ENCOUNTER — Encounter
Admit: 2017-09-04 | Discharge: 2017-09-05 | Payer: PRIVATE HEALTH INSURANCE | Attending: Adult Health | Primary: Adult Health

## 2017-09-04 DIAGNOSIS — C50412 Malignant neoplasm of upper-outer quadrant of left female breast: Principal | ICD-10-CM

## 2017-09-04 DIAGNOSIS — Z170 Estrogen receptor positive status [ER+]: Secondary | ICD-10-CM

## 2017-09-10 ENCOUNTER — Encounter: Admit: 2017-09-10 | Discharge: 2017-09-11 | Payer: PRIVATE HEALTH INSURANCE

## 2017-09-10 DIAGNOSIS — C50412 Malignant neoplasm of upper-outer quadrant of left female breast: Principal | ICD-10-CM

## 2017-09-10 MED ORDER — SULFAMETHOXAZOLE 800 MG-TRIMETHOPRIM 160 MG TABLET
ORAL_TABLET | Freq: Two times a day (BID) | ORAL | 0 refills | 0 days | Status: CP
Start: 2017-09-10 — End: 2017-09-24

## 2017-09-14 MED ORDER — ONDANSETRON HCL 8 MG TABLET
ORAL_TABLET | Freq: Two times a day (BID) | ORAL | 2 refills | 0 days | Status: CP | PRN
Start: 2017-09-14 — End: 2018-02-27

## 2017-09-17 ENCOUNTER — Encounter
Admit: 2017-09-17 | Discharge: 2017-09-18 | Payer: PRIVATE HEALTH INSURANCE | Attending: Adult Health | Primary: Adult Health

## 2017-09-17 DIAGNOSIS — C50412 Malignant neoplasm of upper-outer quadrant of left female breast: Principal | ICD-10-CM

## 2017-09-17 DIAGNOSIS — Z170 Estrogen receptor positive status [ER+]: Secondary | ICD-10-CM

## 2017-09-21 ENCOUNTER — Ambulatory Visit: Admit: 2017-09-21 | Discharge: 2017-09-22 | Payer: PRIVATE HEALTH INSURANCE

## 2017-09-21 ENCOUNTER — Encounter: Admit: 2017-09-21 | Discharge: 2017-09-22 | Payer: PRIVATE HEALTH INSURANCE

## 2017-09-21 ENCOUNTER — Encounter
Admit: 2017-09-21 | Discharge: 2017-09-22 | Payer: PRIVATE HEALTH INSURANCE | Attending: Adult Health | Primary: Adult Health

## 2017-09-21 ENCOUNTER — Ambulatory Visit: Admit: 2017-09-21 | Discharge: 2017-09-22 | Payer: PRIVATE HEALTH INSURANCE | Attending: Family | Primary: Family

## 2017-09-21 DIAGNOSIS — Z170 Estrogen receptor positive status [ER+]: Secondary | ICD-10-CM

## 2017-09-21 DIAGNOSIS — C50412 Malignant neoplasm of upper-outer quadrant of left female breast: Principal | ICD-10-CM

## 2017-09-21 LAB — CBC W/ AUTO DIFF
BASOPHILS ABSOLUTE COUNT: 0 10*9/L (ref 0.0–0.1)
BASOPHILS RELATIVE PERCENT: 0.4 %
HEMATOCRIT: 42.1 % (ref 36.0–46.0)
HEMOGLOBIN: 14.2 g/dL (ref 12.0–16.0)
LARGE UNSTAINED CELLS: 2 % (ref 0–4)
LYMPHOCYTES ABSOLUTE COUNT: 3 10*9/L (ref 1.5–5.0)
LYMPHOCYTES RELATIVE PERCENT: 42.4 %
MEAN CORPUSCULAR HEMOGLOBIN CONC: 33.8 g/dL (ref 31.0–37.0)
MEAN CORPUSCULAR VOLUME: 85.7 fL (ref 80.0–100.0)
MEAN PLATELET VOLUME: 8.1 fL (ref 7.0–10.0)
MONOCYTES ABSOLUTE COUNT: 0.4 10*9/L (ref 0.2–0.8)
MONOCYTES RELATIVE PERCENT: 6.1 %
NEUTROPHILS ABSOLUTE COUNT: 3.3 10*9/L (ref 2.0–7.5)
NEUTROPHILS RELATIVE PERCENT: 47.1 %
PLATELET COUNT: 237 10*9/L (ref 150–440)
RED BLOOD CELL COUNT: 4.91 10*12/L (ref 4.00–5.20)
RED CELL DISTRIBUTION WIDTH: 13.1 % (ref 12.0–15.0)
WBC ADJUSTED: 7 10*9/L (ref 4.5–11.0)

## 2017-09-21 LAB — HEPATIC FUNCTION PANEL
ALBUMIN: 4.5 g/dL (ref 3.5–5.0)
ALKALINE PHOSPHATASE: 62 U/L (ref 38–126)
ALT (SGPT): 46 U/L (ref 15–48)
BILIRUBIN DIRECT: 0.2 mg/dL (ref 0.00–0.40)
BILIRUBIN TOTAL: 0.5 mg/dL (ref 0.0–1.2)
PROTEIN TOTAL: 6.7 g/dL (ref 6.5–8.3)

## 2017-09-21 LAB — PROTEIN TOTAL: Protein:MCnc:Pt:Ser/Plas:Qn:: 6.7

## 2017-09-21 LAB — LARGE UNSTAINED CELLS: Lab: 2

## 2017-09-21 MED ORDER — PEGFILGRASTIM-CBQV 6 MG/0.6 ML SUBCUTANEOUS SYRINGE
Freq: Once | SUBCUTANEOUS | 3 refills | 0.00000 days | Status: CP
Start: 2017-09-21 — End: 2017-09-21

## 2017-09-21 MED ORDER — PEGFILGRASTIM-CBQV 6 MG/0.6 ML SUBCUTANEOUS SYRINGE: mL | 3 refills | 0 days

## 2017-09-21 MED ORDER — PEGFILGRASTIM-CBQV 6 MG/0.6 ML SUBCUTANEOUS SYRINGE: 6 mg | mL | Freq: Once | 3 refills | 0 days | Status: AC

## 2017-09-21 NOTE — Unmapped (Signed)
It was a pleasure to see you today in the Breast Surgical Oncology Clinic. Please call my nurse navigator, Lindwood Qua, if you have any interval questions or concerns.     Follow-up in November with mammogram      Surgical oncology contact information:  For appointments, please call [phone].     Nurse Navigator available Monday through Friday 8:30 am to 4:30 pm:   Lindwood Qua, RN:    Phone: [phone]   Pager: [phone]; when prompted type in [phone] to access the appropriate person. You will be prompted to dial in your return phone number.   Fax: [phone] attention Vivia Budge    For emergencies, evenings or weekends, please call [phone] and ask for the surgical oncology resident on call. Please be aware that this person is also responding to in-hospital emergencies and patient issues and may not answer your phone call immediately, but will return your call as soon as possible.

## 2017-09-21 NOTE — Unmapped (Signed)
-----   Message from [facility], DO sent at 09/13/2017  3:37 PM EST -----  Regarding: RE: when can I start chemo?  I am hopeful in the next week or two. I drained over 300 cc of fluid on Monday.... Hopefully if we can compress the area and reduce the volume of fluid we can get the catheter out.     From my perspective, she could get chemo with a catheter in. I have no qualms with that. I don't know if IR will place a port if she has a seroma cath, but might be worth the ask.    KG    ----- Message -----  From: Willeen Niece, MD  Sent: 09/13/2017   9:11 AM  To: Kern Reap, RN, #  Subject: when can I start chemo?                          Hi Kristalyn,  This nice lady has recently had a seroma as you know, so we have pushed out her chemo. I am eager to get her started since she is node+ and now ~ 3 months from dx. When do you estimate that seroma catheter might come out? Patient also still needs port placement ( she had to push that off because of a biopsy her husband is having) so I'd like to try to line things up so that we are ready to go when the catheter comes out. Thoughts? Thanks  Alan Ripper

## 2017-09-21 NOTE — Unmapped (Signed)
Attending Attestation:   I have seen and evaluated the patient with the fellow or APP today and agree with the findings and plans as noted above.     Note-- we will use dose dense paclitaxel rather than weekly taxol d/t neuropathy  Addendum:   09/14/2017 I called the patient and given the delays from seroma catheter and inability to place port until that catheter is removed, we have agreed to start chemo with the taxane first and use AC second.  Humberto Seals MD

## 2017-09-21 NOTE — Unmapped (Signed)
Labs found to be within parameters for treatment today. Request for drug sent to pharmacy.

## 2017-09-21 NOTE — Unmapped (Signed)
Patient Name: Erika Cabrera  Patient Age: 62 y.o.  Encounter Date: 09/21/2017    Referring Physician:   Referred Self  No address on file    Primary Care Provider:  Cala Bradford, MD    Breast Cancer Consulting Physicians:  Surgical Oncology: Sherilyn Cooter, DO   Surgical Oncology Nurse Practitioner: Pollyann Samples, NP  Radiation Oncology: Lorrin Mais, MD  Medical Oncology: Marc Morgans, MD      Reason for Visit: Post-operative visit    Diagnosis:   1. Left breast infiltrating ductal carcinoma  Cancer Staging  Malignant neoplasm of upper-outer quadrant of left female breast (CMS-HCC)  Staging form: Breast, AJCC 8th Edition  - Clinical stage from 06/20/2017: Stage IIA (cT2, cN1(f), cM0, G1, ER: Positive, PR: Positive, HER2: Negative) - Signed by Talbert Cage, DO on 06/26/2017  - Pathologic stage from 07/13/2017: Stage IB (pT3, pN2a(sn), cM0, G2, ER: Positive, PR: Positive, HER2: Negative) - Signed by Rudie Meyer, ANP on 07/19/2017      Procedure:   1. Left [facility] localized, US guided partial mastectomy and left sentinel lymph node biopsy with targeted axillary lymph node excision, performed on 07/03/2017.  2. Left re-excision partial mastectomy and axillary lymph node dissection with axillary reverse mapping, performed on 08/14/2017.     Clinical Trial Participant:   1. No clinical trials at this time.    Interim History:  Ms.Cabrera returns to the office after the above noted procedure for her postoperative visit. She reports she is recovering well. Devora denies fevers, chills, bruising, redness and wound drainage postoperatively. Temima had a seroma catheter placed on 02/04, which was removed on 02/11.  She notes recurrence of the fluid in the axilla.  She continues on antibiotics.  She is due to start chemo today. No other specific complaints today.     Historically, Erika Cabrera presented with a self palpated left breast mass.     Pathology Review:  Final pathology revealed invasive mammary carcinoma with mixed ductal and lobular features present in association with in situ carcinoma with mixed ductal and lobular features, Nottingham grade 2, estrogen receptor 95% positive, progesterone receptor 30% positive, H2N FISH non-amplified measuring 8.3 cm in greatest dimension, final margins show invasive carcinoma present at the green inked lateral and yellow inked inferior margin(s). 5 of 6 sentinel lymph nodes demonstrated metastatic carcinoma, lymphovascular invasion was present, extracapsular extension was  absent and the largest focus of metastasis measured 10 mm.     On re-excision and ALND, pathology revealed: Lateral margin: Invasive carcinoma with mixed ductal and lobular features measuring 7mm w LVI, margin negative. Inferior margin: Multiple foci of lymphovasular invasion present.  No evidence of stromal invasion (final margin negative).  Left ALND: ??Two of twenty-three lymph nodes positive for carcinoma (2/23).  Size of larger tumor deposit: 6 mm.  Extracapsular extension: Absent  This was reviewed with Erika Cabrera. We discussed the significance of this finding at great length. She states her understanding of this pathology report.  A copy of the pathology report was provided to [facility].     Oncology History    2018: Left breast cT2 N1a, IDC, G1, +/+/-.        Malignant neoplasm of upper-outer quadrant of left female breast (CMS-HCC)    06/07/2017 -  Presenting Symptoms     Left breast palpable UOQ mass         06/07/2017 Interval Scan(s)     Diagnostic MMG at [facility]: left breast UOQ mass  difficult to measure, but appears to measure at least 5.6 cm.    [place]: left breast ill defined shadowing lesion at 2:00, 6 CFN measuring 3.7 x 2.6 x 2.5 cm.     Single mildly abnormal node in left axilla with constriction of fatty hilum.         06/13/2017 Biopsy     [facility] core biopsy of left breast 2:00, 6 CFN: IDC, G1, ER+(95%), PR+(30%), HER2 negative by FISH.  [facility] core biopsy left axillary LN: +LN metastasis.         06/13/2017 -  Other     Seen in [facility] ED following core biopsy in [facility] for axillary pain with non-expanding 4 cm hematoma noted in left axilla. No intervention required other than symptomatic measures (compression, pain management).         06/20/2017 Initial Diagnosis     Malignant neoplasm of upper-outer quadrant of left female breast (CMS-HCC)         06/20/2017 Tumor Board     MDC recs: left breast cT2 N1a IDC, G1, receptors pending. Per outside imaging there was a single abnormal LN found by [place] only. Awaiting receptors. Need SAVI into that clipped node. Discussed that she otherwise fits ACOSOG Z0011 criteria with negative clinical exam prior to biopsy and single abnormal LN on axillary [place]. Reasonable to do SLNB and excise biopsied node as well. If receptors favorable, consider surgery first approach, good BCT candidate. If high risk (TN, HER+) may need NACT approach. Briefly discussed ALTERNATE, but consensus was that she will likely be recommended for chemo given age and nodal positivity. Meeting surgery, rad/onc today. Will circle back with med/onc as needed.         07/03/2017 Surgery     Left [facility] localized/US guided partial mastectomy and left sentinel lymph node biopsy with targeted axillary lymph node excision: Invasive mammary carcinoma with mixed ductal and lobular features present in association with in situ carcinoma with mixed ductal and lobular features measuring 8.3 cm with positive inferior and lateral margin. Sentinel lymph node biopsy: 1/1 node positive for metastatic disease measuring 7 mm. No ECE. Palpable axillary lymph nodes: 4/4 lymph nodes with metastatic disease measuring 10 mm.  No ECE.  Additional palpable axillary lymph node 0/1 with isolated tumor cells. SAVI scout in the axilla was defective and there was no localization signal from the SAVI probe    ??    ??           08/14/2017 Surgery     Left re-excision, ALND w ARM: Lateral margin: Invasive carcinoma with mixed ductal and lobular features measuring 7mm w LVI, margin negative.   Inferior margin: Multiple foci of lymphovasular invasion present.  No evidence of stromal invasion (final margin negative)  Left ALND: ??Two of twenty-three lymph nodes positive for carcinoma (2/23).  Size of larger tumor deposit: 6 mm.  Extracapsular extension: Absent            Physical Exam:  Blood pressure 141/72, pulse 87, temperature 36.9 ??C (98.4 ??F), temperature source Oral, weight 97.9 kg (215 lb 14.4 oz), SpO2 96 %.  General Appearance: No acute distress, well appearing and well nourished.   Breast: The breasts are symmetrical. The left 2 o'clock incision and left axillary incision is clean, dry, intact and healing well.  There is no underlying hematoma or seroma in the breast.  There is a small seroma in the axilla.  73ml of milky fluid was aspirated and discarded.  Assessment:  Ms. Styles is a 61 y.o. female with a left breast infiltrating ductal carcinoma, estrogen receptor positive, progesterone receptor positive and HER2 negative, who is recovering well after left [facility] localized, US guided partial mastectomy and left sentinel lymph node biopsy with targeted axillary lymph node excision.  Cancer Staging  Malignant neoplasm of upper-outer quadrant of left female breast (CMS-HCC)  Staging form: Breast, AJCC 8th Edition  - Clinical stage from 06/20/2017: Stage IIA (cT2, cN1(f), cM0, G1, ER: Positive, PR: Positive, HER2: Negative) - Signed by Talbert Cage, DO on 06/26/2017  - Pathologic stage from 07/13/2017: Stage IB (pT3, pN2a(sn), cM0, G2, ER: Positive, PR: Positive, HER2: Negative) - Signed by Rudie Meyer, ANP on 07/19/2017      Plan:  Proceed with chemotherapy today as planned.   Add compression over the surgical site to help prevent recurrence of the seroma.  Reviewed s/sx of recurrence.  She will call if this occurs.   Follow-up in Nov with mammogram    At the conclusion of our visit all of Ruthene's questions and concerns were addressed and arrangements were made for follow up US discussed above. She was instructed call us with any further questions, concerns or issues.    Cancer Staging  Malignant neoplasm of upper-outer quadrant of left female breast (CMS-HCC)  Staging form: Breast, AJCC 8th Edition  - Clinical stage from 06/20/2017: Stage IIA (cT2, cN1(f), cM0, G1, ER: Positive, PR: Positive, HER2: Negative) - Signed by Talbert Cage, DO on 06/26/2017  - Pathologic stage from 07/13/2017: Stage IB (pT3, pN2a(sn), cM0, G2, ER: Positive, PR: Positive, HER2: Negative) - Signed by Rudie Meyer, ANP on 07/19/2017      Pollyann Samples, NP-C  Surgical Breast Oncology  09/21/2017  11:31 AM

## 2017-09-21 NOTE — Unmapped (Signed)
It was a pleasure seeing you today.   Continue with the current plan of care.  Follow-up in 2 weeks.    No orders of the defined types were placed in this encounter.      Labs from today if done:  Lab on 09/21/2017   Component Date Value   ??? Albumin 09/21/2017 4.5    ??? Total Protein 09/21/2017 6.7    ??? Total Bilirubin 09/21/2017 0.5    ??? Bilirubin, Direct 09/21/2017 0.20    ??? AST 09/21/2017 34    ??? ALT 09/21/2017 46    ??? Alkaline Phosphatase 09/21/2017 62    ??? WBC 09/21/2017 7.0    ??? RBC 09/21/2017 4.91    ??? HGB 09/21/2017 14.2    ??? HCT 09/21/2017 42.1    ??? MCV 09/21/2017 85.7    ??? MCH 09/21/2017 28.9    ??? MCHC 09/21/2017 33.8    ??? RDW 09/21/2017 13.1    ??? MPV 09/21/2017 8.1    ??? Platelet 09/21/2017 237    ??? Neutrophils % 09/21/2017 47.1    ??? Lymphocytes % 09/21/2017 42.4    ??? Monocytes % 09/21/2017 6.1    ??? Eosinophils % 09/21/2017 2.1    ??? Basophils % 09/21/2017 0.4    ??? Absolute Neutrophils 09/21/2017 3.3    ??? Absolute Lymphocytes 09/21/2017 3.0    ??? Absolute Monocytes 09/21/2017 0.4    ??? Absolute Eosinophils 09/21/2017 0.1    ??? Absolute Basophils 09/21/2017 0.0    ??? Large Unstained Cells 09/21/2017 2         Future Appointments:  Future Appointments  Date Time Provider Department Center   09/21/2017 2:00 PM ONCINF CHAIR 14 [id number]   09/28/2017 10:00 AM HBR VIR RM 1 [id number] [place] [facility]     To try and prevent neuropathy in your hands and feet:  - To lower chances of nerve damage in fingers and toes you can wear cold gloves and socks on your hands and feet on days you are receiving paclitaxel (Taxol). Can also use ice in Ziploc bags and other products like CryoMax.   - Wear for 15 minutes prior to paclitaxel infusion, during treatment, and for 15 minutes after completion of infusion.  - From a patient who used CryoMax: I have been using small ice packs that fold over my toes and fingers during infusions. The ice packs are called CryoMax. They stay frozen for eight hours with no dripping, sweating, or refreezing. I bought them through [employer]. The size I bought were 6 inch x 6 inch squares. There are 2 ice packs per box, so I bought 2 boxes (total of 4 ice packs) to cover both feet and both hands. The total cost was about $20. The infusion nurses loved them because they didn't have to replace any ice or clean any wet areas. In fact, the infusion center is considering buying them for all their patients so the nurses don't have to deal with hauling ice and mopping up when ice melts. You will have to specify the CryoMax brand when you search on [employer]. Hope this helps. Oh, just wear a pair of regular socks while icing.      For appointments call [phone]  For new symptoms or health related issues, please call   Nurse Navigator: [name], RN [phone]  On weekends and after hours on weekdays, please call [phone] for the oncology fellow on call.      If you use [facility],  please know that I check messages only twice a week. If your message is urgent, please call or page your nurse navigator or the on-call fellow.

## 2017-09-21 NOTE — Unmapped (Signed)
Per test claim for Udenyca at the [facility], patient needs Medication Assistance Program for Prior Authorization.

## 2017-09-22 NOTE — Unmapped (Signed)
Breast Cancer New Patient Evaluation:     Referring Physician: Cala Bradford, Md  [address]  [facility]  [place], [place] [address]. [phone]    PCP: Stacie Acres WHITE, MD    Assessment/Plan:  I spent at least 40 minutes with this patient more than 50% in counseling. The following issues were discussed:  A 61yo woman with a Left breast pT3pN2a, IDC, G2, ER+(95%)/PR+(30%)/HER2-neg, 5/6 LN positive, +LVI and positive surgical margins s/p BCT with targeted axillary LN bx on 07/03/17. Unfortunately her pathology revealed R1 resection and she requires re-excision with ALND. Completed this on 08/14/17 showing 7 mm of residual tumor, negative margins, 2/23 +LN with larger tumor deposit of 6 mm. Planning for ddAC-T but she has pre-exsisting neuropathy which makes use of weekly paclitaxel challenging so would try q 2 week taxol.  She is postmenopausal so will plan for an aromatase inhibitor for her HR+ diease.     - Completed re-excision/ALND 08/14/17, now cleared by surgery   - Start C1 ddTaxol x 4, followed by ddAC x4C, labs adequate  - Insurance declined OBI Neulasta, approved for Udenyca and will have these injections at [facility] ~24 hours after chemo with every cycle  - Antiestrogen therapy (an AI) for 5-54yrs  - CT-CAP 07/25/17 NED  - Echo 07/27/17 EF >55%  - Will get port placed 09/28/17    ADDENDUM: hypersensitive reaction 20 minutes into Taxol infusion. Able to complete with slower rate and emergency rescue medications given. Consider Abraxane.    Reason for Visit: A 62 y.o. female with breast cancer referred for consultation by Cala Bradford, Md  [address]  [facility]  [place], [place] [address] for recommendations concerning the management of breast cancer.    She initially presented with self-palpated left breast mass and workup/surgery revealed an 8.3cm tumor HR+/HER2-neg with positive LNs. She is recovering well from surgery.  Drains removed a week ago.She has some pain on the left side of her breast and pain plus numbness down inner side of her left arm. She reports that she has baseline neuropathy in her feet and is followed by a Neuroloogist in [place].  She does not have diabetes and states that the neuropathy is due to structural issues with her feet and high arches and runs in her family. She has no history of heart issues, never had an MI or heart failure. She recently moved to [facility] from [place].  She was laid off from her job and was working part-time but could not qualify for [employer] on the open market so she had to quit her part-time job.  States she would appreciate seeing a Artist to figure out how to pay for all this.    Interval History:  Here with husband.  Cleared by surgery to continue on to chemo.  Still taking Bactrim ABX-will complete this on Monday after 2 weeks of Tx for persistent seroma.   Ready for C1 ddTaxol.  Still plagued by persistent chronic neuropathy 2/2 chronic hammer toes and high arches.   Cannot walk barefoot-neuropathy is located primarily on the balls of both feet.   Performance status=0    Breast Cancer History:   Oncology History    2018: Left breast cT2 N1a, IDC, G1, +/+/-.        Malignant neoplasm of upper-outer quadrant of left female breast (CMS-HCC)    06/07/2017 -  Presenting Symptoms     Left  breast palpable UOQ mass         06/07/2017 Interval Scan(s)     Diagnostic MMG at [facility]: left breast UOQ mass difficult to measure, but appears to measure at least 5.6 cm.    [place]: left breast ill defined shadowing lesion at 2:00, 6 CFN measuring 3.7 x 2.6 x 2.5 cm.     Single mildly abnormal node in left axilla with constriction of fatty hilum.         06/13/2017 Biopsy     [facility] core biopsy of left breast 2:00, 6 CFN: IDC, G1, ER+(95%), PR+(30%), HER2 negative by FISH.  [facility] core biopsy left axillary LN: +LN metastasis.         06/13/2017 -  Other     Seen in [facility] ED following core biopsy in [facility] for axillary pain with non-expanding 4 cm hematoma noted in left axilla. No intervention required other than symptomatic measures (compression, pain management).         06/20/2017 Initial Diagnosis     Malignant neoplasm of upper-outer quadrant of left female breast (CMS-HCC)         06/20/2017 Tumor Board     [facility] recs: left breast cT2 N1a IDC, G1, receptors pending. Per outside imaging there was a single abnormal LN found by [place] only. Awaiting receptors. Need SAVI into that clipped node. Discussed that she otherwise fits ACOSOG Z0011 criteria with negative clinical exam prior to biopsy and single abnormal LN on axillary [place]. Reasonable to do SLNB and excise biopsied node as well. If receptors favorable, consider surgery first approach, good BCT candidate. If high risk (TN, HER+) may need NACT approach. Briefly discussed ALTERNATE, but consensus was that she will likely be recommended for chemo given age and nodal positivity. Meeting surgery, rad/onc today. Will circle back with med/onc as needed.         07/03/2017 Surgery     Left [facility] localized/US guided partial mastectomy and left sentinel lymph node biopsy with targeted axillary lymph node excision: Invasive mammary carcinoma with mixed ductal and lobular features present in association with in situ carcinoma with mixed ductal and lobular features measuring 8.3 cm with positive inferior and lateral margin. Sentinel lymph node biopsy: 1/1 node positive for metastatic disease measuring 7 mm. No ECE. Palpable axillary lymph nodes: 4/4 lymph nodes with metastatic disease measuring 10 mm.  No ECE.  Additional palpable axillary lymph node 0/1 with isolated tumor cells. SAVI scout in the axilla was defective and there was no localization signal from the SAVI probe    ??    ??           08/14/2017 Surgery     Left re-excision, ALND w ARM: Lateral margin: Invasive carcinoma with mixed ductal and lobular features measuring 7mm w LVI, margin negative. Inferior margin: Multiple foci of lymphovasular invasion present.  No evidence of stromal invasion (final margin negative)  Left ALND: ??Two of twenty-three lymph nodes positive for carcinoma (2/23).  Size of larger tumor deposit: 6 mm.  Extracapsular extension: Absent         09/21/2017 -  Chemotherapy     Started ddTaxol--->ddAC         09/21/2017 Adverse Reaction     First Taxol infused for ~20 minutes resulting in flushing with chest tightness and nausea.  Infusion was stopped and administered.  Symptoms subsided within 20 minutes but she continued to feel some chest tightness with deep inspiration.  Albuterol nebulizer was  given.  She then returned to baseline and infusion was restarted at 1/2 rate and titrated up slowly.  She was able to complete infusion without any further events.                 Past Medical History:  Patient Active Problem List   Diagnosis   ??? Malignant neoplasm of upper-outer quadrant of left female breast (CMS-HCC)       Gyn History:   LMP 2012    Surgical History:   Past Surgical History:   Procedure Laterality Date   ??? ANAL FISSURECTOMY     ??? BREAST BIOPSY Left 06/2017    malignant   ??? BREAST BIOPSY Left 06/2017    Lympnode bx-positive   ??? PR BX/REMV,LYMPH NODE,DEEP AXILL Left 07/03/2017    Procedure: BX/EXC LYMPH NODE; OPEN, DEEP AXILRY NODE;  Surgeon: Talbert Cage, DO;  Location: ASC OR [facility];  Service: Surgical Oncology   ??? PR INTRAOPERATIVE SENTINEL LYMPH NODE ID W DYE INJECTION Left 07/03/2017    Procedure: INTRAOPERATIVE IDENTIFICATION SENTINEL LYMPH NODE(S) INCLUDE INJECTION NON-RADIOACTIVE DYE, WHEN PERFORMED;  Surgeon: Talbert Cage, DO;  Location: ASC [facility];  Service: Surgical Oncology   ??? PR INTRAOPERATIVE SENTINEL LYMPH NODE ID W DYE INJECTION Left 08/14/2017    Procedure: INTRAOPERATIVE IDENTIFICATION SENTINEL LYMPH NODE(S) INCLUDE INJECTION NON-RADIOACTIVE DYE, WHEN PERFORMED;  Surgeon: Talbert Cage, DO;  Location: ASC OR [facility]; Service: Surgical Oncology Breast   ??? PR MASTECTOMY, PARTIAL Left 07/03/2017    Procedure: MASTECTOMY, savi scout PARTIAL (EG, LUMPECTOMY, TYLECTOMY, QUADRANTECTOMY, SEGMENTECTOMY);  Surgeon: Talbert Cage, DO;  Location: ASC OR [facility];  Service: Surgical Oncology   ??? PR MASTECTOMY, PARTIAL Left 08/14/2017    Procedure: MASTECTOMY, PARTIAL (EG, LUMPECTOMY, TYLECTOMY, QUADRANTECTOMY, SEGMENTECTOMY) Re-excision;  Surgeon: Talbert Cage, DO;  Location: ASC OR [facility];  Service: Surgical Oncology Breast   ??? PR REMOVE ARMPITS LYMPH NODES COMPLT Left 08/14/2017    Procedure: AXILLARY LYMPHADENECTOMY; COMPLETE;  Surgeon: Talbert Cage, DO;  Location: ASC OR [facility];  Service: Surgical Oncology Breast       Medications:  Current Outpatient Prescriptions   Medication Sig Dispense Refill   ??? acetaminophen (TYLENOL) 500 MG tablet Take 1,000 mg by mouth every four (4) hours as needed for pain.     ??? buPROPion (WELLBUTRIN XL) 300 MG 24 hr tablet Take 300 mg by mouth daily.     ??? docusate sodium (COLACE) 100 MG capsule Take 100 mg by mouth daily with breakfast.     ??? FLUoxetine (PROZAC) 10 MG tablet Take 10 mg by mouth daily.     ??? GABAPENTIN ORAL Take 900 mg by mouth nightly. Pt taking 1 capsule at noon and 3 capsules at 8PM     ??? multivit with iron,hematinic (SUPER B-COMPLEX ORAL) Take 1 capsule by mouth daily with breakfast.     ??? ondansetron (ZOFRAN) 8 MG tablet Take 1 tablet (8 mg total) by mouth every twelve (12) hours as needed for nausea. 30 tablet 2   ??? oxyCODONE (OXY-IR) 5 mg capsule Take 1 capsule (5 mg total) by mouth every four (4) hours as needed for pain. 20 capsule 0   ??? pegfilgrastim-cbqv (UDENYCA) 6 mg/0.6 mL injection Inject 0.6 mL (6 mg total) under the skin once. for 1 dose 24 hours after chemotherapy. Chemo cycle is q2weeks 1.2 mL 3   ??? ranitidine (ZANTAC) 150 MG tablet Take 150 mg by mouth daily.      ??? sulfamethoxazole-trimethoprim (  BACTRIM DS) 800-160 mg per tablet Take 1 tablet (160 mg of trimethoprim total) by mouth Two (2) times a day. for 14 days 28 tablet 0     No current facility-administered medications for this visit.      Facility-Administered Medications Ordered in Other Visits   Medication Dose Route Frequency Provider Last Rate Last Dose   ??? dexamethasone (DECADRON) 4 mg/mL injection 20 mg  20 mg Intravenous Once PRN Willeen Niece, MD       ??? dexamethasone (DECADRON) 4 mg/mL injection 20 mg  20 mg Intravenous Once PRN Willeen Niece, MD       ??? diphenhydrAMINE (BENADRYL) injection 25 mg  25 mg Intravenous Once PRN Willeen Niece, MD       ??? diphenhydrAMINE (BENADRYL) injection 25 mg  25 mg Intravenous Once PRN Willeen Niece, MD   25 mg at 09/21/17 1623   ??? EPINEPHrine (EPIPEN) injection 0.3 mg  0.3 mg Intramuscular Once PRN Willeen Niece, MD       ??? famotidine (PF) (PEPCID) injection 20 mg  20 mg Intravenous Once PRN Willeen Niece, MD   20 mg at 09/21/17 1623   ??? meperidine (DEMEROL) injection 25 mg  25 mg Intravenous Once PRN Willeen Niece, MD       ??? methylPREDNISolone sodium succinate (PF) (Solu-MEDROL) injection 125 mg  125 mg Intravenous Once PRN Willeen Niece, MD   125 mg at 09/21/17 1622   ??? OKAY TO SEND MEDICATION/CHEMOTHERAPY TO UNIT   Other Once Willeen Niece, MD       ??? sodium chloride (NS) 0.9 % flush 10 mL  10 mL Intravenous Q8H PRN Marcy Panning, FNP   10 mL at 09/21/17 1203   ??? sodium chloride (NS) 0.9 % flush 10 mL  10 mL Intravenous Q30 Min PRN Marcy Panning, [place]       ??? sodium chloride (NS) 0.9 % flush 10 mL  10 mL Intravenous Q8H PRN Marcy Panning, FNP       ??? sodium chloride (NS) 0.9 % flush 10 mL  10 mL Intravenous Q30 Min PRN Marcy Panning, FNP       ??? sodium chloride (NS) 0.9 % infusion  100 mL/hr Intravenous Continuous Willeen Niece, MD   Stopped at 09/21/17 2020   ??? sodium chloride (NS) 0.9 % infusion  20 mL/hr Intravenous Continuous PRN Willeen Niece, MD       ??? sodium chloride 0.9% (NS BOLUS) bolus 1,000 mL  1,000 mL Intravenous Once PRN Willeen Niece, MD   Stopped at 09/21/17 1727       Allergies:  Allergies   Allergen Reactions   ??? Erythromycin Diarrhea   ??? Penicillins Rash       Personal and Social History:  Social History     Social History   ??? Marital status: Married     Spouse name: N/A   ??? Number of children: N/A   ??? Years of education: N/A     Occupational History   ??? Not on file.     Social History Main Topics   ??? Smoking status: Never Smoker   ??? Smokeless tobacco: Never Used   ??? Alcohol use No   ??? Drug use: No   ??? Sexual activity: Not on file     Other Topics Concern   ??? Not on file     Social History Narrative    The  patient is married. She lives at home with her husband, Ed.  She has two children       Family History:  Cancer-related family history includes Breast cancer in her paternal aunt. There is no history of Cancer or Ovarian cancer.  indicated that the status of her mother is unknown. She indicated that the status of her father is unknown. She indicated that the status of her sister is unknown. She indicated that the status of her maternal grandmother is unknown. She indicated that the status of her maternal grandfather is unknown. She indicated that the status of her paternal grandmother is unknown. She indicated that the status of her paternal grandfather is unknown. She indicated that the status of her daughter is unknown. She indicated that the status of her neg hx is unknown.       Review of Systems: A complete review of systems was obtained including: Constitutional, Eyes, ENT, Cardiovascular, Respiratory, GI, GU, Musculoskeletal, Skin, Neurological, Psychiatric, Endocrine, Heme/Lymphatic, and Allergic/Immunologic systems. It is negative or non-contributory to the patient???s management except for the following: None    Physical Examination:  Vital Signs: Ht 171 cm (5' 7.32)  - BMI 33.49 kg/m??   General: Healthy-appearing female in no acute distress..  Cardiovascular:  Heart not clinically enlarged.  No significant murmurs or gallops.  No lower extremity edema.    Respiratory:  Chest clear to percussion and auscultation.   Gastrointestinal:  Abdomen soft without masses and tenderness, no hepatosplenomegaly or other masses.   Musculoskeletal:  No bony pain or tenderness.   Skin and Subcutaneous Tissues:  Negative. No rash, ecchymoses, purpuric lesions noted.  Breasts:  Left breast with well healing scar.  Mild swelling lateral left breast. Resolving erythema, no fluctuance. Left axilla tender and healing scar. R breast normal appearing.  Psychiatric:  Alert and oriented.  Mood is normal.  No other symptoms.   Heme/Lymphatic/Immunologic:  No cervical or axillary adenopathy.   Upper Extremity Lymphedema: None.

## 2017-09-22 NOTE — Unmapped (Signed)
1800 received patient from [place], Charity fundraiser. Pt is tolerating chemo infusion.   2020 pt completed treatment. PIV was flushed and removed. Pt was given a copy of AVS and was discharged from clinic.

## 2017-09-22 NOTE — Unmapped (Signed)
Infusion Progress Note    Patient Name: Erika Cabrera  Patient Age: 62 y.o.  Encounter Date: 09/21/2017  Allergies   Allergen Reactions   ??? Erythromycin Diarrhea   ??? Penicillins Rash       Reason for visit  Infusion Center visit   Today is day 1/cycle 1 of taxol therapy.   Chief Complaint: flushing and chest tightness    Assessment/Plan:  Hypersensitivity reaction Grade 2 2/2 taxol    -premedications: dexamethasone 20mg  po, benadyrl 25mg  PO, and pepcid 20mg  IV  -symptoms: flushing, chest tightness, and vomiting  -amount infused: 61ml  -reaction medications: benadryl 25mg  IV, solu-medrol 125mg  IV, pepcid 20mg  IV, and zofran 8mg  IV.  Albuterol neb was given for continued chest tightness.  -disposition: OK to restart infusion 30 min after symptoms resolve at half rate and titrate slowly to full rate.    Interval History/HPI:   Erika Cabrera is a 62 yo patient of Dr. Pamelia Hoit, NP with newly diagnosed breast cancer that presents to the infusion center for her first day of chemotherapy.  Taxol had been infusing for ~20 minutes when she began to have flushing with chest tightness and nausea.  Infusion was stopped and emergency medications were administered.  Symptoms subsided within 20 minutes but she continued to feel some chest tightness with deep inspiration.  Albuterol nebulizer was given.  She then returned to baseline and infusion was restarted at 1/2 rate and titrated up slowly.  She was able to complete infusion without any further events.     ROS:  All other systems reviewed were negative    Oncology History    2018: Left breast cT2 N1a, IDC, G1, +/+/-.        Malignant neoplasm of upper-outer quadrant of left female breast (CMS-HCC)    06/07/2017 -  Presenting Symptoms     Left breast palpable UOQ mass         06/07/2017 Interval Scan(s)     Diagnostic MMG at [facility]: left breast UOQ mass difficult to measure, but appears to measure at least 5.6 cm.    [place]: left breast ill defined shadowing lesion at 2:00, 6 CFN measuring 3.7 x 2.6 x 2.5 cm.     Single mildly abnormal node in left axilla with constriction of fatty hilum.         06/13/2017 Biopsy     [facility] core biopsy of left breast 2:00, 6 CFN: IDC, G1, ER+(95%), PR+(30%), HER2 negative by FISH.  [facility] core biopsy left axillary LN: +LN metastasis.         06/13/2017 -  Other     Seen in [facility] ED following core biopsy in [facility] for axillary pain with non-expanding 4 cm hematoma noted in left axilla. No intervention required other than symptomatic measures (compression, pain management).         06/20/2017 Initial Diagnosis     Malignant neoplasm of upper-outer quadrant of left female breast (CMS-HCC)         06/20/2017 Tumor Board     MDC recs: left breast cT2 N1a IDC, G1, receptors pending. Per outside imaging there was a single abnormal LN found by [place] only. Awaiting receptors. Need SAVI into that clipped node. Discussed that she otherwise fits ACOSOG Z0011 criteria with negative clinical exam prior to biopsy and single abnormal LN on axillary [place]. Reasonable to do SLNB and excise biopsied node as well. If receptors favorable, consider surgery first approach, good BCT candidate. If high risk (  TN, HER+) may need NACT approach. Briefly discussed ALTERNATE, but consensus was that she will likely be recommended for chemo given age and nodal positivity. Meeting surgery, rad/onc today. Will circle back with med/onc as needed.         07/03/2017 Surgery     Left [facility] localized/US guided partial mastectomy and left sentinel lymph node biopsy with targeted axillary lymph node excision: Invasive mammary carcinoma with mixed ductal and lobular features present in association with in situ carcinoma with mixed ductal and lobular features measuring 8.3 cm with positive inferior and lateral margin. Sentinel lymph node biopsy: 1/1 node positive for metastatic disease measuring 7 mm. No ECE. Palpable axillary lymph nodes: 4/4 lymph nodes with metastatic disease measuring 10 mm.  No ECE.  Additional palpable axillary lymph node 0/1 with isolated tumor cells. SAVI scout in the axilla was defective and there was no localization signal from the SAVI probe    ??    ??           08/14/2017 Surgery     Left re-excision, ALND w ARM: Lateral margin: Invasive carcinoma with mixed ductal and lobular features measuring 7mm w LVI, margin negative.   Inferior margin: Multiple foci of lymphovasular invasion present.  No evidence of stromal invasion (final margin negative)  Left ALND: ??Two of twenty-three lymph nodes positive for carcinoma (2/23).  Size of larger tumor deposit: 6 mm.  Extracapsular extension: Absent          Physical Exam:  Vital Signs:  Wt Readings from Last 3 Encounters:   09/21/17 97.9 kg (215 lb 14.4 oz)   09/21/17 97.7 kg (215 lb 6.2 oz)   09/17/17 96.9 kg (213 lb 11.2 oz)     Temp Readings from Last 3 Encounters:   09/21/17 36.9 ??C (98.4 ??F) (Oral)   09/21/17 36.8 ??C (98.2 ??F) (Oral)   09/17/17 36.9 ??C (98.4 ??F)     BP Readings from Last 3 Encounters:   09/21/17 141/72   09/21/17 119/59   09/17/17 130/76     Pulse Readings from Last 3 Encounters:   09/21/17 87   09/21/17 75   09/17/17 100     General:  Healthy-appearing.  Distress noted with flushing and vomiting.  HEENT: Normocephalic and atraumatic  Cardiovascular:  Regular rate, & rhythm. S1/S2 No significant murmurs or gallops  Respiratory: Bilateral breath sounds clear to auscultation without adventitious sounds.  No increased work of breathing noted.   Skin: grossly intact, warm/dry  Neuro: alert, oriented X3, stable gait, strength equal bilaterally    Results:  Recent Labs      09/21/17   1158   WBC  7.0   HGB  14.2   HCT  42.1   PLT  237   NEUTROABS  3.3   LYMPHSABS  3.0   MONOSABS  0.4   EOSABS  0.1   BASOSABS  0.0   LUC  2     Recent Labs      09/21/17   1158   ALBUMIN  4.5   PROT  6.7   BILITOT  0.5   AST  34   ALT  46   ALKPHOS  62

## 2017-09-22 NOTE — Unmapped (Signed)
Terrence comes for C1D1 Paclitaxel. A&O*4. VSS. Afebrile. Endorses neuropathy in toes at baseline. Otherwise asymptomatic. RPIV#22 patent, flushing, +BR. Labs within treatment parameters. Dosing within 10% variance. Pre-medicated. IVF started. Patient comfortable. Pharmacy at chairside for education.  @1603  treatment started.  @1622  patient reports chest pressure and nausea. Infusion stopped. APP paged. IV Solu-medrol, IV Benadryl, IV Pepcid given. IVF bolus stared via new line. APP at chairside. Patient vomited.  IV Zofran given for nausea.   @1630  patient feeling better ex some residual chest tightness. VSS. Afebrile.   @1659  albuterol neb treatment given.   @1727  patient stable and now asymptomatic. Paclitaxel restarted at half rate (101 ml/hr) per APP. Monitoring closely.  @1745  patient remains asymptomatic. Paclitaxel titrated to 75% of original rate (151 ml/hr). Monitoring closely.  @1802  patient remains asymptomatic. Paclitaxel titrated to full rate (200.9 ml/hr).   @1811   Patient transferred to late section in stable condition. Report to and handoff with Amy, RN.

## 2017-09-22 NOTE — Unmapped (Addendum)
If you feel like this is an emergency please call 911.  For appointments or questions Monday through Friday 8AM-5PM please call [phone] or Toll Free [phone]. For Medical questions or concerns ask for the Nurse Triage Line.  On Nights, Weekends, and Holidays call [phone] and ask for the Oncologist on Call.  Reasons to call the Nurse Triage Line:  Fever of 100.5 or greater  Nausea and/or vomiting not relieved with nausea medicine  Diarrhea or constipation  Severe pain not relieved with usual pain regimen  Shortness of breath  Uncontrolled bleeding  Mental status changes

## 2017-09-23 ENCOUNTER — Ambulatory Visit: Admit: 2017-09-23 | Discharge: 2017-09-24 | Payer: PRIVATE HEALTH INSURANCE

## 2017-09-23 DIAGNOSIS — C50412 Malignant neoplasm of upper-outer quadrant of left female breast: Principal | ICD-10-CM

## 2017-09-23 NOTE — Unmapped (Signed)
0981 Pt here for udenyca injection per MD order. Pt resting in chair, call light in reach. 1004 injection given per orders, which pt tolerated well. Pt left ambulatory at 1008.

## 2017-09-24 NOTE — Unmapped (Signed)
PA approved for Udenyca from 09/21/17 to 09/24/18 for $0 with copay assistance.

## 2017-09-26 NOTE — Unmapped (Signed)
Pre-op complete, instructed to take am meds.

## 2017-09-28 ENCOUNTER — Encounter: Admit: 2017-09-28 | Discharge: 2017-09-28 | Payer: PRIVATE HEALTH INSURANCE

## 2017-09-28 DIAGNOSIS — C50919 Malignant neoplasm of unspecified site of unspecified female breast: Principal | ICD-10-CM

## 2017-09-28 NOTE — Unmapped (Signed)
[facility]   POST-PROCEDURE NOTE       Procedure Name: IR INSERT PORT AGE GREATER THAN 5 YRS [[id number]]    Pre-Op Diagnosis: Left breast cancer    Post-Op Diagnosis: Same as pre-operative diagnosis    VIR Providers    Attending: Dr. Rush Barer    Description of procedure: Successful placement of single lumen power port via the right internal jugular vein.     Sedation: Moderate    Estimated Blood Loss: approximately 1 mL  Specimens: None   Contrast: 0 mL  Complications: None      See detailed procedure note with images in PACS (IMPAX).    The patient tolerated the procedure well without incident or complication and was returned to the PRU in stable condition.    Rush Barer, MD    09/28/2017 10:29 AM

## 2017-09-28 NOTE — Unmapped (Signed)
[facility] INTERVENT[facility]   H&P NOTE       Assessment/Plan:    Erika Cabrera is a 62 y.o. female who will undergo single lumen port placement in Interventional Radiology.    --This procedure has been fully reviewed with the patient/patient???s authorized representative. The risks, benefits and alternatives have been explained, and the patient/patient???s authorized representative has consented to the procedure.  --The patient will accept blood products in an emergent situation.  --The patient does not have a Do Not Resuscitate order in effect.          HPI: Erika Cabrera is a 62 y.o. female with left breast cancer to undergo single lumen port placement.     Allergies:   Allergies   Allergen Reactions   ??? Erythromycin Diarrhea   ??? Penicillins Rash       Medications:  No relevant medications, please see full medication list in Epic.    ASA Grade: ASA 2 - Patient with mild systemic disease with no functional limitations    PE:    Vitals:    09/28/17 0918   BP: 149/89   Pulse: 113   Resp: 20   Temp: 37.3 ??C (99.1 ??F)   SpO2: 98%     General: female in NAD.  Airway assessment: Class 3 - Can visualize soft palate  Lungs: Respirations nonlabored        Rush Barer, MD  09/28/2017 9:26 AM

## 2017-10-01 NOTE — Unmapped (Signed)
Followed up with Erika Cabrera today after being routed a mychart msg she sent to surgery oncology.  She claimed her seroma has recurred.  She has consistently applyied ice and compression without relief.  She is also concerned about her port site.  She stated when she showered this morning she saw a red inflamed area where she removed the dressing from her chest.  She denies fever, pain, or drainage.  She asked about an appt with Pollyann Samples in [facility] tomorrow.  Appt request sent to the scheduler.  Pt advised to call on call number for any signs or symptoms of infection to her port site in the future.

## 2017-10-02 ENCOUNTER — Encounter
Admit: 2017-10-02 | Discharge: 2017-10-03 | Payer: PRIVATE HEALTH INSURANCE | Attending: Adult Health | Primary: Adult Health

## 2017-10-02 DIAGNOSIS — C50412 Malignant neoplasm of upper-outer quadrant of left female breast: Principal | ICD-10-CM

## 2017-10-02 DIAGNOSIS — Z170 Estrogen receptor positive status [ER+]: Secondary | ICD-10-CM

## 2017-10-02 NOTE — Unmapped (Signed)
Patient Name: Erika Cabrera  Patient Age: 62 y.o.  Encounter Date: 10/02/2017    Referring Physician:   Referred Self  No address on file    Primary Care Provider:  Cala Bradford, MD    Breast Cancer Consulting Physicians:  Surgical Oncology: Sherilyn Cooter, DO   Surgical Oncology Nurse Practitioner: Pollyann Samples, NP  Radiation Oncology: Lorrin Mais, MD  Medical Oncology: Marc Morgans, MD      Reason for Visit: Post-operative visit    Diagnosis:   1. Left breast infiltrating ductal carcinoma  Cancer Staging  Malignant neoplasm of upper-outer quadrant of left female breast (CMS-HCC)  Staging form: Breast, AJCC 8th Edition  - Clinical stage from 06/20/2017: Stage IIA (cT2, cN1(f), cM0, G1, ER: Positive, PR: Positive, HER2: Negative) - Signed by Talbert Cage, DO on 06/26/2017  - Pathologic stage from 07/13/2017: Stage IB (pT3, pN2a(sn), cM0, G2, ER: Positive, PR: Positive, HER2: Negative) - Signed by Rudie Meyer, ANP on 07/19/2017      Procedure:   1. Left [facility] localized, US guided partial mastectomy and left sentinel lymph node biopsy with targeted axillary lymph node excision, performed on 07/03/2017.  2. Left re-excision partial mastectomy and axillary lymph node dissection with axillary reverse mapping, performed on 08/14/2017.     Clinical Trial Participant:   1. No clinical trials at this time.    Interim History:  Erika Cabrera returns to the office after the above noted procedure for her postoperative visit. She reports she is recovering well. Erika Cabrera denies fevers, chills, bruising, redness and wound drainage postoperatively. Erika Cabrera had a seroma catheter placed on 02/04, which was removed on 02/11.  She notes recurrence of the fluid in the axilla.  She reports she noticed this after her chemotherapy infusion.  She did have an infusion reaction that involved forceful vomiting and urinary incontinence. She inquires if her PAC is healing normally. No other specific complaints today.     Historically, Erika Cabrera presented with a self palpated left breast mass.     Pathology Review:  Final pathology revealed invasive mammary carcinoma with mixed ductal and lobular features present in association with in situ carcinoma with mixed ductal and lobular features, Nottingham grade 2, estrogen receptor 95% positive, progesterone receptor 30% positive, H2N FISH non-amplified measuring 8.3 cm in greatest dimension, final margins show invasive carcinoma present at the green inked lateral and yellow inked inferior margin(s). 5 of 6 sentinel lymph nodes demonstrated metastatic carcinoma, lymphovascular invasion was present, extracapsular extension was  absent and the largest focus of metastasis measured 10 mm.     On re-excision and ALND, pathology revealed: Lateral margin: Invasive carcinoma with mixed ductal and lobular features measuring 7mm w LVI, margin negative. Inferior margin: Multiple foci of lymphovasular invasion present.  No evidence of stromal invasion (final margin negative).  Left ALND: ??Two of twenty-three lymph nodes positive for carcinoma (2/23).  Size of larger tumor deposit: 6 mm.  Extracapsular extension: Absent  This was reviewed with Erika Cabrera. We discussed the significance of this finding at great length. She states her understanding of this pathology report.  A copy of the pathology report was provided to Erika Cabrera.     Oncology History    2018: Left breast cT2 N1a, IDC, G1, +/+/-.        Malignant neoplasm of upper-outer quadrant of left female breast (CMS-HCC)    06/07/2017 -  Presenting Symptoms     Left breast palpable UOQ mass  06/07/2017 Interval Scan(s)     Diagnostic MMG at [facility]: left breast UOQ mass difficult to measure, but appears to measure at least 5.6 cm.    [place]: left breast ill defined shadowing lesion at 2:00, 6 CFN measuring 3.7 x 2.6 x 2.5 cm.     Single mildly abnormal node in left axilla with constriction of fatty hilum.         06/13/2017 Biopsy     [facility] core biopsy of left breast 2:00, 6 CFN: IDC, G1, ER+(95%), PR+(30%), HER2 negative by FISH.  [facility] core biopsy left axillary LN: +LN metastasis.         06/13/2017 -  Other     Seen in [facility] ED following core biopsy in [facility] for axillary pain with non-expanding 4 cm hematoma noted in left axilla. No intervention required other than symptomatic measures (compression, pain management).         06/20/2017 Initial Diagnosis     Malignant neoplasm of upper-outer quadrant of left female breast (CMS-HCC)         06/20/2017 Tumor Board     MDC recs: left breast cT2 N1a IDC, G1, receptors pending. Per outside imaging there was a single abnormal LN found by [place] only. Awaiting receptors. Need SAVI into that clipped node. Discussed that she otherwise fits ACOSOG Z0011 criteria with negative clinical exam prior to biopsy and single abnormal LN on axillary [place]. Reasonable to do SLNB and excise biopsied node as well. If receptors favorable, consider surgery first approach, good BCT candidate. If high risk (TN, HER+) may need NACT approach. Briefly discussed ALTERNATE, but consensus was that she will likely be recommended for chemo given age and nodal positivity. Meeting surgery, rad/onc today. Will circle back with med/onc as needed.         07/03/2017 Surgery     Left [facility] localized/US guided partial mastectomy and left sentinel lymph node biopsy with targeted axillary lymph node excision: Invasive mammary carcinoma with mixed ductal and lobular features present in association with in situ carcinoma with mixed ductal and lobular features measuring 8.3 cm with positive inferior and lateral margin. Sentinel lymph node biopsy: 1/1 node positive for metastatic disease measuring 7 mm. No ECE. Palpable axillary lymph nodes: 4/4 lymph nodes with metastatic disease measuring 10 mm.  No ECE.  Additional palpable axillary lymph node 0/1 with isolated tumor cells. SAVI scout in the axilla was defective and there was no localization signal from the SAVI probe    ??    ??           08/14/2017 Surgery     Left re-excision, ALND w ARM: Lateral margin: Invasive carcinoma with mixed ductal and lobular features measuring 7mm w LVI, margin negative.   Inferior margin: Multiple foci of lymphovasular invasion present.  No evidence of stromal invasion (final margin negative)  Left ALND: ??Two of twenty-three lymph nodes positive for carcinoma (2/23).  Size of larger tumor deposit: 6 mm.  Extracapsular extension: Absent         09/21/2017 -  Chemotherapy     Started ddTaxol--->ddAC         09/21/2017 Adverse Reaction     First Taxol infused for ~20 minutes resulting in flushing with chest tightness and nausea.  Infusion was stopped and administered.  Symptoms subsided within 20 minutes but she continued to feel some chest tightness with deep inspiration.  Albuterol nebulizer was given.  She then returned to baseline and infusion was  restarted at 1/2 rate and titrated up slowly.  She was able to complete infusion without any further events.             Physical Exam:  There were no vitals taken for this visit.  General Appearance: No acute distress, well appearing and well nourished.   Breast: The breasts are symmetrical. The left 2 o'clock incision and left axillary incision is clean, dry, intact and healing well.  There is no underlying hematoma or seroma in the breast.  There is a small seroma in the axilla.  35ml of clear yellow fluid was aspirated and discarded.  Right CW PAC is C/D/I and healing well. There is incomplete healing at the medial portion.  The glue has been removed.  Dermabond was therefore reapplied.        Assessment:  Ms. Mcdaris is a 62 y.o. female with a left breast infiltrating ductal carcinoma, estrogen receptor positive, progesterone receptor positive and HER2 negative, who is recovering well after left [facility] localized, US guided partial mastectomy and left sentinel lymph node biopsy with targeted axillary lymph node excision.  Cancer Staging  Malignant neoplasm of upper-outer quadrant of left female breast (CMS-HCC)  Staging form: Breast, AJCC 8th Edition  - Clinical stage from 06/20/2017: Stage IIA (cT2, cN1(f), cM0, G1, ER: Positive, PR: Positive, HER2: Negative) - Signed by Talbert Cage, DO on 06/26/2017  - Pathologic stage from 07/13/2017: Stage IB (pT3, pN2a(sn), cM0, G2, ER: Positive, PR: Positive, HER2: Negative) - Signed by Rudie Meyer, ANP on 07/19/2017      Plan:  Recurrence of seroma may have been related to force from vomiting. She will continue compression over the surgical site to help prevent recurrence of the seroma.  Reviewed s/sx of recurrence.  She will call if this occurs.   She does note some ongoing discomfort/numbness.  She takes gabapentin, up to 5 pills/day.  Discussed that she may try increasing this to see if that helps with her symptoms.  She is recommended to remain off NSAIDs d/t chemotherapy.   Follow-up in Nov with mammogram    At the conclusion of our visit all of Syann's questions and concerns were addressed and arrangements were made for follow up US discussed above. She was instructed call us with any further questions, concerns or issues.    Cancer Staging  Malignant neoplasm of upper-outer quadrant of left female breast (CMS-HCC)  Staging form: Breast, AJCC 8th Edition  - Clinical stage from 06/20/2017: Stage IIA (cT2, cN1(f), cM0, G1, ER: Positive, PR: Positive, HER2: Negative) - Signed by Talbert Cage, DO on 06/26/2017  - Pathologic stage from 07/13/2017: Stage IB (pT3, pN2a(sn), cM0, G2, ER: Positive, PR: Positive, HER2: Negative) - Signed by Rudie Meyer, ANP on 07/19/2017      Pollyann Samples, NP-C  Surgical Breast Oncology  10/02/2017  1:45 PM

## 2017-10-05 ENCOUNTER — Encounter: Admit: 2017-10-05 | Discharge: 2017-10-05 | Payer: PRIVATE HEALTH INSURANCE

## 2017-10-05 ENCOUNTER — Encounter
Admit: 2017-10-05 | Discharge: 2017-10-05 | Payer: PRIVATE HEALTH INSURANCE | Attending: Medical Oncology | Primary: Medical Oncology

## 2017-10-05 DIAGNOSIS — Z170 Estrogen receptor positive status [ER+]: Secondary | ICD-10-CM

## 2017-10-05 DIAGNOSIS — C50412 Malignant neoplasm of upper-outer quadrant of left female breast: Principal | ICD-10-CM

## 2017-10-05 LAB — ALBUMIN: Albumin:MCnc:Pt:Ser/Plas:Qn:: 4.4

## 2017-10-05 LAB — CBC W/ AUTO DIFF
BASOPHILS ABSOLUTE COUNT: 0.1 10*9/L (ref 0.0–0.1)
BASOPHILS RELATIVE PERCENT: 0.5 %
EOSINOPHILS ABSOLUTE COUNT: 0.1 10*9/L (ref 0.0–0.4)
EOSINOPHILS RELATIVE PERCENT: 1 %
HEMATOCRIT: 42.7 % (ref 36.0–46.0)
HEMOGLOBIN: 14.4 g/dL (ref 12.0–16.0)
LARGE UNSTAINED CELLS: 2 % (ref 0–4)
LYMPHOCYTES ABSOLUTE COUNT: 3.1 10*9/L (ref 1.5–5.0)
LYMPHOCYTES RELATIVE PERCENT: 29.6 %
MEAN CORPUSCULAR HEMOGLOBIN CONC: 33.7 g/dL (ref 31.0–37.0)
MEAN CORPUSCULAR HEMOGLOBIN: 28.7 pg (ref 26.0–34.0)
MEAN CORPUSCULAR VOLUME: 85.2 fL (ref 80.0–100.0)
MEAN PLATELET VOLUME: 8.5 fL (ref 7.0–10.0)
MONOCYTES ABSOLUTE COUNT: 0.4 10*9/L (ref 0.2–0.8)
NEUTROPHILS ABSOLUTE COUNT: 6.7 10*9/L (ref 2.0–7.5)
NEUTROPHILS RELATIVE PERCENT: 63.7 %
RED BLOOD CELL COUNT: 5.01 10*12/L (ref 4.00–5.20)
RED CELL DISTRIBUTION WIDTH: 14.3 % (ref 12.0–15.0)

## 2017-10-05 LAB — HEPATIC FUNCTION PANEL
ALBUMIN: 4.4 g/dL (ref 3.5–5.0)
ALT (SGPT): 47 U/L (ref 15–48)
BILIRUBIN DIRECT: 0.2 mg/dL (ref 0.00–0.40)
BILIRUBIN TOTAL: 0.6 mg/dL (ref 0.0–1.2)
PROTEIN TOTAL: 6.9 g/dL (ref 6.5–8.3)

## 2017-10-05 LAB — MONOCYTES RELATIVE PERCENT: Lab: 3.7

## 2017-10-05 NOTE — Unmapped (Addendum)
-   We sent a prescription for EMLA cream to put over the port to ease pain with accessing the port if needed.  You can put this on before you come into clinic to get your labs drawn  - We are switching to a different taxane drug called Abraxane which will not have the risk of infusion reaction  - Since we are going to an every 3 week schedule with abraxane, we will NOT give the neulasta and avoid the bony pain.  But let us know if you get a fever or shaking chills or signs of infection.  - We will se3 you back in 3 weeks on 10/26/17  for the next infusion, and 11/16/17 after that for cycle 4    [We cancelled  3/15, 3/16, 3/29,3/30]    Results for orders placed or performed in visit on 10/05/17   Hepatic Function Panel   Result Value Ref Range    Albumin 4.4 3.5 - 5.0 g/dL    Total Protein 6.9 6.5 - 8.3 g/dL    Total Bilirubin 0.6 0.0 - 1.2 mg/dL    Bilirubin, Direct 9.60 0.00 - 0.40 mg/dL    AST 35 14 - 38 U/L    ALT 47 15 - 48 U/L    Alkaline Phosphatase 85 38 - 126 U/L   CBC w/ Differential   Result Value Ref Range    WBC 10.4 4.5 - 11.0 10*9/L    RBC 5.01 4.00 - 5.20 10*12/L    HGB 14.4 12.0 - 16.0 g/dL    HCT 45.4 09.8 - 11.9 %    MCV 85.2 80.0 - 100.0 fL    MCH 28.7 26.0 - 34.0 pg    MCHC 33.7 31.0 - 37.0 g/dL    RDW 14.7 82.9 - 56.2 %    MPV 8.5 7.0 - 10.0 fL    Platelet 109 (L) 150 - 440 10*9/L    Neutrophils % 63.7 %    Lymphocytes % 29.6 %    Monocytes % 3.7 %    Eosinophils % 1.0 %    Basophils % 0.5 %    Absolute Neutrophils 6.7 2.0 - 7.5 10*9/L    Absolute Lymphocytes 3.1 1.5 - 5.0 10*9/L    Absolute Monocytes 0.4 0.2 - 0.8 10*9/L    Absolute Eosinophils 0.1 0.0 - 0.4 10*9/L    Absolute Basophils 0.1 0.0 - 0.1 10*9/L    Large Unstained Cells 2 0 - 4 %       For appointments call [phone]  For new symptoms or health related issues, please call   Nurse Navigator: [name], RN [phone]  On weekends and after hours on weekdays, please call [phone] for the oncology fellow on call.   ??  If you use myUNCchart, please know that I check messages only twice a week. If your message is urgent, please call or page your nurse navigator or the on-call fellow.

## 2017-10-05 NOTE — Unmapped (Signed)
Pt presents in NAD, no complaints, pain free, no negative changes since last visit. Port accessed prior to arrival to infusion center.+blood return. Due to this being  C1D1 infusion. Pharm D paged to give patient teaching and medication review.    1345 Port de-accessed after 500 units of heparin instilled pt tolerated infusion without difficulty. NAD, no questions, no complaints at time of discharge, verbalized understanding of d/c instructions. Pt left infusion center ambulatory, steady gait.

## 2017-10-05 NOTE — Unmapped (Signed)
Breast Cancer New Patient Evaluation:     Referring Physician: Cala Bradford, Md  [address]  [facility]  [place], [place] [address]. [phone]    PCP: Stacie Acres WHITE, MD    Assessment/Plan:  I spent at least 40 minutes with this patient more than 50% in counseling. The following issues were discussed:  A 61yo woman with a Left breast pT3pN2a, IDC, G2, ER+(95%)/PR+(30%)/HER2-neg, 5/6 LN positive, +LVI and positive surgical margins s/p BCT with targeted axillary LN bx on 07/03/17. Unfortunately her pathology revealed R1 resection and she requires re-excision with ALND. Completed this on 08/14/17 showing 7 mm of residual tumor, negative margins, 2/23 +LN with larger tumor deposit of 6 mm. Planning for ddAC-T but she has pre-exsisting neuropathy which makes use of weekly paclitaxel challenging so tried q 2 week taxol. However, she had a hypesensitivity reaction  into Taxol infusion and was able to complete at a slower rate.  Accordingly, switched to q3w Abraxane. S he is postmenopausal so will plan for an aromatase inhibitor for her HR+ diease.     - Completed re-excision/ALND 08/14/17, now cleared by surgery   - Plan: ddTaxol x 4, followed by ddAC x4C -> Abraxance q3w C2-C4 d/t infusion rxn at C1, f/b ddAC x4C   - Insurance declined OBI Neulasta, approved for Udenyca with dd-paclitaxel.  - Labs and clinically appropriate for C2 with abraxane today. No filgrastim given q3w regimen  - Antiestrogen therapy (an AI) for 5-91yrs  - CT-CAP 07/25/17 NED  - Echo 07/27/17 EF >55%  - Port placed 09/28/17    RTC in 3 weeks for C3 taxane    Pt. seen and discussed with Dr. Vladimir Creeks MD  Hematology/Oncology Fellow  =      Reason for Visit: A 62 y.o. female with breast cancer referred for consultation by Cala Bradford, Md  [address]  [facility]  [place], [place] [address] for recommendations concerning the management of breast cancer.    She initially presented with self-palpated left breast mass and workup/surgery revealed an 8.3cm tumor HR+/HER2-neg with positive LNs. She is recovering well from surgery.  Drains removed a week ago.She has some pain on the left side of her breast and pain plus numbness down inner side of her left arm. She reports that she has baseline neuropathy in her feet and is followed by a Neuroloogist in [place].  She does not have diabetes and states that the neuropathy is due to structural issues with her feet and high arches and runs in her family. She has no history of heart issues, never had an MI or heart failure. She recently moved to [facility] from [place].  She was laid off from her job and was working part-time but could not qualify for [employer] on the open market so she had to quit her part-time job.  States she would appreciate seeing a Artist to figure out how to pay for all this.    Interval History:  Here unaccompanied. Infusion reaction with C1 paclitaxel (chest tightness,dyspnea,  n/v, urinary incontinence).  Responded to medications and tolerated remainder of infusion at a slower rate.  She had increased neuropathy for 2-3 days after paclitaxel infusion, that then abated to her baseline neuropathy - has chronic persistent  neuropathy 2/2 chronic hammer toes and high arches. Cannot walk barefoot-neuropathy is located primarily on the balls of both feet.  Taking gabapentin 900mg  qhs.  She also experienced intense bony pain after filgrastim, took claritin and tylenol with modest relief. Seroma almost resolved, no longer draining.    No f/c/n/v/d.    Performance status=0    Breast Cancer History:   Oncology History    2018: Left breast cT2 N1a, IDC, G1, +/+/-.        Malignant neoplasm of upper-outer quadrant of left female breast (CMS-HCC)    06/07/2017 -  Presenting Symptoms     Left breast palpable UOQ mass         06/07/2017 Interval Scan(s)     Diagnostic [facility]G at [facility]: left breast UOQ mass difficult to measure, but appears to measure at least 5.6 cm.    [place]: left breast ill defined shadowing lesion at 2:00, 6 CFN measuring 3.7 x 2.6 x 2.5 cm.     Single mildly abnormal node in left axilla with constriction of fatty hilum.         06/13/2017 Biopsy     [facility] core biopsy of left breast 2:00, 6 CFN: IDC, G1, ER+(95%), PR+(30%), HER2 negative by FISH.  [facility] core biopsy left axillary LN: +LN metastasis.         06/13/2017 -  Other     Seen in [facility] ED following core biopsy in [facility] for axillary pain with non-expanding 4 cm hematoma noted in left axilla. No intervention required other than symptomatic measures (compression, pain management).         06/20/2017 Tumor Board     [facility] recs: left breast cT2 N1a IDC, G1, receptors pending. Per outside imaging there was a single abnormal LN found by [place] only. Awaiting receptors. Need SAVI into that clipped node. Discussed that she otherwise fits ACOSOG Z0011 criteria with negative clinical exam prior to biopsy and single abnormal LN on axillary [place]. Reasonable to do SLNB and excise biopsied node as well. If receptors favorable, consider surgery first approach, good BCT candidate. If high risk (TN, HER+) may need NACT approach. Briefly discussed ALTERNATE, but consensus was that she will likely be recommended for chemo given age and nodal positivity. Meeting surgery, rad/onc today. Will circle back with med/onc as needed.         07/03/2017 Surgery     Left [facility] localized/US guided partial mastectomy and left sentinel lymph node biopsy with targeted axillary lymph node excision: Invasive mammary carcinoma with mixed ductal and lobular features present in association with in situ carcinoma with mixed ductal and lobular features measuring 8.3 cm with positive inferior and lateral margin. Sentinel lymph node biopsy: 1/1 node positive for metastatic disease measuring 7 mm. No ECE. Palpable axillary lymph nodes: 4/4 lymph nodes with metastatic disease measuring 10 mm.  No ECE.  Additional palpable axillary lymph node 0/1 with isolated tumor cells. SAVI scout in the axilla was defective and there was no localization signal from the SAVI probe    ??    ??           08/14/2017 Surgery     Left re-excision, ALND w ARM: Lateral margin: Invasive carcinoma with mixed ductal and lobular features measuring 7mm w LVI, margin negative.   Inferior margin: Multiple foci of lymphovasular invasion present.  No evidence of stromal invasion (final margin negative)  Left ALND: ??Two of twenty-three lymph nodes positive for carcinoma (2/23).  Size of larger tumor deposit: 6 mm.  Extracapsular extension: Absent         09/21/2017 -  Chemotherapy  Started ddTaxol--->ddAC         09/21/2017 Adverse Reaction     First Taxol infused for ~20 minutes resulting in flushing with chest tightness and nausea.  Infusion was stopped and administered.  Symptoms subsided within 20 minutes but she continued to feel some chest tightness with deep inspiration.  Albuterol nebulizer was given.  She then returned to baseline and infusion was restarted at 1/2 rate and titrated up slowly.  She was able to complete infusion without any further events.                 Past Medical History:  Patient Active Problem List   Diagnosis   ??? Malignant neoplasm of upper-outer quadrant of left female breast (CMS-HCC)       Gyn History:   LMP 2012    Surgical History:   Past Surgical History:   Procedure Laterality Date   ??? ANAL FISSURECTOMY     ??? BREAST BIOPSY Left 06/2017    malignant   ??? BREAST BIOPSY Left 06/2017    Lympnode bx-positive   ??? IR INSERT PORT AGE GREATER THAN 5 YRS  09/28/2017    IR INSERT PORT AGE GREATER THAN 5 YRS 09/28/2017 Rush Barer, MD IMG VIR [facility]   ??? PR BX/REMV,LYMPH NODE,DEEP AXILL Left 07/03/2017    Procedure: BX/EXC LYMPH NODE; OPEN, DEEP AXILRY NODE;  Surgeon: Talbert Cage, DO;  Location: ASC OR [facility];  Service: Surgical Oncology   ??? PR INTRAOPERATIVE SENTINEL LYMPH NODE ID W DYE INJECTION Left 07/03/2017    Procedure: INTRAOPERATIVE IDENTIFICATION SENTINEL LYMPH NODE(S) INCLUDE INJECTION NON-RADIOACTIVE DYE, WHEN PERFORMED;  Surgeon: Talbert Cage, DO;  Location: ASC OR [facility];  Service: Surgical Oncology   ??? PR INTRAOPERATIVE SENTINEL LYMPH NODE ID W DYE INJECTION Left 08/14/2017    Procedure: INTRAOPERATIVE IDENTIFICATION SENTINEL LYMPH NODE(S) INCLUDE INJECTION NON-RADIOACTIVE DYE, WHEN PERFORMED;  Surgeon: Talbert Cage, DO;  Location: ASC OR [facility];  Service: Surgical Oncology Breast   ??? PR MASTECTOMY, PARTIAL Left 07/03/2017    Procedure: MASTECTOMY, savi scout PARTIAL (EG, LUMPECTOMY, TYLECTOMY, QUADRANTECTOMY, SEGMENTECTOMY);  Surgeon: Talbert Cage, DO;  Location: ASC OR [facility];  Service: Surgical Oncology   ??? PR MASTECTOMY, PARTIAL Left 08/14/2017    Procedure: MASTECTOMY, PARTIAL (EG, LUMPECTOMY, TYLECTOMY, QUADRANTECTOMY, SEGMENTECTOMY) Re-excision;  Surgeon: Talbert Cage, DO;  Location: ASC OR [facility];  Service: Surgical Oncology Breast   ??? PR REMOVE ARMPITS LYMPH NODES COMPLT Left 08/14/2017    Procedure: AXILLARY LYMPHADENECTOMY; COMPLETE;  Surgeon: Talbert Cage, DO;  Location: ASC OR [facility];  Service: Surgical Oncology Breast       Medications:  Current Outpatient Prescriptions   Medication Sig Dispense Refill   ??? acetaminophen (TYLENOL) 500 MG tablet Take 1,000 mg by mouth every four (4) hours as needed for pain.     ??? buPROPion (WELLBUTRIN XL) 300 MG 24 hr tablet Take 300 mg by mouth daily.     ??? docusate sodium (COLACE) 100 MG capsule Take 100 mg by mouth daily with breakfast.     ??? FLUoxetine (PROZAC) 10 MG tablet Take 10 mg by mouth daily.     ??? GABAPENTIN ORAL Take 900 mg by mouth nightly. Pt taking 1 capsule at noon and 3 capsules at 8PM     ??? loratadine (CLARITIN ORAL) Take by mouth.     ??? ondansetron (ZOFRAN) 8 MG tablet Take 1 tablet (8 mg total) by mouth every twelve (12) hours as needed for nausea.  30 tablet 2   ??? ranitidine (ZANTAC) 150 MG tablet Take 150 mg by mouth daily.      ??? multivit with iron,hematinic (SUPER B-COMPLEX ORAL) Take 1 capsule by mouth daily with breakfast.       No current facility-administered medications for this visit.        Allergies:  Allergies   Allergen Reactions   ??? Erythromycin Diarrhea   ??? Penicillins Rash       Personal and Social History:  Social History     Social History   ??? Marital status: Married     Spouse name: N/A   ??? Number of children: N/A   ??? Years of education: N/A     Occupational History   ??? Not on file.     Social History Main Topics   ??? Smoking status: Never Smoker   ??? Smokeless tobacco: Never Used   ??? Alcohol use No   ??? Drug use: No   ??? Sexual activity: Not on file     Other Topics Concern   ??? Not on file     Social History Narrative    The patient is married. She lives at home with her husband, Ed.  She has two children       Family History:  Cancer-related family history includes Breast cancer in her paternal aunt. There is no history of Cancer or Ovarian cancer.  indicated that the status of her mother is unknown. She indicated that the status of her father is unknown. She indicated that the status of her sister is unknown. She indicated that the status of her maternal grandmother is unknown. She indicated that the status of her maternal grandfather is unknown. She indicated that the status of her paternal grandmother is unknown. She indicated that the status of her paternal grandfather is unknown. She indicated that the status of her daughter is unknown. She indicated that the status of her neg hx is unknown.       Review of Systems: A complete review of systems was obtained including: Constitutional, Eyes, ENT, Cardiovascular, Respiratory, GI, GU, Musculoskeletal, Skin, Neurological, Psychiatric, Endocrine, Heme/Lymphatic, and Allergic/Immunologic systems. It is negative or non-contributory to the patient???s management except for the following: None    Physical Examination:  Vital Signs: BP 122/73  - Pulse 94  - Temp 36.8 ??C (98.2 ??F) (Oral)  - Resp 16  - Ht 171 cm (5' 7.32)  - Wt 96.1 kg (211 lb 14.4 oz)  - SpO2 94%  - BMI 32.87 kg/m??   General:  Healthy-appearing female in no acute distress..  Cardiovascular:  Heart not clinically enlarged.  No significant murmurs or gallops.  No lower extremity edema.    Respiratory:  Chest clear to percussion and auscultation.   Gastrointestinal:  Abdomen soft without masses and tenderness, no hepatosplenomegaly or other masses.   Musculoskeletal:  No bony pain or tenderness.   Skin and Subcutaneous Tissues:  Negative. No rash, ecchymoses, purpuric lesions noted.  Breasts:  Left breast with well healing scar.  Mild swelling lateral left breast. Resolving erythema, no fluctuance. Left axilla tender and healing scar. R breast normal appearing.  Psychiatric:  Alert and oriented.  Mood is normal.  No other symptoms.   Heme/Lymphatic/Immunologic:  No cervical or axillary adenopathy.   Upper Extremity Lymphedema: None.

## 2017-10-05 NOTE — Unmapped (Signed)
Results for BELLATRIX, DEVONSHIRE (MRN [id number]) as of 10/05/2017 14:00   Ref. Range 10/05/2017 08:58   WBC Latest Ref Range: 4.5 - 11.0 10*9/L 10.4   RBC Latest Ref Range: 4.00 - 5.20 10*12/L 5.01   HGB Latest Ref Range: 12.0 - 16.0 g/dL 69.6   HCT Latest Ref Range: 36.0 - 46.0 % 42.7   MCV Latest Ref Range: 80.0 - 100.0 fL 85.2   MCH Latest Ref Range: 26.0 - 34.0 pg 28.7   MCHC Latest Ref Range: 31.0 - 37.0 g/dL 29.5   RDW Latest Ref Range: 12.0 - 15.0 % 14.3   MPV Latest Ref Range: 7.0 - 10.0 fL 8.5   Platelet Latest Ref Range: 150 - 440 10*9/L 109 (L)   Neutrophils % Latest Units: % 63.7   Lymphocytes % Latest Units: % 29.6   Monocytes % Latest Units: % 3.7   Eosinophils % Latest Units: % 1.0   Basophils % Latest Units: % 0.5   Absolute Neutrophils Latest Ref Range: 2.0 - 7.5 10*9/L 6.7   Absolute Lymphocytes Latest Ref Range: 1.5 - 5.0 10*9/L 3.1   Absolute Monocytes  Latest Ref Range: 0.2 - 0.8 10*9/L 0.4   Absolute Eosinophils Latest Ref Range: 0.0 - 0.4 10*9/L 0.1   Absolute Basophils  Latest Ref Range: 0.0 - 0.1 10*9/L 0.1   Large Unstained Cells Latest Ref Range: 0 - 4 % 2   Albumin Latest Ref Range: 3.5 - 5.0 g/dL 4.4   Total Protein Latest Ref Range: 6.5 - 8.3 g/dL 6.9   Total Bilirubin Latest Ref Range: 0.0 - 1.2 mg/dL 0.6   Bilirubin, Direct Latest Ref Range: 0.00 - 0.40 mg/dL 2.84   AST Latest Ref Range: 14 - 38 U/L 35   ALT Latest Ref Range: 15 - 48 U/L 47   Alkaline Phosphatase Latest Ref Range: 38 - 126 U/L 85       If you feel like you're having an emergency please call 911.  For appointments or questions Monday through Friday 8AM-5PM please call [phone] or Toll Free [phone]. For Medical questions or concerns ask for the Nurse Triage Line.  On Nights, Weekends, and Holidays call [phone] and ask for the Oncologist on Call.  Reasons to call the Nurse Triage Line:  Fever of 100.5 or greater  Nausea and/or vomiting not relived with nausea medicine  Diarrhea or constipation  Severe pain not relieved with usual pain regimen  Shortness of breath  Uncontrolled bleeding  Mental status changes

## 2017-10-05 NOTE — Unmapped (Signed)
Labs drawn and sent for analysis.  Care provided by  Harvel Quale, RN

## 2017-10-09 NOTE — Unmapped (Signed)
Encounter addended by: Anne Shutter, CPP on: 10/09/2017 11:23 AM<BR>    Actions taken: Sign clinical note, Visit Navigator Flowsheet section accepted

## 2017-10-09 NOTE — Unmapped (Signed)
Pharmacy: First Cycle Chemotherapy Patient Education    Chemotherapy regimen/agents: Abraxane  Caregivers present for education: none    Erika Cabrera is a 62 year old with breast cancer. Chemotherapy education was provided to the patient by an oncology pharmacist.  She is changing from conventional paclitaxel d/t an infusion reaction with Cycle 1.    Side effects discussed included but were not limited to:   infusion-related reactions, complications associated with myelosuppresion (such as infection/fever, fatigue, and bleeding), nausea/vomiting, diarrhea, constipation, mucositis, taste changes, hair loss, skin/nail changes, peripheral neuropathy, myalgia or fatigue.    The [facility] patient handout or the Hematology/Oncology Fellow on-call phone number for concerns after hours were given.The patient verbalized understanding of this information.  Medication reconciliation was completed and the medication list was updated in EPIC.      Approximate time spent with patient: 10  Minutes.    Kennon Holter, PharmD, CPP

## 2017-10-11 ENCOUNTER — Encounter
Admit: 2017-10-11 | Discharge: 2017-10-12 | Payer: PRIVATE HEALTH INSURANCE | Attending: Adult Health | Primary: Adult Health

## 2017-10-11 ENCOUNTER — Encounter: Admit: 2017-10-11 | Discharge: 2017-10-12 | Payer: PRIVATE HEALTH INSURANCE

## 2017-10-11 DIAGNOSIS — Z170 Estrogen receptor positive status [ER+]: Secondary | ICD-10-CM

## 2017-10-11 DIAGNOSIS — C50412 Malignant neoplasm of upper-outer quadrant of left female breast: Principal | ICD-10-CM

## 2017-10-11 LAB — CBC W/ AUTO DIFF
BASOPHILS ABSOLUTE COUNT: 0 10*9/L (ref 0.0–0.1)
BASOPHILS RELATIVE PERCENT: 1 %
EOSINOPHILS ABSOLUTE COUNT: 0 10*9/L (ref 0.0–0.4)
EOSINOPHILS RELATIVE PERCENT: 1.3 %
HEMATOCRIT: 34.7 % — ABNORMAL LOW (ref 36.0–46.0)
HEMOGLOBIN: 12 g/dL (ref 12.0–16.0)
LARGE UNSTAINED CELLS: 4 % (ref 0–4)
LYMPHOCYTES ABSOLUTE COUNT: 1.5 10*9/L (ref 1.5–5.0)
LYMPHOCYTES RELATIVE PERCENT: 58.2 %
MEAN CORPUSCULAR HEMOGLOBIN CONC: 34.7 g/dL (ref 31.0–37.0)
MEAN CORPUSCULAR HEMOGLOBIN: 28.9 pg (ref 26.0–34.0)
MEAN PLATELET VOLUME: 8.8 fL (ref 7.0–10.0)
MONOCYTES ABSOLUTE COUNT: 0 10*9/L — ABNORMAL LOW (ref 0.2–0.8)
NEUTROPHILS ABSOLUTE COUNT: 0.9 10*9/L — ABNORMAL LOW (ref 2.0–7.5)
PLATELET COUNT: 267 10*9/L (ref 150–440)
RED BLOOD CELL COUNT: 4.17 10*12/L (ref 4.00–5.20)
WBC ADJUSTED: 2.6 10*9/L — ABNORMAL LOW (ref 4.5–11.0)

## 2017-10-11 LAB — MEAN PLATELET VOLUME: Lab: 8.8

## 2017-10-11 LAB — SMEAR REVIEW

## 2017-10-11 MED ORDER — APIXABAN 5 MG (74 TABS) TABLETS IN A DOSE PACK
ORAL_TABLET | 0 refills | 0.00000 days | Status: CP
Start: 2017-10-11 — End: 2017-11-16

## 2017-10-11 MED ORDER — DOXYCYCLINE MONOHYDRATE 100 MG CAPSULE
ORAL_CAPSULE | Freq: Two times a day (BID) | ORAL | 0 refills | 0.00000 days | Status: CP
Start: 2017-10-11 — End: 2017-10-11

## 2017-10-11 NOTE — Unmapped (Signed)
Infusion Progress Note    Patient Name: Erika Cabrera  Patient Age: 62 y.o.  Encounter Date: 10/11/2017  Allergies   Allergen Reactions   ??? Erythromycin Diarrhea   ??? Penicillins Rash     Reason for visit  Infusion Center visit   Today is acute visit  Chief Complaint: right neck edema and tenderness     Assessment/Plan:  Right neck tenderness and edema- most concerning differentials: cellulitis, DVT,  -cbc  -PVL r/o DVT  -doxycycline escripted to preferred pharmacy (canceled in light of PVL results)  DVT- right internal jugular vein, brachiocephalic vein, and subclavian vein  -eliquis starter pack escripted to preferred pharmacy  -will likely need to continue 3-6 months of treatment  Breast cancer-  -under care of Dr Avis Epley and Salina April NP  -last dose c1d1 Abraxane 10/05/17    Interval History/HPI:   Erika Cabrera is a 62 yo female with left breast cancer. Today she presents to infusion for evaluation of her neck which she feels is a swollen lymph node. Erika Cabrera had c1d1 Abraxane on 10/05/17.  Health history obtained from patient and chart. Erika Cabrera states that yesterday in she noticed her right neck seemed tender and swollen. Today she states her symptoms were more pronounced. She denies any URI focal symptoms. Denies sick contacts. Denies fever or chills with rigors but states she was cold in the house yesterday and wore a hoodie. Denies trauma to area, N/V/D, SOB, or CP. Endorses new PN in form of tingling finger tips and tingling lips. Denies swelling tongue, change in swallowing, change in voice, or any other difficulties. No change in ROM of neck or right shoulder. No further edema in right arm or shoulder.     Oncology History    2018: Left breast cT2 N1a, IDC, G1, +/+/-.        Malignant neoplasm of upper-outer quadrant of left female breast (CMS-HCC)    06/07/2017 -  Presenting Symptoms     Left breast palpable UOQ mass         06/07/2017 Interval Scan(s)     Diagnostic MMG at [facility]: left breast UOQ mass difficult to measure, but appears to measure at least 5.6 cm.    [place]: left breast ill defined shadowing lesion at 2:00, 6 CFN measuring 3.7 x 2.6 x 2.5 cm.     Single mildly abnormal node in left axilla with constriction of fatty hilum.         06/13/2017 Biopsy     [facility] core biopsy of left breast 2:00, 6 CFN: IDC, G1, ER+(95%), PR+(30%), HER2 negative by FISH.  [facility] core biopsy left axillary LN: +LN metastasis.         06/13/2017 -  Other     Seen in [facility] Erika Cabrera following core biopsy in [facility] for axillary pain with non-expanding 4 cm hematoma noted in left axilla. No intervention required other than symptomatic measures (compression, pain management).         06/20/2017 Tumor Board     [facility] recs: left breast cT2 N1a IDC, G1, receptors pending. Per outside imaging there was a single abnormal LN found by [place] only. Awaiting receptors. Need SAVI into that clipped node. Discussed that she otherwise fits ACOSOG Z0011 criteria with negative clinical exam prior to biopsy and single abnormal LN on axillary [place]. Reasonable to do SLNB and excise biopsied node as well. If receptors favorable, consider surgery first approach, good BCT candidate. If high risk (TN, HER+) may  need NACT approach. Briefly discussed ALTERNATE, but consensus was that she will likely be recommended for chemo given age and nodal positivity. Meeting surgery, rad/onc today. Will circle back with med/onc as needed.         07/03/2017 Surgery     Left [facility] localized/US guided partial mastectomy and left sentinel lymph node biopsy with targeted axillary lymph node excision: Invasive mammary carcinoma with mixed ductal and lobular features present in association with in situ carcinoma with mixed ductal and lobular features measuring 8.3 cm with positive inferior and lateral margin. Sentinel lymph node biopsy: 1/1 node positive for metastatic disease measuring 7 mm. No ECE. Palpable axillary lymph nodes: 4/4 lymph nodes with metastatic disease measuring 10 mm.  No ECE.  Additional palpable axillary lymph node 0/1 with isolated tumor cells. SAVI scout in the axilla was defective and there was no localization signal from the SAVI probe    ??    ??           08/14/2017 Surgery     Left re-excision, ALND w ARM: Lateral margin: Invasive carcinoma with mixed ductal and lobular features measuring 7mm w LVI, margin negative.   Inferior margin: Multiple foci of lymphovasular invasion present.  No evidence of stromal invasion (final margin negative)  Left ALND: ??Two of twenty-three lymph nodes positive for carcinoma (2/23).  Size of larger tumor deposit: 6 mm.  Extracapsular extension: Absent         09/21/2017 -  Chemotherapy     Started ddTaxol--->ddAC         09/21/2017 Adverse Reaction     First Taxol infused for ~20 minutes resulting in flushing with chest tightness and nausea.  Infusion was stopped and administered.  Symptoms subsided within 20 minutes but she continued to feel some chest tightness with deep inspiration.  Albuterol nebulizer was given.  She then returned to baseline and infusion was restarted at 1/2 rate and titrated up slowly.  She was able to complete infusion without any further events.             Past Medical History:   Past Medical History:   Diagnosis Date   ??? Breast cancer (CMS-HCC) 06/2017    Left-Per bx results in [facility]   ??? Depression    ??? GERD (gastroesophageal reflux disease)        Personal and Social History:  Social History     Social History   ??? Marital status: Married     Spouse name: N/A   ??? Number of children: N/A   ??? Years of education: N/A     Social History Main Topics   ??? Smoking status: Never Smoker   ??? Smokeless tobacco: Never Used   ??? Alcohol use No   ??? Drug use: No   ??? Sexual activity: Not on file     Other Topics Concern   ??? Not on file     Social History Narrative    The patient is married. She lives at home with her husband, Erika Cabrera.  She has two children     Review of Systems:  A complete review of systems was obtained including: Constitutional, Eyes, ENT, Cardiovascular, Respiratory, GI, GU, Musculoskeletal, Skin, Neurological, Psychiatric, Endocrine, Heme/Lymphatic, and Allergic/Immunologic systems. It is negative or non-contributory to the patient???s management except for positives mentioned in HPI.     Physical Exam:  Vital Signs:  Wt Readings from Last 3 Encounters:   10/11/17 95.9 kg (211  lb 6.7 oz)   10/05/17 96.1 kg (211 lb 12 oz)   10/05/17 96.1 kg (211 lb 14.4 oz)     Temp Readings from Last 3 Encounters:   10/11/17 37.4 ??C (99.3 ??F) (Oral)   10/05/17 37.6 ??C (99.7 ??F) (Oral)   10/05/17 36.8 ??C (98.2 ??F) (Oral)     BP Readings from Last 3 Encounters:   10/11/17 144/66   10/05/17 124/74   10/05/17 122/73     Pulse Readings from Last 3 Encounters:   10/11/17 92   10/05/17 90   10/05/17 94     General:  Calm, cooperative,  in no acute distress.  HEENT: Normocephalic and atraumatic. No scleral icterus or conjunctival injection. Oral mucosa moist   Neck: right neck edema noticeable, 1-2+, nonpitting,   Cardiovascular:  Regular rate, & rhythm. S1/S2   Respiratory: Breathing is unlabored and patient is speaking full sentences with ease.  Auscultation of lung fields reveals normal air movement without wheezing, ronchi or crackles.   Musculoskeletal: Range of motion about the shoulder, elbow, hips and knees is grossly normal.   Extremities:No clubbing, or cyanosis.  Skin and Subcutaneous Tissues:  Appropriately warm and dry  Psychiatric:  Alert and oriented   Heme/Lymphatic/Immunologic:  No lymphadenopathy in the anterior/posterior or cervical, basins.  Neuro: Stable gait and coordination.   CVAD: right chest wall port- nontender, no erythema or exudate      Results:  Recent Labs      10/11/17   1242   WBC  2.6*   HGB  12.0   HCT  34.7*   PLT  267   NEUTROABS  0.9*   LYMPHSABS  1.5   MONOSABS  0.0*   EOSABS  0.0   BASOSABS  0.0   LUC  4

## 2017-10-11 NOTE — Unmapped (Signed)
Attempted to contact the patient x2 without success to inform of infusion NP appointment today for further assessment of her symptoms voiced over MyChart. Sent a MyChart message to inform of appointment.

## 2017-10-11 NOTE — Unmapped (Addendum)
You have been prescribed eliquis for your deep vein thrombus. The first 7 days of treatment, take 10mg  (2 tablets) by mouth twice a day after first week take 5mg  (1 tablet) twice a day.       Please let us know if you have worsening symptoms of right neck swelling, port tenderness, redness, fevers, chills with rigors, or discharge.     If you feel like this is an emergency please call 911.    For appointments or questions Monday through Friday 8AM-5PM please call [phone] or Toll Free [phone]. For Medical questions or concerns ask for the Nurse Triage Line.    On Nights, Weekends, and Holidays call [phone] and ask for the Oncologist on Call.    Reasons to call the Nurse Triage Line:  Fever of 100.5 or greater  Nausea and/or vomiting not relived with nausea medicine  Diarrhea or constipation  Severe pain not relieved with usual pain regimen  Shortness of breath  Uncontrolled bleeding  Mental status changes    Results for ANDREAH, GOHEEN (MRN [id number]) as of 10/11/2017 13:00   Ref. Range 10/11/2017 12:42   WBC Latest Ref Range: 4.5 - 11.0 10*9/L 2.6 (L)   RBC Latest Ref Range: 4.00 - 5.20 10*12/L 4.17   HGB Latest Ref Range: 12.0 - 16.0 g/dL 41.3   HCT Latest Ref Range: 36.0 - 46.0 % 34.7 (L)   MCV Latest Ref Range: 80.0 - 100.0 fL 83.1   MCH Latest Ref Range: 26.0 - 34.0 pg 28.9   MCHC Latest Ref Range: 31.0 - 37.0 g/dL 24.4   RDW Latest Ref Range: 12.0 - 15.0 % 14.0   MPV Latest Ref Range: 7.0 - 10.0 fL 8.8   Platelet Latest Ref Range: 150 - 440 10*9/L 267   Neutrophils % Latest Units: % 34.2   Lymphocytes % Latest Units: % 58.2   Monocytes % Latest Units: % 1.6   Eosinophils % Latest Units: % 1.3   Basophils % Latest Units: % 1.0   Absolute Neutrophils Latest Ref Range: 2.0 - 7.5 10*9/L 0.9 (L)   Absolute Lymphocytes Latest Ref Range: 1.5 - 5.0 10*9/L 1.5   Absolute Monocytes  Latest Ref Range: 0.2 - 0.8 10*9/L 0.0 (L)   Absolute Eosinophils Latest Ref Range: 0.0 - 0.4 10*9/L 0.0   Absolute Basophils  Latest Ref Range: 0.0 - 0.1 10*9/L 0.0   Large Unstained Cells Latest Ref Range: 0 - 4 % 4   Neutrophil Left Shift Latest Ref Range: Not Present  3+ (A)            apixaban  Pronunciation:  a PIX a ban  Brand:  Eliquis  What is the most important information I should know about apixaban?  You should not take apixaban if you have an artificial heart valve, or if you have any active bleeding from a surgery, injury, or other cause.  Apixaban can cause a very serious blood clot around your spinal cord if you undergo a spinal tap or receive spinal anesthesia (epidural), especially if you have a genetic spinal defect, if you have a spinal catheter in place, if you have a history of spinal surgery or repeated spinal taps, or if you are also using other drugs that can affect blood clotting. This type of blood clot can lead to long-term or permanent paralysis.  Get emergency medical help if you have symptoms of a spinal cord blood clot such as back pain, numbness or muscle weakness in your lower  body, or loss of bladder or bowel control.  Do not stop taking apixaban unless your doctor tells you to. Stopping suddenly can increase your risk of blood clot or stroke.  What is apixaban?  Apixaban blocks the activity of certain clotting substances in the blood.  Apixaban is used to lower the risk of stroke caused by a blood clot in people with a heart rhythm disorder called atrial fibrillation.  Apixaban is also used after hip or knee replacement surgery to prevent a type of blood clot called deep vein thrombosis (DVT), which can lead to blood clots in the lungs (pulmonary embolism).  Apixaban is also used to treat DVT or pulmonary embolism (PE), and to lower your risk of having a repeat DVT or PE.  Apixaban may also be used for purposes not listed in this medication guide.  What should I discuss with my healthcare provider before taking apixaban?  You should not take apixaban if you are allergic to it, or if you have: ?? an artificial heart valve; or  ?? active bleeding from a surgery, injury, or other cause.  Apixaban may cause you to bleed more easily, especially if you have a bleeding disorder that is inherited or caused by disease.  Tell your doctor if you have ever had:  ?? kidney disease (or if you are on dialysis);  ?? liver disease;  ?? if you are older than 80; or  ?? if you weigh less than 132 pounds.  Apixaban can cause a very serious blood clot around your spinal cord if you undergo a spinal tap or receive spinal anesthesia (epidural). This type of blood clot could cause long-term paralysis, and may be more likely to occur if:   ?? you have a spinal catheter in place or if a catheter has been recently removed;  ?? you have a history of spinal surgery or repeated spinal taps;  ?? you have recently had a spinal tap or epidural anesthesia;  ?? you are taking an NSAID (nonsteroidal anti-inflammatory drug)--ibuprofen (Advil, Motrin), naproxen (Aleve), diclofenac, indomethacin, meloxicam, and others; or  ?? you are using other medicines to treat or prevent blood clots.  Taking apixaban during pregnancy may increase the risk of bleeding while you are pregnant or during your delivery. Tell your doctor if you are pregnant or plan to become pregnant.  You should not breast-feed while using this medicine.  How should I take apixaban?  Follow all directions on your prescription label and read all medication guides or instruction sheets. Your doctor may occasionally change your dose. Use the medicine exactly as directed.  You may take apixaban with or without food.  If you cannot swallow a tablet whole, crush and mix it with water, apple juice, or a spoonful of applesauce. Swallow the mixture right away without chewing. Do not save it for later use.  A crushed tablet mixture may also be given through a nasogastric (NG) feeding tube. Read and carefully follow any Instructions for Use provided with your medicine. Ask your doctor or pharmacist if you do not understand these instructions.  Apixaban can make it easier for you to bleed, even from a minor injury.  Seek medical attention if you have bleeding that will not stop.  If you need surgery or dental work, tell the doctor or dentist ahead of time if you have taken apixaban within the past 24 hours. You may need to stop taking apixaban for a short time.  Do not stop taking apixaban unless your  doctor tells you to. Stopping suddenly can increase your risk of blood clot or stroke.  If you stop taking apixaban for any reason, your doctor may prescribe another medication to prevent blood clots until you start taking apixaban again.  Store at room temperature away from moisture and heat.  What happens if I miss a dose?  Take the missed dose on the same day you remember it. Take your next dose at the regular time and stay on your twice-daily schedule. Do not take two doses at one time.  Get your prescription refilled before you run out of medicine completely.  What happens if I overdose?  Seek emergency medical attention or call the Poison Help line at [phone].  What should I avoid while taking apixaban?  Avoid activities that may increase your risk of bleeding or injury. Use extra care to prevent bleeding while shaving or brushing your teeth.  What are the possible side effects of apixaban?  Get emergency medical help if you have signs of an allergic reaction: hives; difficult breathing; swelling of your face, lips, tongue, or throat.  Also seek emergency medical attention if you have symptoms of a spinal blood clot: back pain, numbness or muscle weakness in your lower body, or loss of bladder or bowel control.  Call your doctor at once if you have:  ?? easy bruising, unusual bleeding (nose, mouth, vagina, or rectum), bleeding from wounds or needle injections, any bleeding that will not stop;  ?? heavy menstrual periods;  ?? headache, dizziness, weakness, feeling like you might pass out;  ?? urine that looks red, pink, or brown; or  ?? black or bloody stools, coughing up blood or vomit that looks like coffee grounds.  This is not a complete list of side effects and others may occur. Call your doctor for medical advice about side effects. You may report side effects to FDA at 1-800-FDA-1088.  What other drugs will affect apixaban?  Sometimes it is not safe to use certain medications at the same time. Some drugs can affect your blood levels of other drugs you take, which may increase side effects or make the medications less effective.  Many other drugs (including some over-the-counter medicines) can increase your risk of bleeding or blood clots, or your risk of developing blood clots around the brain or spinal cord during a spinal tap or epidural. It is very important to tell your doctor about all medicines you have recently used, especially:  ?? any other medicines to treat or prevent blood clots;  ?? a blood thinner such as heparin or warfarin (Coumadin, Jantoven);  ?? an antidepressant; or  ?? an NSAID (nonsteroidal anti-inflammatory drug) used long term.  This list is not complete and many other drugs may affect apixaban. This includes prescription and over-the-counter medicines, vitamins, and herbal products. Not all possible drug interactions are listed here.  Where can I get more information?  Your pharmacist can provide more information about apixaban.    Remember, keep this and all other medicines out of the reach of children, never share your medicines with others, and use this medication only for the indication prescribed.  Every effort has been made to ensure that the information provided by [employer] ('[employer]') is accurate, up-to-date, and complete, but no guarantee is made to that effect. Drug information contained herein may be time sensitive. [employer] information has been compiled for use by healthcare practitioners and consumers in the [place] and therefore [employer] does not warrant that uses  outside of the [place] are appropriate, unless specifically indicated otherwise. [employer]'s drug information does not endorse drugs, diagnose patients or recommend therapy. [employer]'s drug information is an [employer], corporate to assist licensed healthcare practitioners in caring for their patients and/or to serve consumers viewing this service as a supplement to, and not a substitute for, the expertise, skill, knowledge and judgment of healthcare practitioners. The absence of a warning for a given drug or drug combination in no way should be construed to indicate that the drug or drug combination is safe, effective or appropriate for any given patient. [employer] does not assume any responsibility for any aspect of healthcare administered with the aid of information [employer] provides. The information contained herein is not intended to cover all possible uses, directions, precautions, warnings, drug interactions, allergic reactions, or adverse effects. If you have questions about the drugs you are taking, check with your doctor, nurse or pharmacist.  Copyright [phone])[phone] [employer] Version: 3.01. Revision date: 12/13/2016.  Care instructions adapted under license by [facility]. If you have questions about a medical condition or this instruction, always ask your healthcare professional. [employer], Incorporated disclaims any warranty or liability for your use of this information.         Learning About Deep Vein Thrombosis  What is deep vein thrombosis?    A deep vein thrombosis (DVT) is a blood clot in certain veins of the legs, pelvis, or arms. The clot is usually in the legs. DVT may damage the vein and cause the area to ache, swell, and change color. DVT also can lead to sores.  DVT in these veins needs to be treated because the clots can get bigger, break loose, and travel through the bloodstream to the lungs. A blood clot in a lung can cause death.  Blood clots can form in the veins when you are not active for a long period of time. For example, they can form if you need to stay in bed because of a health problem or must sit for a long time on an airplane or in a car. Surgery or an injury can damage your blood vessels and cause a clot to form. Cancer also can cause DVT. And some people have blood that clots too easily, which is a problem that may run in families.  A risk factor is something that makes you more likely to develop a disease.  Here are some major risk factors for DVT:  ?? You have surgery.  ?? You have to stay in bed for more than 3 days (such as in the hospital).  ?? Your blood is likely to clot because of an injury, cancer, or inherited condition.  Here are some minor risk factors for DVT:  ?? You take birth control hormones.  ?? You are pregnant.  ?? You are in a car or airplane for a long trip.  What are the symptoms?  Symptoms of DVT may include:  ?? Swelling in the affected area.  ?? Redness and warmth in the affected area.  ?? Pain or tenderness. You may have pain only when you touch the affected area or when you stand or walk.  If your doctor thinks you may have DVT, you will probably have an ultrasound test. You may have other tests as well.  How can you prevent DVT?  ?? Exercise your lower leg muscles to help blood flow in your legs. Point your toes up toward your head so the calves of your  legs are stretched, then relax and repeat. This is a good exercise to do when you are sitting for long periods of time.  ?? Get out of bed as soon as you can after an illness or surgery. If you need to stay in bed, do the leg exercise noted above every hour when you are awake.  ?? Use special stockings called compression stockings. These stockings are tight at the feet with a gradually looser fit on the leg. Many doctors recommend that you wear compression stockings during a journey longer than 8 hours.  ?? Take breaks when you are on long trips. Stop the car and walk around. On long airplane flights, walk up and down the aisle hourly, and flex and point your feet every 20 minutes while sitting.  ?? Take blood-thinning medicines after some types of surgery if your doctor recommends it. Blood thinners also may be used if you are likely to develop clots.  How is DVT treated?  Treatment for DVT usually involves taking blood thinners. These medicines are given through a vein (intravenously, or IV) or as a pill. Talk with your doctor about which medicine is right for you.  Your doctor also may suggest that you prop up or elevate your leg when possible, take walks, and wear compression stockings. These measures may help reduce the pain and swelling that can happen with DVT.  Follow-up care is a key part of your treatment and safety. Be sure to make and go to all appointments, and call your doctor if you are having problems. It's also a good idea to know your test results and keep a list of the medicines you take.  Where can you learn more?  Go to [facility] at [email or web address]  Select Preferences in the upper right hand corner, then select Health Library under Resources. Enter [phone] in the search box to learn more about Learning About Deep Vein Thrombosis.  Current as of: May 02, 2017  Content Version: 11.9  ?? 2006-2018 [employer]wise, Inc[employer]. Care instructions adapted under license by [facility]. If you have questions about a medical condition or this instruction, always ask your healthcare professional. Healthwise, Incorporated disclaims any warranty or liability for your use of this information.

## 2017-10-11 NOTE — Unmapped (Signed)
Port accessed for lab purposes, then flushed and heparinized per protocol, then de-accessed. NAD, no needs voiced after labs drawn.    1305 Given results of CBC and has gone to radiology for stat US of neck.    1450 Pt returns from ultrasound with scan positive. Morris AGNP aware.    1530 Pt d/c'd to home with husband escorting. Prescription has been sent in by [facility], who explained the medication regiment.

## 2017-10-12 NOTE — Unmapped (Signed)
[facility] pharmacy was calling because the Eloquis starter pack was prescribed but they only have the 5mg  not in a starter pack. Can a new prescription be sent reading Eloquis 5 mg?  Thanks in advance,  Deatra Ina  [facility]  [phone]

## 2017-10-12 NOTE — Unmapped (Signed)
[facility] Triage Note     Patient: Erika Cabrera     Reason for call:  requesting clarification for  Eliquis RX    Time call returned: [phone]     Phone Assessment: returned call to [facility]. Spoke with Pharmacist was told that the issue was already resolved.         Triage Recommendations: NA     Patient Response: NA     Outstanding tasks: None  Patient Pharmacy has been verified and primary pharmacy has been marked as preferred

## 2017-10-12 NOTE — Unmapped (Signed)
Encounter addended by: Jamison Neighbor, RN on: 10/12/2017 10:45 AM<BR>    Actions taken: Charge Capture section accepted

## 2017-10-12 NOTE — Unmapped (Signed)
Pt is at the pharmacy now. Their fax is [phone].  Thanks in advance,  Deatra Ina  [facility]  [phone]

## 2017-10-15 NOTE — Unmapped (Signed)
Spoke with patient regarding questions about blood clot. She inquires whether there are any physical limitations she should be aware of while she has the blood clot. She endorses wanting to be able to pick up her grandchild. Informed her that this RN was not aware of any physical limitations, but would double check with the MD and NP to ensure. Provided education regarding s/s of the blood clot traveling and when she should seek medical attention.     Endorses that her neck is bruised and tender today. She inquires on how long it will take for the blood clot to go away. Informed her that this RN would have to discuss w/ the MD or NP, as this was not something this RN could answer.    Verbalizes that her fingertips and lips are now numb. First noticed that her lips were numb mid last week. States she discussed this w/ the infusion NP. She is able to chew without difficulty. All fingertips are numb and she is having difficulty typing/texting/writing. Denies any trouble buttoning shirts/zipping pants. States that at baseline, she has neuropathy in the balls of her feet, but is now feeling like she's wearing socks all of the time. Discussed normal side effects and that this is something we are going to want to be updated on frequently throughout her treatment. Encouraged her to reach out to the team if she notices any other symptoms, or worsening of her current symptoms. She verbalized understanding.    Informed her that this RN would be in touch either today or tomorrow morning by phone or by MyChart message, after speaking with the MD and NP. She verbalized understanding and thanked for the call.

## 2017-10-16 ENCOUNTER — Encounter
Admit: 2017-10-16 | Discharge: 2017-10-17 | Payer: PRIVATE HEALTH INSURANCE | Attending: Adult Health | Primary: Adult Health

## 2017-10-16 DIAGNOSIS — C50412 Malignant neoplasm of upper-outer quadrant of left female breast: Principal | ICD-10-CM

## 2017-10-18 NOTE — Unmapped (Signed)
Patient contacted the Communication Center requesting to speak with Dr. Avis Epley regarding a letter from [facility] and [facility] stating that Abraxane would not be covered by her insurance. She wanted to know if Dr. Avis Epley received this letter as well and what should take place after notified from [facility].     Patient may be reached at [phone].    Thanks in advance,  Jodi Mourning  [facility]  [phone]

## 2017-10-18 NOTE — Unmapped (Signed)
Patient Name: Erika Cabrera  Patient Age: 62 y.o.  Encounter Date: 10/16/2017    Referring Physician:   Cala Bradford, MD  [phone]  [facility]  [place], [place] [address]    Primary Care Provider:  Cala Bradford, MD    Breast Cancer Consulting Physicians:  Surgical Oncology: Sherilyn Cooter, DO   Surgical Oncology Nurse Practitioner: Pollyann Samples, NP  Radiation Oncology: Lorrin Mais, MD  Medical Oncology: Marc Morgans, MD      Reason for Visit: Post-operative visit    Diagnosis:   1. Left breast infiltrating ductal carcinoma  Cancer Staging  Malignant neoplasm of upper-outer quadrant of left female breast (CMS-HCC)  Staging form: Breast, AJCC 8th Edition  - Clinical stage from 06/20/2017: Stage IIA (cT2, cN1(f), cM0, G1, ER: Positive, PR: Positive, HER2: Negative) - Signed by Talbert Cage, DO on 06/26/2017  - Pathologic stage from 07/13/2017: Stage IB (pT3, pN2a(sn), cM0, G2, ER: Positive, PR: Positive, HER2: Negative) - Signed by Rudie Meyer, ANP on 07/19/2017      Procedure:   1. Left [facility] localized, US guided partial mastectomy and left sentinel lymph node biopsy with targeted axillary lymph node excision, performed on 07/03/2017.  2. Left re-excision partial mastectomy and axillary lymph node dissection with axillary reverse mapping, performed on 08/14/2017.     Clinical Trial Participant:   1. No clinical trials at this time.    Interim History:  Ms.Gargan returns to the office after the above noted procedure for her postoperative visit. She reports she is recovering well. Sheketa denies fevers, chills, bruising, redness and wound drainage postoperatively. Jaquelinne had a seroma catheter placed on 02/04, which was removed on 02/11.  She notes recurrence of the fluid in the axilla.  She reports she noticed this within days of her last aspiration on 2/26.  She has had two doses of chemotherapy.  She reports profound fatigue and neuropathies.  She has also developed a clot in her right IJ and is now on Eliquis.  No other specific complaints today.     Historically, Murlene presented with a self palpated left breast mass.     Pathology Review:  Final pathology revealed invasive mammary carcinoma with mixed ductal and lobular features present in association with in situ carcinoma with mixed ductal and lobular features, Nottingham grade 2, estrogen receptor 95% positive, progesterone receptor 30% positive, H2N FISH non-amplified measuring 8.3 cm in greatest dimension, final margins show invasive carcinoma present at the green inked lateral and yellow inked inferior margin(s). 5 of 6 sentinel lymph nodes demonstrated metastatic carcinoma, lymphovascular invasion was present, extracapsular extension was  absent and the largest focus of metastasis measured 10 mm.     On re-excision and ALND, pathology revealed: Lateral margin: Invasive carcinoma with mixed ductal and lobular features measuring 7mm w LVI, margin negative. Inferior margin: Multiple foci of lymphovasular invasion present.  No evidence of stromal invasion (final margin negative).  Left ALND: ??Two of twenty-three lymph nodes positive for carcinoma (2/23).  Size of larger tumor deposit: 6 mm.  Extracapsular extension: Absent  This was reviewed with [name]. We discussed the significance of this finding at great length. She states her understanding of this pathology report.  A copy of the pathology report was provided to [facility].     Oncology History    2018: Left breast cT2 N1a, IDC, G1, +/+/-.        Malignant neoplasm of upper-outer quadrant of  left female breast (CMS-HCC)    06/07/2017 -  Presenting Symptoms     Left breast palpable UOQ mass         06/07/2017 Interval Scan(s)     Diagnostic MMG at [facility]: left breast UOQ mass difficult to measure, but appears to measure at least 5.6 cm.    [place]: left breast ill defined shadowing lesion at 2:00, 6 CFN measuring 3.7 x 2.6 x 2.5 cm. Single mildly abnormal node in left axilla with constriction of fatty hilum.         06/13/2017 Biopsy     USG core biopsy of left breast 2:00, 6 CFN: IDC, G1, ER+(95%), PR+(30%), HER2 negative by FISH.  USG core biopsy left axillary LN: +LN metastasis.         06/13/2017 -  Other     Seen in [facility] ED following core biopsy in [facility] for axillary pain with non-expanding 4 cm hematoma noted in left axilla. No intervention required other than symptomatic measures (compression, pain management).         06/20/2017 Tumor Board     MDC recs: left breast cT2 N1a IDC, G1, receptors pending. Per outside imaging there was a single abnormal LN found by [place] only. Awaiting receptors. Need SAVI into that clipped node. Discussed that she otherwise fits ACOSOG Z0011 criteria with negative clinical exam prior to biopsy and single abnormal LN on axillary [place]. Reasonable to do SLNB and excise biopsied node as well. If receptors favorable, consider surgery first approach, good BCT candidate. If high risk (TN, HER+) may need NACT approach. Briefly discussed ALTERNATE, but consensus was that she will likely be recommended for chemo given age and nodal positivity. Meeting surgery, rad/onc today. Will circle back with med/onc as needed.         07/03/2017 Surgery     Left [facility] localized/US guided partial mastectomy and left sentinel lymph node biopsy with targeted axillary lymph node excision: Invasive mammary carcinoma with mixed ductal and lobular features present in association with in situ carcinoma with mixed ductal and lobular features measuring 8.3 cm with positive inferior and lateral margin. Sentinel lymph node biopsy: 1/1 node positive for metastatic disease measuring 7 mm. No ECE. Palpable axillary lymph nodes: 4/4 lymph nodes with metastatic disease measuring 10 mm.  No ECE.  Additional palpable axillary lymph node 0/1 with isolated tumor cells. SAVI scout in the axilla was defective and there was no localization signal from the SAVI probe    ??    ??           08/14/2017 Surgery     Left re-excision, ALND w ARM: Lateral margin: Invasive carcinoma with mixed ductal and lobular features measuring 7mm w LVI, margin negative.   Inferior margin: Multiple foci of lymphovasular invasion present.  No evidence of stromal invasion (final margin negative)  Left ALND: ??Two of twenty-three lymph nodes positive for carcinoma (2/23).  Size of larger tumor deposit: 6 mm.  Extracapsular extension: Absent         09/21/2017 -  Chemotherapy     Started ddTaxol--->ddAC         09/21/2017 Adverse Reaction     First Taxol infused for ~20 minutes resulting in flushing with chest tightness and nausea.  Infusion was stopped and administered.  Symptoms subsided within 20 minutes but she continued to feel some chest tightness with deep inspiration.  Albuterol nebulizer was given.  She then returned to baseline and infusion was  restarted at 1/2 rate and titrated up slowly.  She was able to complete infusion without any further events.             Physical Exam:  Blood pressure 129/61, pulse 83, temperature 36.9 ??C (98.4 ??F), temperature source Oral, resp. rate 20.  General Appearance: No acute distress, well appearing and well nourished.   Breast: The breasts are symmetrical. The left 2 o'clock incision and left axillary incision is clean, dry, intact and healing well.  There is no underlying hematoma or seroma in the breast.  There is a small seroma in the axilla.  65ml of clear yellow fluid was aspirated and discarded.         Assessment:  Ms. Morocco is a 62 y.o. female with a left breast infiltrating ductal carcinoma, estrogen receptor positive, progesterone receptor positive and HER2 negative, who is recovering well after left [facility] localized, US guided partial mastectomy and left sentinel lymph node biopsy with targeted axillary lymph node excision.  Cancer Staging  Malignant neoplasm of upper-outer quadrant of left female breast (CMS-HCC) Staging form: Breast, AJCC 8th Edition  - Clinical stage from 06/20/2017: Stage IIA (cT2, cN1(f), cM0, G1, ER: Positive, PR: Positive, HER2: Negative) - Signed by Talbert Cage, DO on 06/26/2017  - Pathologic stage from 07/13/2017: Stage IB (pT3, pN2a(sn), cM0, G2, ER: Positive, PR: Positive, HER2: Negative) - Signed by Rudie Meyer, ANP on 07/19/2017      Plan:  Recurrent seroma.  This continues to recur despite compression and limiting repetitive movements/heavy lifting.  As she is undergoing chemotherapy, I did not replace a seroma catheter or perform an I&D due to concern for infection.  I will have her RTC on Monday for reevaluation with Dr. Dellis Anes and discussion of ongoing management.   Otherwise will follow-up in Nov with mammogram    At the conclusion of our visit all of Floria's questions and concerns were addressed and arrangements were made for follow up US discussed above. She was instructed call us with any further questions, concerns or issues.    Cancer Staging  Malignant neoplasm of upper-outer quadrant of left female breast (CMS-HCC)  Staging form: Breast, AJCC 8th Edition  - Clinical stage from 06/20/2017: Stage IIA (cT2, cN1(f), cM0, G1, ER: Positive, PR: Positive, HER2: Negative) - Signed by Talbert Cage, DO on 06/26/2017  - Pathologic stage from 07/13/2017: Stage IB (pT3, pN2a(sn), cM0, G2, ER: Positive, PR: Positive, HER2: Negative) - Signed by Rudie Meyer, ANP on 07/19/2017      Pollyann Samples, NP-C  Surgical Breast Oncology  10/18/2017  11:37 AM

## 2017-10-19 NOTE — Unmapped (Signed)
[facility] Triage Note     Patient: Erika Cabrera     Reason for call:  insurance coverage  Time call returned: 0901     Phone Assessment: Spoke with [name], She has received a letter from her insurance, stating the Abraxane infusion would not be covered by insurance. She has had an infusion on  3/1 with next one scheduled 3/22.  Asking what the status of this would be? The treatment had been changed due to an allergic reaction.    Triage Recommendations: will message team     Patient Response: verbalized understanding     Outstanding tasks: Ms. Livingston has been told the abraxane is not covered by her insurance, please advise     Patient Pharmacy has been verified and primary pharmacy has been marked as preferred

## 2017-10-22 ENCOUNTER — Encounter: Admit: 2017-10-22 | Discharge: 2017-10-23 | Payer: PRIVATE HEALTH INSURANCE

## 2017-10-22 DIAGNOSIS — Z170 Estrogen receptor positive status [ER+]: Secondary | ICD-10-CM

## 2017-10-22 DIAGNOSIS — C50412 Malignant neoplasm of upper-outer quadrant of left female breast: Principal | ICD-10-CM

## 2017-10-22 NOTE — Unmapped (Signed)
1505  Spoke with Lacresha, told her Abraxane is now covered by her insurance, with a start date of 2/28.  Thankful for call.

## 2017-10-22 NOTE — Unmapped (Signed)
Patient Name: Erika Cabrera  Patient Age: 62 y.o.  Encounter Date: 10/22/2017    Referring Physician:   Talbert Cage, DO  [address]  [id number]  [place], [place] [address]    Primary Care Provider:  Cala Bradford, MD    Breast Cancer Consulting Physicians:  Surgical Oncology: Sherilyn Cooter, DO   Surgical Oncology Nurse Practitioner: Pollyann Samples, NP  Radiation Oncology: Lorrin Mais, MD  Medical Oncology: Marc Morgans, MD      Reason for Visit: Post-operative visit    Diagnosis:   1. Left breast infiltrating ductal carcinoma  Cancer Staging  Malignant neoplasm of upper-outer quadrant of left breast in female, estrogen receptor positive (CMS-HCC)  Staging form: Breast, AJCC 8th Edition  - Clinical stage from 06/20/2017: Stage IIA (cT2, cN1(f), cM0, G1, ER: Positive, PR: Positive, HER2: Negative) - Signed by Talbert Cage, DO on 06/26/2017  - Pathologic stage from 07/13/2017: Stage IB (pT3, pN2a(sn), cM0, G2, ER: Positive, PR: Positive, HER2: Negative) - Signed by Rudie Meyer, ANP on 07/19/2017      Procedure:   1. Left [facility] localized, US guided partial mastectomy and left sentinel lymph node biopsy with targeted axillary lymph node excision, performed on 07/03/2017.  2. Left re-excision partial mastectomy and axillary lymph node dissection with axillary reverse mapping, performed on 08/14/2017.     Clinical Trial Participant:   1. No clinical trials at this time.    Interim History:  Ms.Curfman returns to the office after the above noted procedure for her postoperative visit. She reports she is recovering well. Kahlan denies fevers, chills, bruising, redness and wound drainage postoperatively. Kuuipo had a seroma catheter placed on 02/04, which was removed on 02/11.  She has had several bedside aspirations since the seroma catheter was removed. Her last aspiration was on 10/16/17 with Pollyann Samples and drained 50 cc. She notes over the past few days she has noted increased swelling of the breast and axilla. She is currently receiving chemotherapy and is still reporting profound fatigue and neuropathies.  She has also developed a clot in her right IJ and is now on Eliquis.      On exam today, she does have noted tissue swelling, but no palpable fluid collection today. I will refer her for formal US in radiology to quantify amount of fluid and possible aspiration. We also discussed starting some stretches to improve ROM and referral to PT/lymphedema for massage and stretching of the breast and upper extremity to help reestablish lymphatic flow.     Historically, Cynthya presented with a self palpated left breast mass.     Pathology Review:  Final pathology revealed invasive mammary carcinoma with mixed ductal and lobular features present in association with in situ carcinoma with mixed ductal and lobular features, Nottingham grade 2, estrogen receptor 95% positive, progesterone receptor 30% positive, H2N FISH non-amplified measuring 8.3 cm in greatest dimension, final margins show invasive carcinoma present at the green inked lateral and yellow inked inferior margin(s). 5 of 6 sentinel lymph nodes demonstrated metastatic carcinoma, lymphovascular invasion was present, extracapsular extension was  absent and the largest focus of metastasis measured 10 mm.     On re-excision and ALND, pathology revealed: Lateral margin: Invasive carcinoma with mixed ductal and lobular features measuring 7mm w LVI, margin negative. Inferior margin: Multiple foci of lymphovasular invasion present.  No evidence of stromal invasion (final margin negative).  Left ALND: ??Two of twenty-three lymph nodes positive for carcinoma (2/23).  Size  of larger tumor deposit: 6 mm.  Extracapsular extension: Absent  This was reviewed with [name]. We discussed the significance of this finding at great length. She states her understanding of this pathology report.  A copy of the pathology report was provided to [facility].     Oncology History    2018: Left breast cT2 N1a, IDC, G1, +/+/-.        Malignant neoplasm of upper-outer quadrant of left breast in female, estrogen receptor positive (CMS-HCC)    06/07/2017 -  Presenting Symptoms     Left breast palpable UOQ mass         06/07/2017 Interval Scan(s)     Diagnostic MMG at [facility]: left breast UOQ mass difficult to measure, but appears to measure at least 5.6 cm.    [place]: left breast ill defined shadowing lesion at 2:00, 6 CFN measuring 3.7 x 2.6 x 2.5 cm.     Single mildly abnormal node in left axilla with constriction of fatty hilum.         06/13/2017 Biopsy     USG core biopsy of left breast 2:00, 6 CFN: IDC, G1, ER+(95%), PR+(30%), HER2 negative by FISH.  USG core biopsy left axillary LN: +LN metastasis.         06/13/2017 -  Other     Seen in [facility] ED following core biopsy in [facility] for axillary pain with non-expanding 4 cm hematoma noted in left axilla. No intervention required other than symptomatic measures (compression, pain management).         06/20/2017 Tumor Board     MDC recs: left breast cT2 N1a IDC, G1, receptors pending. Per outside imaging there was a single abnormal LN found by [place] only. Awaiting receptors. Need SAVI into that clipped node. Discussed that she otherwise fits ACOSOG Z0011 criteria with negative clinical exam prior to biopsy and single abnormal LN on axillary [place]. Reasonable to do SLNB and excise biopsied node as well. If receptors favorable, consider surgery first approach, good BCT candidate. If high risk (TN, HER+) may need NACT approach. Briefly discussed ALTERNATE, but consensus was that she will likely be recommended for chemo given age and nodal positivity. Meeting surgery, rad/onc today. Will circle back with med/onc as needed.         07/03/2017 Surgery     Left [facility] localized/US guided partial mastectomy and left sentinel lymph node biopsy with targeted axillary lymph node excision: Invasive mammary carcinoma with mixed ductal and lobular features present in association with in situ carcinoma with mixed ductal and lobular features measuring 8.3 cm with positive inferior and lateral margin. Sentinel lymph node biopsy: 1/1 node positive for metastatic disease measuring 7 mm. No ECE. Palpable axillary lymph nodes: 4/4 lymph nodes with metastatic disease measuring 10 mm.  No ECE.  Additional palpable axillary lymph node 0/1 with isolated tumor cells. SAVI scout in the axilla was defective and there was no localization signal from the SAVI probe.         08/14/2017 Surgery     Left re-excision, ALND w ARM: Lateral margin: Invasive carcinoma with mixed ductal and lobular features measuring 7mm w LVI, margin negative.   Inferior margin: Multiple foci of lymphovasular invasion present.  No evidence of stromal invasion (final margin negative)  Left ALND: ??Two of twenty-three lymph nodes positive for carcinoma (2/23).  Size of larger tumor deposit: 6 mm.  Extracapsular extension: Absent         09/21/2017 -  Chemotherapy  Started ddTaxol--->ddAC         09/21/2017 Adverse Reaction     First Taxol infused for ~20 minutes resulting in flushing with chest tightness and nausea.  Infusion was stopped and administered.  Symptoms subsided within 20 minutes but she continued to feel some chest tightness with deep inspiration.  Albuterol nebulizer was given.  She then returned to baseline and infusion was restarted at 1/2 rate and titrated up slowly.  She was able to complete infusion without any further events.             Physical Exam:  Blood pressure 136/65, pulse 98, temperature 36.6 ??C (97.9 ??F), temperature source Temporal, weight 94.7 kg (208 lb 11.2 oz), SpO2 98 %.  General Appearance: No acute distress, well appearing and well nourished.   Breast: The breasts are symmetrical. The left periareolar incision and left axillary incision is clean, dry, intact and healing well.  There is no underlying hematoma or seroma in the breast.  There is soft tissue swelling, but no palpable seroma today in the axilla.         Assessment:  Ms. Yazdani is a 62 y.o. female with a left breast infiltrating ductal carcinoma, estrogen receptor positive, progesterone receptor positive and HER2 negative, who is recovering well after left [facility] localized, US guided partial mastectomy and left sentinel lymph node biopsy with targeted axillary lymph node excision.  Cancer Staging  Malignant neoplasm of upper-outer quadrant of left breast in female, estrogen receptor positive (CMS-HCC)  Staging form: Breast, AJCC 8th Edition  - Clinical stage from 06/20/2017: Stage IIA (cT2, cN1(f), cM0, G1, ER: Positive, PR: Positive, HER2: Negative) - Signed by Talbert Cage, DO on 06/26/2017  - Pathologic stage from 07/13/2017: Stage IB (pT3, pN2a(sn), cM0, G2, ER: Positive, PR: Positive, HER2: Negative) - Signed by Rudie Meyer, ANP on 07/19/2017      Plan:  Formal US in radiology today as there is no palpable fluid collection.    Referral to PT/lymphedema for education and massage of the breast and chest wall to help with lymphatic flow. She also has difficulty lifting left arm above shoulder level and they will work with her for increased ROM of the left upper extremity.  Return in 2 weeks for re-evaluation.   Otherwise will follow-up in Nov with mammogram    At the conclusion of our visit all of Talisha's questions and concerns were addressed and arrangements were made for follow up US discussed above. She was instructed call us with any further questions, concerns or issues.    Cancer Staging  Malignant neoplasm of upper-outer quadrant of left female breast (CMS-HCC)  Staging form: Breast, AJCC 8th Edition  - Clinical stage from 06/20/2017: Stage IIA (cT2, cN1(f), cM0, G1, ER: Positive, PR: Positive, HER2: Negative) - Signed by Talbert Cage, DO on 06/26/2017  - Pathologic stage from 07/13/2017: Stage IB (pT3, pN2a(sn), cM0, G2, ER: Positive, PR: Positive, HER2: Negative) - Signed by Rudie Meyer, ANP on 07/19/2017      Royann Shivers, DO, FACOS, FACS  Surgical Breast Oncology and Oncoplastics  10/22/2017  9:28 AM

## 2017-10-22 NOTE — Unmapped (Signed)
Here to fu with dr.States she will need aspiration today.

## 2017-10-22 NOTE — Unmapped (Signed)
It was a pleasure to see you today in the [facility]. Please call my nurse navigator, Lindwood Qua, if you have any interval questions or concerns.     1. You will have a left axillary [place] and possible aspiration today in radiology.  2. You will be referred to PT/lymphedema for education, massage, and ROM of the left arm.  3. Return in 2 weeks for re-check.     AFTER MASTECTOMY ARM EXERCISES  The following exercises are recommended for the arm on the same side as your breast cancer surgery.     ARM STRETCHES  1.  With your arm straight, slowly raise it in FRONT of you until you feel gentle resistance or discomfort.  Hold it there and slowly count to 5.  Slowly lower it back down to your side.  Repeat this 2 more times.  2.  With your arm straight, slowly raise it OUT TO YOUR SIDE until you feel gentle resistance or discomfort.  Hold it there and slowly count to 5.  Lower your arm slowly back down to your side.  Repeat this 2 more times.  3.  With your arm straight, slowly raise it out BEHIND you until you feel gentle resistance or discomfort.  Hold it there and slowly count to 5.  Lower your arm slowly back down to your side.  Repeat this 2 more times.     ARM CIRCLES  1.  With your arm straight, slowly raise it in front of you until you feel gentle resistance or discomfort.  Rotate your arm in a circular pattern, like you were circling a basketball with it.  First rotate your arm to the left for 5 circles, then to the right for 5 more.  Lower your arm gently back to your side.  Repeat the circles with your arm out to your side and again with your arm extended behind you.     FINGER WALKIING  1.  Stand near a wall with your arm straight out in front of you.  Let your fingers touch the wall.  Gently walk your fingers up the wall.  Stop at the point where you feel gentle resistance or discomfort.  Then, walk your fingers back down the wall.  Repeat this same motion with your arm out to the side. 2.  Repeat this exercise, walking the palm of your hand up the wall.     You may find it easier to do these exercises in a warm shower, especially the first few times you do them.  If you have questions about these exercises, contact your doctor or nurse.      Surgical oncology contact information:  For appointments, please call [phone].     Nurse Navigator available Monday through Friday 8:30 am to 4:30 pm:   Lindwood Qua, RN:    Phone: [phone]   Pager: [phone]; when prompted type in [phone] to access the appropriate person. You will be prompted to dial in your return phone number.   Fax: [phone] attention Vivia Budge    For emergencies, evenings or weekends, please call [phone] and ask for the surgical oncology resident on call. Please be aware that this person is also responding to in-hospital emergencies and patient issues and may not answer your phone call immediately, but will return your call as soon as possible.

## 2017-10-24 ENCOUNTER — Encounter: Admit: 2017-10-24 | Discharge: 2017-10-25 | Payer: PRIVATE HEALTH INSURANCE

## 2017-10-24 DIAGNOSIS — Z170 Estrogen receptor positive status [ER+]: Secondary | ICD-10-CM

## 2017-10-24 DIAGNOSIS — C50412 Malignant neoplasm of upper-outer quadrant of left female breast: Principal | ICD-10-CM

## 2017-10-25 ENCOUNTER — Encounter
Admit: 2017-10-25 | Discharge: 2017-11-23 | Payer: PRIVATE HEALTH INSURANCE | Attending: Rehabilitative and Restorative Service Providers" | Primary: Rehabilitative and Restorative Service Providers"

## 2017-10-25 DIAGNOSIS — C50412 Malignant neoplasm of upper-outer quadrant of left female breast: Principal | ICD-10-CM

## 2017-10-25 DIAGNOSIS — Z170 Estrogen receptor positive status [ER+]: Secondary | ICD-10-CM

## 2017-10-25 DIAGNOSIS — I890 Lymphedema, not elsewhere classified: Secondary | ICD-10-CM

## 2017-10-25 NOTE — Unmapped (Signed)
OUTPATIENT PHYSICALTHERAPY  Upper Quadrant Lymphedema   EVALUATION AND   PLAN OF CARE    Date:10/25/2017    Patient Name: Erika Cabrera  Date of Birth:12-05-1955  Session #: 1  Diagnosis:   Encounter Diagnosis   Name Primary?   ??? Malignant neoplasm of upper-outer quadrant of left breast in female, estrogen receptor positive (CMS-HCC)      Onset:  Referring Provider: Ophelia Shoulder*    Initial Evaluation and POC certification : 10/25/17-01/23/18      Contraindications:  none    Precautions/Red Flags:  history of cancer      ASSESSMENT/Reason for Referral:    62 y.o. female presents with Stage  II  lymphedema secondary  In chest and under axilla . Currently the affected limb is 6.75 %  smaller than the unaffected limb. Pt will benefit from lymphedema therapy to address : education in self care, precautions, and management of symptoms, edema, soft tissue changes,  fitting of appropriate compression garments, and progression of  exercises to return to PLOF.      Recommendations for treatment/garments:  1) Patient will obtain proper garments to address swelling and improve tissue integrity. Garment recommendations for   upper extremity:20-30 mm hg Elvarex soft garment ( flat knit) plus a night time jovi pak with cover sleeve. Post lumpectomy jovi pak and bellisse bra.  Arm sleeve for rep movement, airplane travel, exercise.   2) Pt will perform self MLD 2x daily.   3) Pt to perform daily skin checks/ skin care to check for signs and symptoms of infection.  4) Pt will be seen clinically for  MLD to decrease swelling and improve skin integrity      Patient requires skilled Physical Therapy services  for the following problem list and secondary functional limitations:    Problem List:   Lack of knowledge of lymphedema and self care of this condition, Increased risk of infection, Lack of appropriate compression garment and Uncontrolled lymphedema swelling    Secondary Functional Limitations:  Decreased  ROM of arm and Scar/Myofascial  immobility    Barriers to learning:  no        Patient Goals: Decrease swelling, obtain a compression garment and decrease stiffness    Physical Therapy Goals:   In 3-4 sessions    Pt will have knowledge of proper skin care and lymphedema risk precautions to reduce risk of infection.  Patient will be able to properly demonstrate self-manual lymphatic drainage x1 in clinic to assist in management of lymphedema swelling  Pt. will independently don/doff and tolerate compression garment or compression bandages x1 in clinic  to ensure independent management of lymphedema.  Patient will demonstrate proper posture  to minimize pain and tightness in the affected quadrant.  MLD will be performed clinically to improve lymphangiomotoricity, thereby rerouting lymphatic flow to accomodate cancer treatment effects on the lymphatic system.    In 1-6 months:    Pt will be independent with all self care related to lymphedema.      Prognosis for goal achievement:   Good due to good motivation.        PLAN:   Within a 90 day certification period, schedule permitting, decreasing frequency as able, patient will participate in the following as needed: Manual lymphatic drainage, compression bandaging or use of reduction kits, appropriate garment recommendations, skin care and therapeutic exercise, manual therapy, low level laser therapy, ROM and strength training, education on posture and body mechanics, taping, and pump/ equipment  recommendations  Planned frequency and duration  of treatment:    starting in 3 weeks 2x week for 2 weeks then 1x week for 6 weeks.   SUBJECTIVE:  Patient???s communication preference: verbal, written, visual, prn   History of Present Illness/ Pt reports:   Pt reports recurrent Seroma since drains removed . Has been drained multiple times 1s time 360 ml, second time 310,  35 mil, 3rd time 55 ml, 4th time 48 ml.  Pt reports has not noticed swelling in extremity.  Some ROM  Deficits/ tightness noted.     Onset of swelling:  Since drains removed.      Location of pain: No pain  Current Pain:  0/10     Home environment:       Lives with family. Has two kids in college   Sleeping position: bed     Pt has caregiver available, capable and willing to help:yes     Occupation/ Activities/Recreation:      Occupational History  None on file       Prior Functional Status:   Independent    Current Functional Limitations: The patient reports the following limitations based on verbal description and a Quick DASH score of  which indicates 6% impairment of the upper extremity:  arm, shoulder, and hand pain      Sexual Dysfunction or Bowel or Bladder changes : none    History of infections: no      Previous Lymphedema Treatment Type: none    Current management: n/a           Medical hx/conditions/surgical procedures, medications, allergies:    Reviewed      Past Medical History:   Diagnosis Date   ??? Breast cancer (CMS-HCC) 06/2017    Left-Per bx results in [facility]   ??? Depression    ??? GERD (gastroesophageal reflux disease)     Family History   Problem Relation Age of Onset   ??? No Known Problems Mother    ??? No Known Problems Father    ??? No Known Problems Sister    ??? No Known Problems Daughter    ??? No Known Problems Maternal Grandmother    ??? No Known Problems Maternal Grandfather    ??? No Known Problems Paternal Grandmother    ??? No Known Problems Paternal Grandfather    ??? Breast cancer Paternal Aunt         41s   ??? Colon cancer Paternal Aunt    ??? BRCA 1/2 Neg Hx    ??? Cancer Neg Hx    ??? Endometrial cancer Neg Hx    ??? Ovarian cancer Neg Hx       Past Surgical History:   Procedure Laterality Date   ??? ANAL FISSURECTOMY     ??? BREAST BIOPSY Left 06/2017    malignant   ??? BREAST BIOPSY Left 06/2017    Lympnode bx-positive   ??? IR INSERT PORT AGE GREATER THAN 5 YRS  09/28/2017    IR INSERT PORT AGE GREATER THAN 5 YRS 09/28/2017 Rush Barer, MD IMG VIR [facility]   ??? PR BX/REMV,LYMPH NODE,DEEP AXILL Left 07/03/2017 Procedure: BX/EXC LYMPH NODE; OPEN, DEEP AXILRY NODE;  Surgeon: Talbert Cage, DO;  Location: ASC OR [facility];  Service: Surgical Oncology   ??? PR INTRAOPERATIVE SENTINEL LYMPH NODE ID W DYE INJECTION Left 07/03/2017    Procedure: INTRAOPERATIVE IDENTIFICATION SENTINEL LYMPH NODE(S) INCLUDE INJECTION NON-RADIOACTIVE DYE, WHEN PERFORMED;  Surgeon: Talbert Cage, DO;  Location: ASC OR [facility];  Service: Surgical Oncology   ??? PR INTRAOPERATIVE SENTINEL LYMPH NODE ID W DYE INJECTION Left 08/14/2017    Procedure: INTRAOPERATIVE IDENTIFICATION SENTINEL LYMPH NODE(S) INCLUDE INJECTION NON-RADIOACTIVE DYE, WHEN PERFORMED;  Surgeon: Talbert Cage, DO;  Location: ASC OR [facility];  Service: Surgical Oncology Breast   ??? PR MASTECTOMY, PARTIAL Left 07/03/2017    Procedure: MASTECTOMY, savi scout PARTIAL (EG, LUMPECTOMY, TYLECTOMY, QUADRANTECTOMY, SEGMENTECTOMY);  Surgeon: Talbert Cage, DO;  Location: ASC OR [facility];  Service: Surgical Oncology   ??? PR MASTECTOMY, PARTIAL Left 08/14/2017    Procedure: MASTECTOMY, PARTIAL (EG, LUMPECTOMY, TYLECTOMY, QUADRANTECTOMY, SEGMENTECTOMY) Re-excision;  Surgeon: Talbert Cage, DO;  Location: ASC OR [facility];  Service: Surgical Oncology Breast   ??? PR REMOVE ARMPITS LYMPH NODES COMPLT Left 08/14/2017    Procedure: AXILLARY LYMPHADENECTOMY; COMPLETE;  Surgeon: Talbert Cage, DO;  Location: ASC OR [facility];  Service: Surgical Oncology Breast    Allergies   Allergen Reactions   ??? Erythromycin Diarrhea   ??? Penicillins Rash      Social History   Substance Use Topics   ??? Smoking status: Never Smoker   ??? Smokeless tobacco: Never Used   ??? Alcohol use No    Current Outpatient Prescriptions   Medication Sig Dispense Refill   ??? acetaminophen (TYLENOL) 500 MG tablet Take 1,000 mg by mouth every four (4) hours as needed for pain.     ??? apixaban (ELIQUIS) 5 mg (74 tabs) DsPk The first 7 days of treatment, take 10mg  (2 tablets) by mouth twice a day after first week take 5mg  (1 tablet) twice a day. 74 tablet 0   ??? buPROPion (WELLBUTRIN XL) 300 MG 24 hr tablet Take 300 mg by mouth daily.     ??? docusate sodium (COLACE) 100 MG capsule Take 100 mg by mouth daily with breakfast.     ??? ELIQUIS 5 mg Tab   0   ??? FLUoxetine (PROZAC) 10 MG tablet Take 10 mg by mouth daily.     ??? GABAPENTIN ORAL Take 900 mg by mouth nightly. Pt taking 1 capsule at noon and 3 capsules at 8PM     ??? loratadine (CLARITIN ORAL) Take by mouth.     ??? ondansetron (ZOFRAN) 8 MG tablet Take 1 tablet (8 mg total) by mouth every twelve (12) hours as needed for nausea. (Patient not taking: Reported on 10/05/2017) 30 tablet 2   ??? ranitidine (ZANTAC) 150 MG tablet Take 150 mg by mouth daily.        No current facility-administered medications for this visit.           Recent Procedures/Tests/Findings:  Oncology History    2018: Left breast cT2 N1a, IDC, G1, +/+/-.        Malignant neoplasm of upper-outer quadrant of left breast in female, estrogen receptor positive (CMS-HCC)    06/07/2017 -  Presenting Symptoms     Left breast palpable UOQ mass         06/07/2017 Interval Scan(s)     Diagnostic [facility]G at [facility]: left breast UOQ mass difficult to measure, but appears to measure at least 5.6 cm.    [place]: left breast ill defined shadowing lesion at 2:00, 6 CFN measuring 3.7 x 2.6 x 2.5 cm.     Single mildly abnormal node in left axilla with constriction of fatty hilum.         06/13/2017 Biopsy     [facility] core biopsy of left breast 2:00,  6 CFN: IDC, G1, ER+(95%), PR+(30%), HER2 negative by FISH.  [facility] core biopsy left axillary LN: +LN metastasis.         06/13/2017 -  Other     Seen in [facility] ED following core biopsy in [facility] for axillary pain with non-expanding 4 cm hematoma noted in left axilla. No intervention required other than symptomatic measures (compression, pain management).         06/20/2017 Tumor Board     [facility] recs: left breast cT2 N1a IDC, G1, receptors pending. Per outside imaging there was a single abnormal LN found by [place] only. Awaiting receptors. Need SAVI into that clipped node. Discussed that she otherwise fits ACOSOG Z0011 criteria with negative clinical exam prior to biopsy and single abnormal LN on axillary [place]. Reasonable to do SLNB and excise biopsied node as well. If receptors favorable, consider surgery first approach, good BCT candidate. If high risk (TN, HER+) may need NACT approach. Briefly discussed ALTERNATE, but consensus was that she will likely be recommended for chemo given age and nodal positivity. Meeting surgery, rad/onc today. Will circle back with med/onc as needed.         07/03/2017 Surgery     Left [facility] localized/US guided partial mastectomy and left sentinel lymph node biopsy with targeted axillary lymph node excision: Invasive mammary carcinoma with mixed ductal and lobular features present in association with in situ carcinoma with mixed ductal and lobular features measuring 8.3 cm with positive inferior and lateral margin. Sentinel lymph node biopsy: 1/1 node positive for metastatic disease measuring 7 mm. No ECE. Palpable axillary lymph nodes: 4/4 lymph nodes with metastatic disease measuring 10 mm.  No ECE.  Additional palpable axillary lymph node 0/1 with isolated tumor cells. SAVI scout in the axilla was defective and there was no localization signal from the SAVI probe.         08/14/2017 Surgery     Left re-excision, ALND w ARM: Lateral margin: Invasive carcinoma with mixed ductal and lobular features measuring 7mm w LVI, margin negative.   Inferior margin: Multiple foci of lymphovasular invasion present.  No evidence of stromal invasion (final margin negative)  Left ALND: ??Two of twenty-three lymph nodes positive for carcinoma (2/23).  Size of larger tumor deposit: 6 mm.  Extracapsular extension: Absent         09/21/2017 -  Chemotherapy     Started ddTaxol--->ddAC         09/21/2017 Adverse Reaction     First Taxol infused for ~20 minutes resulting in flushing with chest tightness and nausea.  Infusion was stopped and administered.  Symptoms subsided within 20 minutes but she continued to feel some chest tightness with deep inspiration.  Albuterol nebulizer was given.  She then returned to baseline and infusion was restarted at 1/2 rate and titrated up slowly.  She was able to complete infusion without any further events.               OBJECTIVE:      Girth Measurements:          Location of swelling:  L chest and axilla.     Skin: WFL except for the following:  increased skin thickness    Stemmer???s sign:no.      Sensation Intact to light touch:   Decreased sensation under axilla, around scar, and posterior lateral upper arm     Incision: healing appropriately for post op status, adhered to underlying tissue    Axillary Web Syndrome::yes  Posture/Observations:  Forward head, rounded shoulders        Range of Motion/Flexibilty:   UE WFL except  End range overhead activity.     Strength/MMT:   UE WFL     Total Treatment Time: 60 min    PT Evaluation: 45 min    Treatment Rendered:      Self Care x 15 min   Patient Education:Pt was educated regarding the following:  lymphedema etiology  HEP  importance of therapy  treatment options and plan  skin care  lymphedema precautions  self-manual lymphatic drainage  equipment recommendations  garment options  Indications/Contraindications to treatment  Community Resources     Patient demonstrated and verbalized agreement and understanding.   Given written information as needed.        Today's Charges (noted here with $$):                          Data from the patient???s history and examination indicates the presence of 1-2 personal factors and/or comorbidities that will impact the plan of care for the current problem, including cancer history Examination of body systems includes 4 or more  body structures, functions, activity limitations and/or participation restrictions including integumentary system, lymphatic system, self care and scar mobility. The assessed clinical presentation is evolving based on  changing lymphatic status. These factors result in the need for moderate clinical decision making.    Equipment provided/recommended:   written HEP    Communication/consultation with other professionals:  Email to DME supplier [employer] re: garment needs        I attest that I have reviewed the above information.  Signed: Kirke Corin, PT   10/25/2017 3:17 PM

## 2017-10-26 ENCOUNTER — Encounter: Admit: 2017-10-26 | Discharge: 2017-10-27 | Payer: PRIVATE HEALTH INSURANCE

## 2017-10-26 ENCOUNTER — Encounter: Admit: 2017-10-26 | Discharge: 2017-10-27 | Payer: PRIVATE HEALTH INSURANCE | Attending: Family | Primary: Family

## 2017-10-26 DIAGNOSIS — Z170 Estrogen receptor positive status [ER+]: Secondary | ICD-10-CM

## 2017-10-26 DIAGNOSIS — C50412 Malignant neoplasm of upper-outer quadrant of left female breast: Principal | ICD-10-CM

## 2017-10-26 LAB — HEPATIC FUNCTION PANEL
ALBUMIN: 4.3 g/dL (ref 3.5–5.0)
ALKALINE PHOSPHATASE: 65 U/L (ref 38–126)
AST (SGOT): 31 U/L (ref 14–38)
BILIRUBIN DIRECT: 0.2 mg/dL (ref 0.00–0.40)
PROTEIN TOTAL: 6.5 g/dL (ref 6.5–8.3)

## 2017-10-26 LAB — CBC W/ AUTO DIFF
BASOPHILS ABSOLUTE COUNT: 0.1 10*9/L (ref 0.0–0.1)
BASOPHILS RELATIVE PERCENT: 1 %
EOSINOPHILS ABSOLUTE COUNT: 0.1 10*9/L (ref 0.0–0.4)
EOSINOPHILS RELATIVE PERCENT: 0.7 %
HEMATOCRIT: 40.1 % (ref 36.0–46.0)
LARGE UNSTAINED CELLS: 2 % (ref 0–4)
LYMPHOCYTES ABSOLUTE COUNT: 2.5 10*9/L (ref 1.5–5.0)
LYMPHOCYTES RELATIVE PERCENT: 28.4 %
MEAN CORPUSCULAR HEMOGLOBIN CONC: 33.9 g/dL (ref 31.0–37.0)
MEAN CORPUSCULAR HEMOGLOBIN: 28.5 pg (ref 26.0–34.0)
MEAN CORPUSCULAR VOLUME: 84.2 fL (ref 80.0–100.0)
MEAN PLATELET VOLUME: 7.5 fL (ref 7.0–10.0)
MONOCYTES ABSOLUTE COUNT: 0.6 10*9/L (ref 0.2–0.8)
MONOCYTES RELATIVE PERCENT: 6.6 %
NEUTROPHILS ABSOLUTE COUNT: 5.4 10*9/L (ref 2.0–7.5)
PLATELET COUNT: 282 10*9/L (ref 150–440)
RED BLOOD CELL COUNT: 4.77 10*12/L (ref 4.00–5.20)
WBC ADJUSTED: 8.8 10*9/L (ref 4.5–11.0)

## 2017-10-26 LAB — ALT (SGPT): Alanine aminotransferase:CCnc:Pt:Ser/Plas:Qn:: 42

## 2017-10-26 LAB — MEAN CORPUSCULAR HEMOGLOBIN: Lab: 28.5

## 2017-10-26 NOTE — Unmapped (Signed)
Pt to chair 41 in NAD. Port accessed prior to arrival; pre-meds given - pt reports worsening neuropathy, medical team aware; discussed icing fingers prior to, during and after infusion.    Pt completed treatment without complications. Fingers iced before, during and for 10 min after treatment. Port de-accessed per protocol. Pt discharged from clinic stable and ambulatory; accompanied by self.

## 2017-10-26 NOTE — Unmapped (Addendum)
Breast Cancer New Patient Evaluation:     Referring Physician: Cala Bradford, Md  [address]  [facility]  [place], [place] [address]. [phone]    PCP: Stacie Acres WHITE, MD    Assessment/Plan:  I spent at least 40 minutes with this patient more than 50% in counseling. The following issues were discussed:  A 62yo woman with a Left breast pT3pN2a, IDC, G2, ER+(95%)/PR+(30%)/HER2-neg, 5/6 LN positive, +LVI and positive surgical margins s/p BCT with targeted axillary LN bx on 07/03/17. Unfortunately her pathology revealed R1 resection and she requires re-excision with ALND. Completed this on 08/14/17 showing 7 mm of residual tumor, negative margins, 2/23 +LN with larger tumor deposit of 6 mm. Planning for ddAC-T but she has pre-exsisting neuropathy which makes use of weekly paclitaxel challenging so tried q 2 week taxol. However, she had a hypesensitivity reaction  into Taxol infusion and was able to complete at a slower rate.  Accordingly, switched to q3w Abraxane. She is postmenopausal so will plan for an aromatase inhibitor for her HR+ diease.     - Completed re-excision/ALND 08/14/17, now cleared by surgery   - Plan: ddTaxol x 4, followed by ddAC x4C -> Abraxance q3w C2-C4 d/t infusion rxn at C1, f/b ddAC x4C   - Labs and clinically appropriate for C3 with abraxane today.   No filgrastim given q3w regimen  - Antiestrogen therapy (an AI) for 5-49yrs  - CT-CAP 07/25/17 NED  - Echo 07/27/17 EF >55%  - Encouraged cryotherapy for G1 CIPN   - Port placed 09/28/17    RTC in 3 weeks for C4 taxane    Reason for Visit: A 62 y.o. female with breast cancer referred for consultation by Cala Bradford, Md  [address]  [facility]  [place], [place] [address] for recommendations concerning the management of breast cancer.  10/11/17  She initially presented with self-palpated left breast mass and workup/surgery revealed an 8.3cm tumor HR+/HER2-neg with positive LNs. She is recovering well from surgery.  Drains removed a week ago.She has some pain on the left side of her breast and pain plus numbness down inner side of her left arm. She reports that she has baseline neuropathy in her feet and is followed by a Neuroloogist in [place].  She does not have diabetes and states that the neuropathy is due to structural issues with her feet and high arches and runs in her family. She has no history of heart issues, never had an MI or heart failure. She recently moved to [facility] from [place].  She was laid off from her job and was working part-time but could not qualify for [employer] on the open market so she had to quit her part-time job.  States she would appreciate seeing a Artist to figure out how to pay for all this.    Interval History:  Here unaccompanied.   Infusion reaction with C1 paclitaxel (chest tightness,dyspnea,  n/v, urinary incontinence).    Responded to medications and tolerated remainder of infusion at a slower rate.    Still anti-coagulated on Eliquis for acute right-sided DVTs on doppler 10/11/17.  Had her left-sided seroma aspirated (48mL) per Surg/Onc 10/15/17 with relief.   Seroma not painful, but uncomfortable. Yesterday had PT done on cording and today its filling back up.   CIPN G1 feels more weakness/unsteady.   Still able to perform ADLs.  Has chronic persistent  neuropathy 2/2 chronic  hammer toes and high arches.   Cannot walk barefoot-neuropathy is located primarily on the balls of both feet.    Taking gabapentin 900mg  qhs, as needed will take 300mg  AM and 300mg  afternoon. Seroma almost resolved, no longer draining.    No f/c/n/v/d.  Performance status=0    Breast Cancer History:   Oncology History    2018: Left breast cT2 N1a, IDC, G1, +/+/-.        Malignant neoplasm of upper-outer quadrant of left breast in female, estrogen receptor positive (CMS-HCC)    06/07/2017 -  Presenting Symptoms     Left breast palpable UOQ mass         06/07/2017 Interval Scan(s)     Diagnostic [facility] at [facility]: left breast UOQ mass difficult to measure, but appears to measure at least 5.6 cm.    [place]: left breast ill defined shadowing lesion at 2:00, 6 CFN measuring 3.7 x 2.6 x 2.5 cm.     Single mildly abnormal node in left axilla with constriction of fatty hilum.         06/13/2017 Biopsy     [facility] core biopsy of left breast 2:00, 6 CFN: IDC, G1, ER+(95%), PR+(30%), HER2 negative by FISH.  [facility] core biopsy left axillary LN: +LN metastasis.         06/13/2017 -  Other     Seen in [facility] ED following core biopsy in [facility] for axillary pain with non-expanding 4 cm hematoma noted in left axilla. No intervention required other than symptomatic measures (compression, pain management).         06/20/2017 Tumor Board     [facility] recs: left breast cT2 N1a IDC, G1, receptors pending. Per outside imaging there was a single abnormal LN found by [place] only. Awaiting receptors. Need SAVI into that clipped node. Discussed that she otherwise fits ACOSOG Z0011 criteria with negative clinical exam prior to biopsy and single abnormal LN on axillary [place]. Reasonable to do SLNB and excise biopsied node as well. If receptors favorable, consider surgery first approach, good BCT candidate. If high risk (TN, HER+) may need NACT approach. Briefly discussed ALTERNATE, but consensus was that she will likely be recommended for chemo given age and nodal positivity. Meeting surgery, rad/onc today. Will circle back with med/onc as needed.         07/03/2017 Surgery     Left [facility] localized/US guided partial mastectomy and left sentinel lymph node biopsy with targeted axillary lymph node excision: Invasive mammary carcinoma with mixed ductal and lobular features present in association with in situ carcinoma with mixed ductal and lobular features measuring 8.3 cm with positive inferior and lateral margin. Sentinel lymph node biopsy: 1/1 node positive for metastatic disease measuring 7 mm. No ECE. Palpable axillary lymph nodes: 4/4 lymph nodes with metastatic disease measuring 10 mm.  No ECE.  Additional palpable axillary lymph node 0/1 with isolated tumor cells. SAVI scout in the axilla was defective and there was no localization signal from the SAVI probe.         08/14/2017 Surgery     Left re-excision, ALND w ARM: Lateral margin: Invasive carcinoma with mixed ductal and lobular features measuring 7mm w LVI, margin negative.   Inferior margin: Multiple foci of lymphovasular invasion present.  No evidence of stromal invasion (final margin negative)  Left ALND: ??Two of twenty-three lymph nodes positive for carcinoma (2/23).  Size of larger tumor deposit: 6 mm.  Extracapsular extension: Absent  09/21/2017 -  Chemotherapy     Started ddTaxol--->ddAC         09/21/2017 Adverse Reaction     First Taxol infused for ~20 minutes resulting in flushing with chest tightness and nausea.  Infusion was stopped and administered.  Symptoms subsided within 20 minutes but she continued to feel some chest tightness with deep inspiration.  Albuterol nebulizer was given.  She then returned to baseline and infusion was restarted at 1/2 rate and titrated up slowly.  She was able to complete infusion without any further events.          10/05/2017 -  Chemotherapy     Switching to Abraxane due to infusion reaction         10/11/2017 Adverse Reaction     Acute DVT Rt interval jugular, brachiocephalic, subclavian vein-now on Eliquis 5 mg qd.                 Past Medical History:  Patient Active Problem List   Diagnosis   ??? Malignant neoplasm of upper-outer quadrant of left breast in female, estrogen receptor positive (CMS-HCC)       Gyn History:   LMP 2012    Surgical History:   Past Surgical History:   Procedure Laterality Date   ??? ANAL FISSURECTOMY     ??? BREAST BIOPSY Left 06/2017    malignant   ??? BREAST BIOPSY Left 06/2017    Lympnode bx-positive   ??? IR INSERT PORT AGE GREATER THAN 5 YRS  09/28/2017    IR INSERT PORT AGE GREATER THAN 5 YRS 09/28/2017 Rush Barer, MD IMG [facility]   ??? PR BX/REMV,LYMPH NODE,DEEP AXILL Left 07/03/2017    Procedure: BX/EXC LYMPH NODE; OPEN, DEEP AXILRY NODE;  Surgeon: Talbert Cage, DO;  Location: ASC OR [facility];  Service: Surgical Oncology   ??? PR INTRAOPERATIVE SENTINEL LYMPH NODE ID W DYE INJECTION Left 07/03/2017    Procedure: INTRAOPERATIVE IDENTIFICATION SENTINEL LYMPH NODE(S) INCLUDE INJECTION NON-RADIOACTIVE DYE, WHEN PERFORMED;  Surgeon: Talbert Cage, DO;  Location: ASC [facility];  Service: Surgical Oncology   ??? PR INTRAOPERATIVE SENTINEL LYMPH NODE ID W DYE INJECTION Left 08/14/2017    Procedure: INTRAOPERATIVE IDENTIFICATION SENTINEL LYMPH NODE(S) INCLUDE INJECTION NON-RADIOACTIVE DYE, WHEN PERFORMED;  Surgeon: Talbert Cage, DO;  Location: ASC OR [facility];  Service: Surgical Oncology Breast   ??? PR MASTECTOMY, PARTIAL Left 07/03/2017    Procedure: MASTECTOMY, savi scout PARTIAL (EG, LUMPECTOMY, TYLECTOMY, QUADRANTECTOMY, SEGMENTECTOMY);  Surgeon: Talbert Cage, DO;  Location: ASC OR [facility];  Service: Surgical Oncology   ??? PR MASTECTOMY, PARTIAL Left 08/14/2017    Procedure: MASTECTOMY, PARTIAL (EG, LUMPECTOMY, TYLECTOMY, QUADRANTECTOMY, SEGMENTECTOMY) Re-excision;  Surgeon: Talbert Cage, DO;  Location: ASC OR [facility];  Service: Surgical Oncology Breast   ??? PR REMOVE ARMPITS LYMPH NODES COMPLT Left 08/14/2017    Procedure: AXILLARY LYMPHADENECTOMY; COMPLETE;  Surgeon: Talbert Cage, DO;  Location: ASC OR [facility];  Service: Surgical Oncology Breast       Medications:  Current Outpatient Prescriptions   Medication Sig Dispense Refill   ??? acetaminophen (TYLENOL) 500 MG tablet Take 1,000 mg by mouth every four (4) hours as needed for pain.     ??? apixaban (ELIQUIS) 5 mg (74 tabs) DsPk The first 7 days of treatment, take 10mg  (2 tablets) by mouth twice a day after first week take 5mg  (1 tablet) twice a day. 74 tablet 0   ??? buPROPion (WELLBUTRIN XL) 300 MG 24 hr tablet  Take 300 mg by mouth daily.     ??? docusate sodium (COLACE) 100 MG capsule Take 100 mg by mouth daily with breakfast.     ??? ELIQUIS 5 mg Tab   0   ??? FLUoxetine (PROZAC) 10 MG tablet Take 10 mg by mouth daily.     ??? GABAPENTIN ORAL Take 900 mg by mouth nightly. Pt taking 1 capsule at noon and 3 capsules at 8PM     ??? loratadine (CLARITIN ORAL) Take by mouth.     ??? ondansetron (ZOFRAN) 8 MG tablet Take 1 tablet (8 mg total) by mouth every twelve (12) hours as needed for nausea. (Patient not taking: Reported on 10/05/2017) 30 tablet 2   ??? ranitidine (ZANTAC) 150 MG tablet Take 150 mg by mouth daily.        No current facility-administered medications for this visit.        Allergies:  Allergies   Allergen Reactions   ??? Erythromycin Diarrhea   ??? Penicillins Rash       Personal and Social History:  Social History     Social History   ??? Marital status: Married     Spouse name: N/A   ??? Number of children: N/A   ??? Years of education: N/A     Occupational History   ??? Not on file.     Social History Main Topics   ??? Smoking status: Never Smoker   ??? Smokeless tobacco: Never Used   ??? Alcohol use No   ??? Drug use: No   ??? Sexual activity: Not on file     Other Topics Concern   ??? Not on file     Social History Narrative    The patient is married. She lives at home with her husband, Ed.  She has two children       Family History:  Cancer-related family history includes Breast cancer in her paternal aunt. There is no history of Cancer or Ovarian cancer.  indicated that the status of her mother is unknown. She indicated that the status of her father is unknown. She indicated that the status of her sister is unknown. She indicated that the status of her maternal grandmother is unknown. She indicated that the status of her maternal grandfather is unknown. She indicated that the status of her paternal grandmother is unknown. She indicated that the status of her paternal grandfather is unknown. She indicated that the status of her daughter is unknown. She indicated that the status of her neg hx is unknown.       Review of Systems: A complete review of systems was obtained including: Constitutional, Eyes, ENT, Cardiovascular, Respiratory, GI, GU, Musculoskeletal, Skin, Neurological, Psychiatric, Endocrine, Heme/Lymphatic, and Allergic/Immunologic systems. It is negative or non-contributory to the patient???s management except for the following: None    Physical Examination:  Vital Signs: BP 122/62  - Pulse 89  - Temp 36.9 ??C (98.4 ??F)  - Resp 18  - Ht 171 cm (5' 7.32)  - Wt 91.6 kg (202 lb)  - SpO2 94%  - BMI 31.33 kg/m??   General:  Healthy-appearing female in no acute distress..  Cardiovascular:  Heart not clinically enlarged.  No significant murmurs or gallops.  No lower extremity edema.    Respiratory:  Chest clear to percussion and auscultation.   Gastrointestinal:  Abdomen soft without masses and tenderness, no hepatosplenomegaly or other masses.   Musculoskeletal:  No bony pain or tenderness.   Skin and Subcutaneous Tissues:  Negative. No rash, ecchymoses, purpuric lesions noted.  Breasts:  Left breast with well healing scar.  Mild swelling lateral left breast+seroma. Resolving erythema, no fluctuance. Left axilla tender and healing scar. R breast normal appearing.  Psychiatric:  Alert and oriented.  Mood is normal.  No other symptoms.   Heme/Lymphatic/Immunologic:  No cervical or axillary adenopathy.   Upper Extremity Lymphedema: None.

## 2017-10-26 NOTE — Unmapped (Signed)
Labs drawn and sent for analysis.  Care provided by  D Maxwell, RN

## 2017-10-26 NOTE — Unmapped (Addendum)
It was a pleasure seeing you today.   Continue with the current plan of care.  Follow-up in 3 weeks.    To try and prevent neuropathy in your hands and feet:  - To lower chances of nerve damage in fingers and toes you can wear cold gloves and socks on your hands and feet on days you are receiving paclitaxel (Taxol). Can also use ice in Ziploc bags and other products like CryoMax.   - Wear for 15 minutes prior to paclitaxel infusion, during treatment, and for 15 minutes after completion of infusion.  - From a patient who used CryoMax: I have been using small ice packs that fold over my toes and fingers during infusions. The ice packs are called CryoMax. They stay frozen for eight hours with no dripping, sweating, or refreezing. I bought them through [employer]. The size I bought were 6 inch x 6 inch squares. There are 2 ice packs per box, so I bought 2 boxes (total of 4 ice packs) to cover both feet and both hands. The total cost was about $20. The infusion nurses loved them because they didn't have to replace any ice or clean any wet areas. In fact, the infusion center is considering buying them for all their patients so the nurses don't have to deal with hauling ice and mopping up when ice melts. You will have to specify the CryoMax brand when you search on [employer]. Hope this helps. Oh, just wear a pair of regular socks while icing.    No orders of the defined types were placed in this encounter.    Labs from today if done:  Lab on 10/26/2017   Component Date Value   ??? Albumin 10/26/2017 4.3    ??? Total Protein 10/26/2017 6.5    ??? Total Bilirubin 10/26/2017 0.5    ??? Bilirubin, Direct 10/26/2017 0.20    ??? AST 10/26/2017 31    ??? ALT 10/26/2017 42    ??? Alkaline Phosphatase 10/26/2017 65    ??? WBC 10/26/2017 8.8    ??? RBC 10/26/2017 4.77    ??? HGB 10/26/2017 13.6    ??? HCT 10/26/2017 40.1    ??? MCV 10/26/2017 84.2    ??? [facility] 10/26/2017 28.5    ??? MCHC 10/26/2017 33.9    ??? RDW 10/26/2017 14.5    ??? MPV 10/26/2017 7.5    ??? Platelet 10/26/2017 282    ??? Neutrophils % 10/26/2017 61.8    ??? Lymphocytes % 10/26/2017 28.4    ??? Monocytes % 10/26/2017 6.6    ??? Eosinophils % 10/26/2017 0.7    ??? Basophils % 10/26/2017 1.0    ??? Absolute Neutrophils 10/26/2017 5.4    ??? Absolute Lymphocytes 10/26/2017 2.5    ??? Absolute Monocytes 10/26/2017 0.6    ??? Absolute Eosinophils 10/26/2017 0.1    ??? Absolute Basophils 10/26/2017 0.1    ??? Large Unstained Cells 10/26/2017 2         Future Appointments:  Future Appointments  Date Time Provider Department Center   10/26/2017 8:30 AM Marcy Panning, FNP [facility]   10/26/2017 10:00 AM ONCINF CHAIR 47 [id number]   11/05/2017 8:30 AM Kristalyn Kemper Durie, DO [facility]   11/16/2017 8:30 AM ADULT ONC LAB [facility]   11/16/2017 9:30 AM Marcy Panning, FNP [id number]   11/16/2017 10:30 AM ONCINF CHAIR 39 [id number]   12/04/2017 9:30 AM Sherin Shelah Lewandowsky, PT [facility]   12/06/2017 9:30 AM Sherin Shelah Lewandowsky, PT [facility]   12/11/2017 10:30 AM Sherin Shelah Lewandowsky, PT [facility]   12/13/2017 10:30 AM Sherin Shelah Lewandowsky, PT [facility]   12/20/2017 10:30 AM Sherin Shelah Lewandowsky, PT [facility]   12/25/2017 10:30 AM Sherin Shelah Lewandowsky, PT [facility]   01/03/2018 10:30 AM Sherin Shelah Lewandowsky, PT [facility]   01/10/2018 10:30 AM Sherin Shelah Lewandowsky, PT [facility]   01/17/2018 10:30 AM Sherin Shelah Lewandowsky, PT [facility]   01/31/2018 10:30 AM Sherin Shelah Lewandowsky, PT [facility]       For appointments call [phone]  For new symptoms or health related issues, please call   Nurse Navigator: [name], RN [phone]  On weekends and after hours on weekdays, please call [phone] for the oncology fellow on call.      If you use my[facility]chart, please know that I check messages only twice a week. If your message is urgent, please call or page your nurse navigator or the on-call fellow.

## 2017-10-26 NOTE — Unmapped (Signed)
If you feel like this is an emergency please call 911.  For appointments or questions Monday through Friday 8AM-5PM please call [phone] or Toll Free [phone]. For Medical questions or concerns ask for the Nurse Triage Line.  On Nights, Weekends, and Holidays call [phone] and ask for the Oncologist on Call.  Reasons to call the Nurse Triage Line:  Fever of 100.5 or greater  Nausea and/or vomiting not relived with nausea medicine  Diarrhea or constipation  Severe pain not relieved with usual pain regimen  Shortness of breath  Uncontrolled bleeding  Mental status changes

## 2017-10-27 NOTE — Unmapped (Signed)
Encounter addended by: Annell Greening, RN on: 10/26/2017  8:15 PM<BR>    Actions taken: Check Out activity completed

## 2017-10-30 ENCOUNTER — Encounter
Admit: 2017-10-30 | Discharge: 2017-10-31 | Payer: PRIVATE HEALTH INSURANCE | Attending: Adult Health | Primary: Adult Health

## 2017-10-30 DIAGNOSIS — Z170 Estrogen receptor positive status [ER+]: Secondary | ICD-10-CM

## 2017-10-30 DIAGNOSIS — C50412 Malignant neoplasm of upper-outer quadrant of left female breast: Principal | ICD-10-CM

## 2017-10-31 NOTE — Unmapped (Signed)
Patient Name: Erika Cabrera  Patient Age: 62 y.o.  Encounter Date: 10/30/2017    Referring Physician:   Referred Self  No address on file    Primary Care Provider:  Cala Bradford, MD    Breast Cancer Consulting Physicians:  Surgical Oncology: Sherilyn Cooter, DO   Surgical Oncology Nurse Practitioner: Pollyann Samples, NP  Radiation Oncology: Lorrin Mais, MD  Medical Oncology: Marc Morgans, MD      Reason for Visit: Post-operative visit    Diagnosis:   1. Left breast infiltrating ductal carcinoma  Cancer Staging  Malignant neoplasm of upper-outer quadrant of left breast in female, estrogen receptor positive (CMS-HCC)  Staging form: Breast, AJCC 8th Edition  - Clinical stage from 06/20/2017: Stage IIA (cT2, cN1(f), cM0, G1, ER: Positive, PR: Positive, HER2: Negative) - Signed by Talbert Cage, DO on 06/26/2017  - Pathologic stage from 07/13/2017: Stage IB (pT3, pN2a(sn), cM0, G2, ER: Positive, PR: Positive, HER2: Negative) - Signed by Rudie Meyer, ANP on 07/19/2017      Procedure:   1. Left [facility] localized, US guided partial mastectomy and left sentinel lymph node biopsy with targeted axillary lymph node excision, performed on 07/03/2017.  2. Left re-excision partial mastectomy and axillary lymph node dissection with axillary reverse mapping, performed on 08/14/2017.     Clinical Trial Participant:   1. No clinical trials at this time.    Interim History:  ErikaErika Cabrera returns to the office after the above noted procedure for her postoperative visit. She reports she is recovering well. Erika Cabrera denies fevers, chills, bruising, redness and wound drainage postoperatively. Erika Cabrera had a seroma catheter placed on 02/04, which was removed on 02/11.  She has had several bedside aspirations since the seroma catheter was removed.  She saw Dr. Dellis Anes on 03/18 and was referred to mammography for U/S guided aspiration at which time 48 ml of fluid was aspirated.  She then started PT.  She notes that since PT, the fluid has reaccumulated, which she finds quite bothersome.     On exam today, she does have mild fluctuance in the axilla. She complains of significant fatigue from her chemotherapy.      Historically, Erika Cabrera presented with a self palpated left breast mass.     Pathology Review:  Final pathology revealed invasive mammary carcinoma with mixed ductal and lobular features present in association with in situ carcinoma with mixed ductal and lobular features, Nottingham grade 2, estrogen receptor 95% positive, progesterone receptor 30% positive, H2N FISH non-amplified measuring 8.3 cm in greatest dimension, final margins show invasive carcinoma present at the green inked lateral and yellow inked inferior margin(s). 5 of 6 sentinel lymph nodes demonstrated metastatic carcinoma, lymphovascular invasion was present, extracapsular extension was  absent and the largest focus of metastasis measured 10 mm.     On re-excision and ALND, pathology revealed: Lateral margin: Invasive carcinoma with mixed ductal and lobular features measuring 7mm w LVI, margin negative. Inferior margin: Multiple foci of lymphovasular invasion present.  No evidence of stromal invasion (final margin negative).  Left ALND: ??Two of twenty-three lymph nodes positive for carcinoma (2/23).  Size of larger tumor deposit: 6 mm.  Extracapsular extension: Absent  This was reviewed with Erika Cabrera. We discussed the significance of this finding at great length. She states her understanding of this pathology report.  A copy of the pathology report was provided to [facility].     Oncology History    2018: Left breast cT2 N1a, IDC, G1, +/+/-.  Malignant neoplasm of upper-outer quadrant of left breast in female, estrogen receptor positive (CMS-HCC)    06/07/2017 -  Presenting Symptoms     Left breast palpable UOQ mass         06/07/2017 Interval Scan(s)     Diagnostic MMG at [facility]: left breast UOQ mass difficult to measure, but appears to measure at least 5.6 cm.    [place]: left breast ill defined shadowing lesion at 2:00, 6 CFN measuring 3.7 x 2.6 x 2.5 cm.     Single mildly abnormal node in left axilla with constriction of fatty hilum.         06/13/2017 Biopsy     [facility] core biopsy of left breast 2:00, 6 CFN: IDC, G1, ER+(95%), PR+(30%), HER2 negative by FISH.  [facility] core biopsy left axillary LN: +LN metastasis.         06/13/2017 -  Other     Seen in [facility] ED following core biopsy in [facility] for axillary pain with non-expanding 4 cm hematoma noted in left axilla. No intervention required other than symptomatic measures (compression, pain management).         06/20/2017 Tumor Board     [facility] recs: left breast cT2 N1a IDC, G1, receptors pending. Per outside imaging there was a single abnormal LN found by [place] only. Awaiting receptors. Need SAVI into that clipped node. Discussed that she otherwise fits ACOSOG Z0011 criteria with negative clinical exam prior to biopsy and single abnormal LN on axillary [place]. Reasonable to do SLNB and excise biopsied node as well. If receptors favorable, consider surgery first approach, good BCT candidate. If high risk (TN, HER+) may need NACT approach. Briefly discussed ALTERNATE, but consensus was that she will likely be recommended for chemo given age and nodal positivity. Meeting surgery, rad/onc today. Will circle back with med/onc as needed.         07/03/2017 Surgery     Left [facility] localized/US guided partial mastectomy and left sentinel lymph node biopsy with targeted axillary lymph node excision: Invasive mammary carcinoma with mixed ductal and lobular features present in association with in situ carcinoma with mixed ductal and lobular features measuring 8.3 cm with positive inferior and lateral margin. Sentinel lymph node biopsy: 1/1 node positive for metastatic disease measuring 7 mm. No ECE. Palpable axillary lymph nodes: 4/4 lymph nodes with metastatic disease measuring 10 mm.  No ECE.  Additional palpable axillary lymph node 0/1 with isolated tumor cells. SAVI scout in the axilla was defective and there was no localization signal from the SAVI probe.         08/14/2017 Surgery     Left re-excision, ALND w ARM: Lateral margin: Invasive carcinoma with mixed ductal and lobular features measuring 7mm w LVI, margin negative.   Inferior margin: Multiple foci of lymphovasular invasion present.  No evidence of stromal invasion (final margin negative)  Left ALND: ??Two of twenty-three lymph nodes positive for carcinoma (2/23).  Size of larger tumor deposit: 6 mm.  Extracapsular extension: Absent         09/21/2017 -  Chemotherapy     Started ddTaxol--->ddAC         09/21/2017 Adverse Reaction     First Taxol infused for ~20 minutes resulting in flushing with chest tightness and nausea.  Infusion was stopped and administered.  Symptoms subsided within 20 minutes but she continued to feel some chest tightness with deep inspiration.  Albuterol nebulizer was given.  She then returned to  baseline and infusion was restarted at 1/2 rate and titrated up slowly.  She was able to complete infusion without any further events.          10/05/2017 -  Chemotherapy     Switching to Abraxane due to infusion reaction         10/11/2017 Adverse Reaction     Acute DVT Rt interval jugular, brachiocephalic, subclavian vein-now on Eliquis 5 mg qd.             Physical Exam:  Blood pressure 114/66, pulse 97, temperature 36.9 ??C (98.4 ??F), temperature source Oral, resp. rate 20, weight 93.4 kg (206 lb), SpO2 97 %.  General Appearance: No acute distress, well appearing and well nourished.   Breast: The breasts are symmetrical. The left periareolar incision and left axillary incision is clean, dry, intact and healing well.  35 ml of serous fluid was aspirated from the axillary incision and discarded.          Assessment:  Erika Cabrera is a 62 y.o. female with a left breast infiltrating ductal carcinoma, estrogen receptor positive, progesterone receptor positive and HER2 negative, who is recovering well after left [facility] localized, US guided partial mastectomy and left sentinel lymph node biopsy with targeted axillary lymph node excision.  Cancer Staging  Malignant neoplasm of upper-outer quadrant of left breast in female, estrogen receptor positive (CMS-HCC)  Staging form: Breast, AJCC 8th Edition  - Clinical stage from 06/20/2017: Stage IIA (cT2, cN1(f), cM0, G1, ER: Positive, PR: Positive, HER2: Negative) - Signed by Talbert Cage, DO on 06/26/2017  - Pathologic stage from 07/13/2017: Stage IB (pT3, pN2a(sn), cM0, G2, ER: Positive, PR: Positive, HER2: Negative) - Signed by Rudie Meyer, ANP on 07/19/2017      Plan:  Continue PT.  Discussed the importance of reestablishing flow.  Discussed that repeated aspiration can increase her risk for infection and delay wound healing.  Discussed that we would not recommend repeat aspiration after today.   Cancel Monday's appointment as she was seen today.   Discussed the importance of physical activity to help with her fatigue.  Otherwise, she was encouraged to discuss with med/onc.   Otherwise will follow-up in Nov with mammogram    At the conclusion of our visit all of Erika Cabrera's questions and concerns were addressed and arrangements were made for follow up US discussed above. She was instructed call us with any further questions, concerns or issues.    Cancer Staging  Malignant neoplasm of upper-outer quadrant of left female breast (CMS-HCC)  Staging form: Breast, AJCC 8th Edition  - Clinical stage from 06/20/2017: Stage IIA (cT2, cN1(f), cM0, G1, ER: Positive, PR: Positive, HER2: Negative) - Signed by Talbert Cage, DO on 06/26/2017  - Pathologic stage from 07/13/2017: Stage IB (pT3, pN2a(sn), cM0, G2, ER: Positive, PR: Positive, HER2: Negative) - Signed by Rudie Meyer, ANP on 07/19/2017      Pollyann Samples, NP-C  Surgical Breast Oncology  10/31/2017 3:10 PM

## 2017-11-06 MED ORDER — APIXABAN 5 MG TABLET
ORAL_TABLET | Freq: Two times a day (BID) | ORAL | 5 refills | 0 days | Status: CP
Start: 2017-11-06 — End: 2018-06-24

## 2017-11-06 NOTE — Unmapped (Signed)
Hi,    Patient Erika Cabrera called requesting a medication refill for the following:    ? Medication: Eliquis  ? Dosage: 5MG   ? Days left of medication: 0    Patient???s pharmacy has been updated/verified in the system.      Thank you,  Vernie Ammons  [facility] Cancer Communication Center  [phone]

## 2017-11-12 NOTE — Unmapped (Signed)
Patient contacted the Communication Center in regards to some garments she needs from [facility].  She needs to speak to the care team about getting the correct approval faxed to them.    She can be reached at [phone].    Thanks in advance,  Vernie Ammons  [facility] Cancer Communication Center  [phone]

## 2017-11-12 NOTE — Unmapped (Signed)
Per request of pt, compression sleeve script sent by [facility], singed and returned to [facility].

## 2017-11-12 NOTE — Unmapped (Signed)
[facility] Triage Note     Patient: Erika Cabrera     Reason for call:  Patient contacted the Communication Center in regards to some garments she needs from [facility]. ??She needs to speak to the care team about getting the correct approval faxed to them.  ??  She can be reached at [phone].    Time call returned: 1030     Phone Assessment: Spoke w/pt:  1. Related [place] Moos message, read below.  2. Pt has an appt on Friday, April 12,2019 for an infusion. Pt reports mild redness, slight tenderness to touch and mild inflammation to port site.  3. Pt is cleaning port w/ water, at times w/ Peroxide and applying triple antibiotics.     Triage Recommendations: Spoke w/ pt, per Lennox Pippins recommendations:   Please let her know it is done. ??I sent her a msg this morning, and documented a note that the order and clinicnotes were faxed back to christina knopp.     Related to pt, to keep doing what she is doing and on Friday we can evaluate port site.  Patient Response: thanks     Outstanding tasks: Care team notification no further actions needed      Patient Pharmacy has been verified and primary pharmacy has been marked as preferred

## 2017-11-12 NOTE — Unmapped (Signed)
[facility] Triage Note     Patient: Erika Cabrera     Reason for call:  Patient contacted the Communication Center in regards to some garments she needs from [facility].  She needs to speak to the care team about getting the correct approval faxed to them.  ??  She can be reached at [phone].    Time call returned: 0957     Phone Assessment: Spoke w/ pt:  1. Pt needs documentation for her compression sleeve and bra.  2. Please fax to Christine at [phone], phone number [phone] ext [phone]     Triage Recommendations: Spoke w/ pt, fax sent.     Patient Response: thanks     Outstanding tasks: Care team notification no further actions needed      Patient Pharmacy has been verified and primary pharmacy has been marked as preferred

## 2017-11-16 ENCOUNTER — Encounter: Admit: 2017-11-16 | Discharge: 2017-11-16 | Payer: PRIVATE HEALTH INSURANCE | Attending: Family | Primary: Family

## 2017-11-16 ENCOUNTER — Ambulatory Visit: Admit: 2017-11-16 | Discharge: 2017-11-16 | Payer: PRIVATE HEALTH INSURANCE

## 2017-11-16 ENCOUNTER — Encounter: Admit: 2017-11-16 | Discharge: 2017-11-16 | Payer: PRIVATE HEALTH INSURANCE

## 2017-11-16 DIAGNOSIS — C50412 Malignant neoplasm of upper-outer quadrant of left female breast: Principal | ICD-10-CM

## 2017-11-16 DIAGNOSIS — Z170 Estrogen receptor positive status [ER+]: Secondary | ICD-10-CM

## 2017-11-16 LAB — CBC W/ AUTO DIFF
BASOPHILS ABSOLUTE COUNT: 0.1 10*9/L (ref 0.0–0.1)
BASOPHILS RELATIVE PERCENT: 0.7 %
EOSINOPHILS ABSOLUTE COUNT: 0.1 10*9/L (ref 0.0–0.4)
EOSINOPHILS RELATIVE PERCENT: 0.7 %
HEMATOCRIT: 42.2 % (ref 36.0–46.0)
HEMOGLOBIN: 14.3 g/dL (ref 12.0–16.0)
LARGE UNSTAINED CELLS: 2 % (ref 0–4)
LYMPHOCYTES ABSOLUTE COUNT: 2.5 10*9/L (ref 1.5–5.0)
LYMPHOCYTES RELATIVE PERCENT: 32.3 %
MEAN CORPUSCULAR HEMOGLOBIN CONC: 34 g/dL (ref 31.0–37.0)
MEAN CORPUSCULAR HEMOGLOBIN: 28.7 pg (ref 26.0–34.0)
MEAN CORPUSCULAR VOLUME: 84.6 fL (ref 80.0–100.0)
MEAN PLATELET VOLUME: 7.8 fL (ref 7.0–10.0)
MONOCYTES ABSOLUTE COUNT: 0.4 10*9/L (ref 0.2–0.8)
NEUTROPHILS ABSOLUTE COUNT: 4.6 10*9/L (ref 2.0–7.5)
NEUTROPHILS RELATIVE PERCENT: 59.5 %
PLATELET COUNT: 272 10*9/L (ref 150–440)
RED BLOOD CELL COUNT: 4.99 10*12/L (ref 4.00–5.20)
RED CELL DISTRIBUTION WIDTH: 15.1 % — ABNORMAL HIGH (ref 12.0–15.0)
WBC ADJUSTED: 7.8 10*9/L (ref 4.5–11.0)

## 2017-11-16 LAB — HEPATIC FUNCTION PANEL
ALBUMIN: 4.6 g/dL (ref 3.5–5.0)
ALT (SGPT): 48 U/L (ref 15–48)
BILIRUBIN DIRECT: 0.2 mg/dL (ref 0.00–0.40)
BILIRUBIN TOTAL: 0.6 mg/dL (ref 0.0–1.2)

## 2017-11-16 LAB — EOSINOPHILS ABSOLUTE COUNT: Lab: 0.1

## 2017-11-16 LAB — BILIRUBIN DIRECT: Bilirubin.glucuronidated:MCnc:Pt:Ser/Plas:Qn:: 0.2

## 2017-11-16 NOTE — Unmapped (Addendum)
It was a pleasure seeing you today.   Continue with the current plan of care.  Follow-up in 3 weeks.    No orders of the defined types were placed in this encounter.      Labs from today if done:  Lab on 10/26/2017   Component Date Value   ??? Albumin 10/26/2017 4.3    ??? Total Protein 10/26/2017 6.5    ??? Total Bilirubin 10/26/2017 0.5    ??? Bilirubin, Direct 10/26/2017 0.20    ??? AST 10/26/2017 31    ??? ALT 10/26/2017 42    ??? Alkaline Phosphatase 10/26/2017 65    ??? WBC 10/26/2017 8.8    ??? RBC 10/26/2017 4.77    ??? HGB 10/26/2017 13.6    ??? HCT 10/26/2017 40.1    ??? MCV 10/26/2017 84.2    ??? [facility] 10/26/2017 28.5    ??? MCHC 10/26/2017 33.9    ??? RDW 10/26/2017 14.5    ??? MPV 10/26/2017 7.5    ??? Platelet 10/26/2017 282    ??? Neutrophils % 10/26/2017 61.8    ??? Lymphocytes % 10/26/2017 28.4    ??? Monocytes % 10/26/2017 6.6    ??? Eosinophils % 10/26/2017 0.7    ??? Basophils % 10/26/2017 1.0    ??? Absolute Neutrophils 10/26/2017 5.4    ??? Absolute Lymphocytes 10/26/2017 2.5    ??? Absolute Monocytes 10/26/2017 0.6    ??? Absolute Eosinophils 10/26/2017 0.1    ??? Absolute Basophils 10/26/2017 0.1    ??? Large Unstained Cells 10/26/2017 2         Future Appointments:  Future Appointments   Date Time Provider Department Center   11/16/2017  9:30 AM Marcy Panning, FNP [facility]   11/16/2017 10:30 AM ONCINF CHAIR 39 [id number]   12/04/2017  9:30 AM Sherin Shelah Lewandowsky, PT [facility]   12/06/2017  9:30 AM Sherin Shelah Lewandowsky, PT [facility]   12/07/2017  8:00 AM ADULT ONC LAB [facility]   12/07/2017  9:00 AM Marcy Panning, FNP [facility]   12/07/2017 10:00 AM ONCINF CHAIR 47 [id number]   12/11/2017 10:30 AM Sherin Shelah Lewandowsky, PT [facility]   12/13/2017 10:30 AM Sherin Shelah Lewandowsky, PT [facility]   12/20/2017 10:30 AM Sherin Shelah Lewandowsky, PT [facility]   12/21/2017  1:00 PM Willeen Niece, MD [facility] 12/24/2017 10:30 AM Sherin Shelah Lewandowsky, PT [facility]   01/03/2018 10:30 AM Sherin Shelah Lewandowsky, PT [facility]   01/04/2018  9:00 AM ADULT ONC LAB [facility]   01/04/2018 10:00 AM Willeen Niece, MD [facility]   01/04/2018 11:00 AM ONCINF CHAIR 31 [facility]   01/10/2018 10:30 AM Sherin Shelah Lewandowsky, PT [facility]   01/17/2018 10:30 AM Sherin Shelah Lewandowsky, PT [facility]   01/18/2018  8:00 AM ADULT ONC LAB [facility]   01/18/2018  9:00 AM Willeen Niece, MD [facility]   01/18/2018 10:00 AM ONCINF CHAIR 15 [id number]   01/31/2018 10:30 AM Sherin Shelah Lewandowsky, PT [facility] [place]       For appointments call [phone]  For new symptoms or health related issues, please call   Nurse Navigator: [name], RN [phone]  On weekends and after hours on weekdays, please call [phone] for the oncology fellow on call.      If  you use my[facility]chart, please know that I check messages only twice a week. If your message is urgent, please call or page your nurse navigator or the on-call fellow.

## 2017-11-16 NOTE — Unmapped (Signed)
Labs found to be within parameters for treatment today. Request for drug sent to pharmacy.

## 2017-11-16 NOTE — Unmapped (Signed)
Breast Cancer Return Patient Evaluation:     Referring Physician: Cala Bradford, Erika Cabrera  [address]  [facility]  [place], [place] [address]. [phone]    PCP: Stacie Acres WHITE, Erika Cabrera    Assessment/Plan:  I spent at least 40 minutes with this patient more than 50% in counseling. The following issues were discussed:  A 62yo woman with a Left breast pT3pN2a, IDC, G2, ER+(95%)/PR+(30%)/HER2-neg, 5/6 LN positive, +LVI and positive surgical margins s/p BCT with targeted axillary LN bx on 07/03/17. Unfortunately her pathology revealed R1 resection and she requires re-excision with ALND. Completed this on 08/14/17 showing 7 mm of residual tumor, negative margins, 2/23 +LN with larger tumor deposit of 6 mm. Planning for ddAC-T but she has pre-exsisting neuropathy which makes use of weekly paclitaxel challenging so tried q 2 week taxol. However, she had a hypesensitivity reaction  into Taxol infusion and was able to complete at a slower rate.  Accordingly, switched to q3w Abraxane. She is postmenopausal so will plan for an aromatase inhibitor for her HR+ diease. She completed her re-excision/ALND on 08/14/17, now cleared by surgery     I spent at least 40 minutes with this patient more than 50% in counseling. The following issues were discussed:     - Plan: ddTaxol x 4, followed by ddAC x4C   ---> Switching to Abraxance q3w C2-C4   2/2 infusion rxn at C1, f/b ddAC x4   - Labs and clinically appropriate for C4 with abraxane today.   No filgrastim given q3w regimen  - Antiestrogen therapy (an AI) for 5-109yrs  - CT-CAP 07/25/17 NED  - Echo 07/27/17 EF >55%  - Encouraged cryotherapy for G2 CIPN and con't w/Gaba   - Bone pain with Neulasta with 1st Cycle of ddTaxol    Will attempt Neulasta again with pharmacy guidance    On how to manage bone pain. Will use Claritin 3 days     Prior to Neulasta with otc Tylenol/Oxycodone prn    RTC in 3 weeks for C1 ddAC with Neulasta    Reason for Visit: A 62 y.o. female with breast cancer   Receiving adjuvant chemotherapy for her pT3pN2a HR+ Her2 negative left sided breast cancer.    10/11/17 She initially presented with self-palpated left breast mass and workup/surgery revealed an 8.3cm tumor HR+/HER2-neg with positive LNs. She is recovering well from surgery.  Drains removed a week ago.She has some pain on the left side of her breast and pain plus numbness down inner side of her left arm. She reports that she has baseline neuropathy in her feet and is followed by a Neuroloogist in [place].  She does not have diabetes and states that the neuropathy is due to structural issues with her feet and high arches and runs in her family. She has no history of heart issues, never had an MI or heart failure. She recently moved to [facility] from [place].  She was laid off from her job and was working part-time but could not qualify for [employer] on the open market so she had to quit her part-time job.  States she would appreciate seeing a Artist to figure out how to pay for all this.    Interval History:  Here with husband.  Ready for C4 of Taxane (Abraxane 2/2 hypersensitivity reaction).  Still tolerating Eliquis for acute right-sided DVTs on doppler 10/11/17.  Seroma not painful, but uncomfortable.   CIPN G2 feels more weakness/unsteady.  Neuropathy is worse in the balls of the feet.  Still able to perform ADLs.  Has chronic persistent neuropathy 2/2 chronic hammer toes and high arches.   Cannot walk barefoot-neuropathy is located primarily on the balls of both feet.    Neuropathy is better, but has needed to increase her  gabapentin 900mg  qhs, 600mg  AM and 300mg  in the afternoon.   No f/c/n/v/d.  Skin over her legs, arms is itchy without rash.  Lymphedema is stable with compression garments.  Performance status=1    Breast Cancer History:   Oncology History    2018: Left breast cT2 N1a, IDC, G1, +/+/-.        Malignant neoplasm of upper-outer quadrant of left breast in female, estrogen receptor positive (CMS-HCC)    06/07/2017 -  Presenting Symptoms     Left breast palpable UOQ mass         06/07/2017 Interval Scan(s)     Diagnostic MMG at [facility]: left breast UOQ mass difficult to measure, but appears to measure at least 5.6 cm.    [place]: left breast ill defined shadowing lesion at 2:00, 6 CFN measuring 3.7 x 2.6 x 2.5 cm.     Single mildly abnormal node in left axilla with constriction of fatty hilum.         06/13/2017 Biopsy     [facility] core biopsy of left breast 2:00, 6 CFN: IDC, G1, ER+(95%), PR+(30%), HER2 negative by FISH.  [facility] core biopsy left axillary LN: +LN metastasis.         06/13/2017 -  Other     Seen in [facility] ED following core biopsy in [facility] for axillary pain with non-expanding 4 cm hematoma noted in left axilla. No intervention required other than symptomatic measures (compression, pain management).         06/20/2017 Tumor Board     [facility] recs: left breast cT2 N1a IDC, G1, receptors pending. Per outside imaging there was a single abnormal LN found by [place] only. Awaiting receptors. Need SAVI into that clipped node. Discussed that she otherwise fits ACOSOG Z0011 criteria with negative clinical exam prior to biopsy and single abnormal LN on axillary [place]. Reasonable to do SLNB and excise biopsied node as well. If receptors favorable, consider surgery first approach, good BCT candidate. If high risk (TN, HER+) may need NACT approach. Briefly discussed ALTERNATE, but consensus was that she will likely be recommended for chemo given age and nodal positivity. Meeting surgery, rad/onc today. Will circle back with med/onc as needed.         07/03/2017 Surgery     Left [facility] localized/US guided partial mastectomy and left sentinel lymph node biopsy with targeted axillary lymph node excision: Invasive mammary carcinoma with mixed ductal and lobular features present in association with in situ carcinoma with mixed ductal and lobular features measuring 8.3 cm with positive inferior and lateral margin. Sentinel lymph node biopsy: 1/1 node positive for metastatic disease measuring 7 mm. No ECE. Palpable axillary lymph nodes: 4/4 lymph nodes with metastatic disease measuring 10 mm.  No ECE.  Additional palpable axillary lymph node 0/1 with isolated tumor cells. SAVI scout in the axilla was defective and there was no localization signal from the SAVI probe.         08/14/2017 Surgery     Left re-excision, ALND w ARM: Lateral margin: Invasive carcinoma with mixed ductal and lobular features measuring 7mm w LVI, margin negative.   Inferior margin: Multiple foci of lymphovasular  invasion present.  No evidence of stromal invasion (final margin negative)  Left ALND: ??Two of twenty-three lymph nodes positive for carcinoma (2/23).  Size of larger tumor deposit: 6 mm.  Extracapsular extension: Absent         09/21/2017 -  Chemotherapy     Started ddTaxol--->ddAC         09/21/2017 Adverse Reaction     First Taxol infused for ~20 minutes resulting in flushing with chest tightness and nausea.  Infusion was stopped and administered.  Symptoms subsided within 20 minutes but she continued to feel some chest tightness with deep inspiration.  Albuterol nebulizer was given.  She then returned to baseline and infusion was restarted at 1/2 rate and titrated up slowly.  She was able to complete infusion without any further events.          10/05/2017 -  Chemotherapy     Switching to Abraxane due to infusion reaction         10/11/2017 Adverse Reaction     Acute DVT Rt interval jugular, brachiocephalic, subclavian vein-now on Eliquis 5 mg qd.                 Past Medical History:  Patient Active Problem List   Diagnosis   ??? Malignant neoplasm of upper-outer quadrant of left breast in female, estrogen receptor positive (CMS-HCC)       Gyn History:   LMP 2012    Surgical History:   Past Surgical History:   Procedure Laterality Date   ??? ANAL FISSURECTOMY     ??? BREAST BIOPSY Left 06/2017 malignant   ??? BREAST BIOPSY Left 06/2017    Lympnode bx-positive   ??? IR INSERT PORT AGE GREATER THAN 5 YRS  09/28/2017    IR INSERT PORT AGE GREATER THAN 5 YRS 09/28/2017 Rush Barer, Erika Cabrera IMG VIR [facility]   ??? PR BX/REMV,LYMPH NODE,DEEP AXILL Left 07/03/2017    Procedure: BX/EXC LYMPH NODE; OPEN, DEEP AXILRY NODE;  Surgeon: Talbert Cage, DO;  Location: ASC OR [facility];  Service: Surgical Oncology   ??? PR INTRAOPERATIVE SENTINEL LYMPH NODE ID W DYE INJECTION Left 07/03/2017    Procedure: INTRAOPERATIVE IDENTIFICATION SENTINEL LYMPH NODE(S) INCLUDE INJECTION NON-RADIOACTIVE DYE, WHEN PERFORMED;  Surgeon: Talbert Cage, DO;  Location: ASC OR [facility];  Service: Surgical Oncology   ??? PR INTRAOPERATIVE SENTINEL LYMPH NODE ID W DYE INJECTION Left 08/14/2017    Procedure: INTRAOPERATIVE IDENTIFICATION SENTINEL LYMPH NODE(S) INCLUDE INJECTION NON-RADIOACTIVE DYE, WHEN PERFORMED;  Surgeon: Talbert Cage, DO;  Location: ASC OR [facility]  Service: Surgical Oncology Breast   ??? PR MASTECTOMY, PARTIAL Left 07/03/2017    Procedure: MASTECTOMY, savi scout PARTIAL (EG, LUMPECTOMY, TYLECTOMY, QUADRANTECTOMY, SEGMENTECTOMY);  Surgeon: Talbert Cage, DO;  Location: ASC OR [facility];  Service: Surgical Oncology   ??? PR MASTECTOMY, PARTIAL Left 08/14/2017    Procedure: MASTECTOMY, PARTIAL (EG, LUMPECTOMY, TYLECTOMY, QUADRANTECTOMY, SEGMENTECTOMY) Re-excision;  Surgeon: Talbert Cage, DO;  Location: ASC OR [facility];  Service: Surgical Oncology Breast   ??? PR REMOVE ARMPITS LYMPH NODES COMPLT Left 08/14/2017    Procedure: AXILLARY LYMPHADENECTOMY; COMPLETE;  Surgeon: Talbert Cage, DO;  Location: ASC OR [facility];  Service: Surgical Oncology Breast       Medications:  Current Outpatient Medications   Medication Sig Dispense Refill   ??? acetaminophen (TYLENOL) 500 MG tablet Take 1,000 mg by mouth every four (4) hours as needed for pain.     ??? apixaban (ELIQUIS) 5 mg (74 tabs)  DsPk The first 7 days of treatment, take 10mg  (2 tablets) by mouth twice a day after first week take 5mg  (1 tablet) twice a day. 74 tablet 0   ??? apixaban (ELIQUIS) 5 mg Tab Take 1 tablet (5 mg total) by mouth Two (2) times a day. 60 tablet 5   ??? buPROPion (WELLBUTRIN XL) 300 MG 24 hr tablet Take 300 mg by mouth daily.     ??? docusate sodium (COLACE) 100 MG capsule Take 100 mg by mouth daily with breakfast.     ??? FLUoxetine (PROZAC) 10 MG tablet Take 10 mg by mouth daily.     ??? GABAPENTIN ORAL Take 900 mg by mouth nightly. Pt taking 1 capsule at noon and 3 capsules at 8PM     ??? loratadine (CLARITIN ORAL) Take by mouth.     ??? ondansetron (ZOFRAN) 8 MG tablet Take 1 tablet (8 mg total) by mouth every twelve (12) hours as needed for nausea. 30 tablet 2   ??? ranitidine (ZANTAC) 150 MG tablet Take 150 mg by mouth daily.        No current facility-administered medications for this visit.        Allergies:  Allergies   Allergen Reactions   ??? Erythromycin Diarrhea   ??? Penicillins Rash       Personal and Social History:  Social History     Socioeconomic History   ??? Marital status: Married     Spouse name: Not on file   ??? Number of children: Not on file   ??? Years of education: Not on file   ??? Highest education level: Not on file   Occupational History   ??? Not on file   Social Needs   ??? Financial resource strain: Not on file   ??? Food insecurity:     Worry: Not on file     Inability: Not on file   ??? Transportation needs:     Medical: Not on file     Non-medical: Not on file   Tobacco Use   ??? Smoking status: Never Smoker   ??? Smokeless tobacco: Never Used   Substance and Sexual Activity   ??? Alcohol use: No   ??? Drug use: No   ??? Sexual activity: Not on file   Lifestyle   ??? Physical activity:     Days per week: Not on file     Minutes per session: Not on file   ??? Stress: Not on file   Relationships   ??? Social connections:     Talks on phone: Not on file     Gets together: Not on file     Attends religious service: Not on file     Active member of club or organization: Not on file     Attends meetings of clubs or organizations: Not on file     Relationship status: Not on file   ??? Intimate partner violence:     Fear of current or ex partner: Not on file     Emotionally abused: Not on file     Physically abused: Not on file     Forced sexual activity: Not on file   Other Topics Concern   ??? Not on file   Social History Narrative    The patient is married. She lives at home with her husband, Ed.  She has two children       Family History:  Cancer-related family history includes Breast cancer in her paternal aunt.  There is no history of Cancer or Ovarian cancer.  indicated that the status of her mother is unknown. She indicated that the status of her father is unknown. She indicated that the status of her sister is unknown. She indicated that the status of her maternal grandmother is unknown. She indicated that the status of her maternal grandfather is unknown. She indicated that the status of her paternal grandmother is unknown. She indicated that the status of her paternal grandfather is unknown. She indicated that the status of her daughter is unknown. She indicated that the status of her neg hx is unknown.      Review of Systems: A complete review of systems was obtained including: Constitutional, Eyes, ENT, Cardiovascular, Respiratory, GI, GU, Musculoskeletal, Skin, Neurological, Psychiatric, Endocrine, Heme/Lymphatic, and Allergic/Immunologic systems. It is negative or non-contributory to the patient???s management except for the following: None    Physical Examination:  Vital Signs: BP 129/74  - Pulse 95  - Temp 36.8 ??C (98.2 ??F) (Oral)  - Resp 18  - Wt 92.1 kg (203 lb)  - SpO2 99%  - BMI 31.49 kg/m??   General:  Healthy-appearing female in no acute distress..  Cardiovascular:  Heart not clinically enlarged.  No significant murmurs or gallops.  No lower extremity edema.    Respiratory:  Chest clear to percussion and auscultation.   Gastrointestinal:  Abdomen soft without masses and tenderness, no hepatosplenomegaly or other masses.   Musculoskeletal:  No bony pain or tenderness.   Skin and Subcutaneous Tissues:  Negative. No rash, ecchymoses, purpuric lesions noted.  Breasts:  Left breast with well healing scar.  Mild swelling lateral left breast+resolving seroma. Resolving erythema, no fluctuance. Left axilla tender with well healed scar. R breast normal appearing.  Psychiatric:  Alert and oriented.  Mood is normal.  No other symptoms.   Heme/Lymphatic/Immunologic:  No cervical or axillary adenopathy.   Upper Extremity Lymphedema: None.

## 2017-11-16 NOTE — Unmapped (Signed)
If you feel like this is an emergency please call 911.  For appointments or questions Monday through Friday 8AM-5PM please call [phone] or Toll Free [phone]. For Medical questions or concerns ask for the Nurse Triage Line.  On Nights, Weekends, and Holidays call [phone] and ask for the Oncologist on Call.  Reasons to call the Nurse Triage Line:  Fever of 100.5 or greater  Nausea and/or vomiting not relieved with nausea medicine  Diarrhea or constipation  Severe pain not relieved with usual pain regimen  Shortness of breath  Uncontrolled bleeding  Mental status changes    Lab on 11/16/2017   Component Date Value Ref Range Status   ??? Albumin 11/16/2017 4.6  3.5 - 5.0 g/dL Final   ??? Total Protein 11/16/2017 7.4  6.5 - 8.3 g/dL Final   ??? Total Bilirubin 11/16/2017 0.6  0.0 - 1.2 mg/dL Final   ??? Bilirubin, Direct 11/16/2017 0.20  0.00 - 0.40 mg/dL Final   ??? AST 96/29/5284 35  14 - 38 U/L Final   ??? ALT 11/16/2017 48  15 - 48 U/L Final   ??? Alkaline Phosphatase 11/16/2017 64  38 - 126 U/L Final   ??? WBC 11/16/2017 7.8  4.5 - 11.0 10*9/L Final   ??? RBC 11/16/2017 4.99  4.00 - 5.20 10*12/L Final   ??? HGB 11/16/2017 14.3  12.0 - 16.0 g/dL Final   ??? HCT 13/24/4010 42.2  36.0 - 46.0 % Final   ??? MCV 11/16/2017 84.6  80.0 - 100.0 fL Final   ??? MCH 11/16/2017 28.7  26.0 - 34.0 pg Final   ??? MCHC 11/16/2017 34.0  31.0 - 37.0 g/dL Final   ??? RDW 27/25/3664 15.1* 12.0 - 15.0 % Final   ??? MPV 11/16/2017 7.8  7.0 - 10.0 fL Final   ??? Platelet 11/16/2017 272  150 - 440 10*9/L Final   ??? Neutrophils % 11/16/2017 59.5  % Final   ??? Lymphocytes % 11/16/2017 32.3  % Final   ??? Monocytes % 11/16/2017 5.3  % Final   ??? Eosinophils % 11/16/2017 0.7  % Final   ??? Basophils % 11/16/2017 0.7  % Final   ??? Absolute Neutrophils 11/16/2017 4.6  2.0 - 7.5 10*9/L Final   ??? Absolute Lymphocytes 11/16/2017 2.5  1.5 - 5.0 10*9/L Final   ??? Absolute Monocytes 11/16/2017 0.4  0.2 - 0.8 10*9/L Final   ??? Absolute Eosinophils 11/16/2017 0.1  0.0 - 0.4 10*9/L Final   ??? Absolute Basophils 11/16/2017 0.1  0.0 - 0.1 10*9/L Final   ??? Large Unstained Cells 11/16/2017 2  0 - 4 % Final

## 2017-11-16 NOTE — Unmapped (Signed)
RSL POC accessed - patent, flushing, +BR. Labs drawn and sent for analysis.

## 2017-11-16 NOTE — Unmapped (Signed)
1120 Pt reports no issues. Pre med given.   1221 Abraxane started; completed at 1250. No complications noted.   1313 Port flushed; blood return noted; heparin locked and deaccessed. Pt given AVS and discharged ambulatory in NAD accompanied by caregiver.

## 2017-11-16 NOTE — Unmapped (Signed)
Pharmacy chemotherapy first cycle counseling    Patient Information: Ms.Galbreath is a 62 y.o. female with breast cancer who I have counseled prior to the initiation of dose dense doxorubicin and cyclophosphamide (AC).    Side effects of AC discussed included but were not limited to: acute/delayed nausea/vomiting, complications of myelosuppression, alopecia, urine discoloration, fatigue, taste changes, mucositis, and cardiotoxicity.  We discussed techniques to manage some of these toxicities including but not limited to: anti-emetics for N/V prevention and treatment, daily salt rinses for mucositis prevention, and low/moderate physical activity to help with fatigue.    The use of pegfilgratim was also reviewed.  She should be able to receive Udenyca at [facility] 24-48 hours after chemotherapy.  We discussed how to deal with pegfilgrastim-induced bone pain.  She plans to try loratadine 10 mg daily starting 3 days prior to getting her first cycle.  We also reviewed using acetaminophen if needed.     Handouts provided:  1. [facility] chemotherapy regimen handouts for [facility] and paclitaxel  2. Anti-emetic schedule for AC  3. Patient handout on pegfilgrastim administration    Patient verbalized understanding of the above the information as well as how to contact the Team with questions/concerns.  Approximate time spent with patient:  20 minutes

## 2017-11-28 NOTE — Unmapped (Signed)
Pt was calling because there are no labs set up for her appts on 5/17. She also needs a refill on her anti-nausea mediation sent to the pharmacy on file. She can be reached at [phone] for further questions.  Thanks in advance,  Deatra Ina  [facility]  [phone]

## 2017-11-29 NOTE — Unmapped (Signed)
AOC Triage Note     Patient: Erika Cabrera     Reason for call: Question about lab schedule and need for aniti-nausea medication refill    Time call returned: [phone]     Phone Assessment: Spoke with patient and states that she has a lab appointment prior to all of her clinic visits and that there is no lab scheduled for 12/21/17. Also stated that she spoke with Fonnie Birkenhead about 2 more nausea medications that they will add for her round of chemo starting on 12/07/17.     Triage Recommendations: Will reach out to care team about lab appointment and pharmacist about nausea medications.     Patient Response: Pt verbalizes understanding and is appreciative of phone call.    Outstanding tasks: Contact care team

## 2017-11-30 MED ORDER — ONDANSETRON HCL 4 MG TABLET
ORAL_TABLET | Freq: Three times a day (TID) | ORAL | 2 refills | 0 days | Status: CP | PRN
Start: 2017-11-30 — End: 2018-02-27

## 2017-11-30 MED ORDER — PROCHLORPERAZINE MALEATE 10 MG TABLET
ORAL_TABLET | Freq: Four times a day (QID) | ORAL | 2 refills | 0.00000 days | Status: CP | PRN
Start: 2017-11-30 — End: 2018-02-27

## 2017-11-30 MED ORDER — DEXAMETHASONE 4 MG TABLET
ORAL_TABLET | Freq: Every day | ORAL | 0 refills | 0.00000 days | Status: CP
Start: 2017-11-30 — End: 2018-02-27

## 2017-11-30 NOTE — Unmapped (Signed)
AOC Triage Note     Patient: Erika Cabrera     Reason for call: Called to notify patient that prescriptions had been sent to her pharmacy    Time call returned: [phone]     Phone Assessment: Notified patient that medications were sent to pharmacy and pt stated that she still needs lab appt prior to her 5/17 appt.     Triage Recommendations: Will reach out to scheduling to add on lab appt. On 5/17     Patient Response: Pt verbalizes understanding and is appreciative of phone call.       Outstanding tasks: Notify scheduling

## 2017-12-04 ENCOUNTER — Ambulatory Visit
Admit: 2017-12-04 | Discharge: 2017-12-23 | Payer: PRIVATE HEALTH INSURANCE | Attending: Rehabilitative and Restorative Service Providers" | Primary: Rehabilitative and Restorative Service Providers"

## 2017-12-04 ENCOUNTER — Encounter
Admit: 2017-12-04 | Discharge: 2017-12-23 | Payer: PRIVATE HEALTH INSURANCE | Attending: Rehabilitative and Restorative Service Providers" | Primary: Rehabilitative and Restorative Service Providers"

## 2017-12-04 DIAGNOSIS — C50412 Malignant neoplasm of upper-outer quadrant of left female breast: Principal | ICD-10-CM

## 2017-12-04 DIAGNOSIS — Z170 Estrogen receptor positive status [ER+]: Secondary | ICD-10-CM

## 2017-12-04 DIAGNOSIS — I890 Lymphedema, not elsewhere classified: Secondary | ICD-10-CM

## 2017-12-04 NOTE — Unmapped (Signed)
Marland Kitchen  OUTPATIENT PHYSICALTHERAPY  Upper Quadrant Lymphedema  Daily Note    Date:12/04/2017    Patient Name: Erika Cabrera  Date of Birth:July 15, 1956  Session #: 2  Diagnosis:   Encounter Diagnoses   Name Primary?   ??? Malignant neoplasm of upper-outer quadrant of left breast in female, estrogen receptor positive (CMS-HCC) Yes   ??? Lymphedema      Onset:  Referring Provider: Ophelia Shoulder*    Initial Evaluation and POC certification : 10/25/17-01/23/18      Contraindications:  none    Precautions/Red Flags:  history of cancer      ASSESSMENT/Reason for Referral:    62 y.o. female presents with Stage  II  lymphedema secondary  In chest and under axilla . Volume measurements retaken today and indicate affected arm is 11% smaller than the other side.  Performed DGI exam on patient today as patient complains of decreased balance from neuropathy enduced by chemo. DGI shows moderate balance exercises. Gave patient some static balance exercises to perform. Will progress as able      Recommendations for treatment/garments:  1) Patient will obtain proper garments to address swelling and improve tissue integrity. Garment recommendations for   upper extremity:20-30 mm hg Elvarex soft garment ( flat knit) plus a night time jovi pak with cover sleeve. Post lumpectomy jovi pak and bellisse bra.  Arm sleeve for rep movement, airplane travel, exercise.   2) Pt will perform self MLD 2x daily.   3) Pt to perform daily skin checks/ skin care to check for signs and symptoms of infection.  4) Pt will be seen clinically for  MLD to decrease swelling and improve skin integrity      Patient requires skilled Physical Therapy services  for the following problem list and secondary functional limitations:    Problem List:   Lack of knowledge of lymphedema and self care of this condition, Increased risk of infection, Lack of appropriate compression garment and Uncontrolled lymphedema swelling    Secondary Functional Limitations:  Decreased  ROM of arm and Scar/Myofascial  immobility    Barriers to learning:  no        Patient Goals: Decrease swelling, obtain a compression garment and decrease stiffness    Physical Therapy Goals:   In 3-4 sessions    Pt will have knowledge of proper skin care and lymphedema risk precautions to reduce risk of infection.  Patient will be able to properly demonstrate self-manual lymphatic drainage x1 in clinic to assist in management of lymphedema swelling  Pt. will independently don/doff and tolerate compression garment or compression bandages x1 in clinic  to ensure independent management of lymphedema.  Patient will demonstrate proper posture  to minimize pain and tightness in the affected quadrant.  MLD will be performed clinically to improve lymphangiomotoricity, thereby rerouting lymphatic flow to accomodate cancer treatment effects on the lymphatic system.    In 1-6 months:    Pt will be independent with all self care related to lymphedema.      Prognosis for goal achievement:   Good due to good motivation.        PLAN:   Within a 90 day certification period, schedule permitting, decreasing frequency as able, patient will participate in the following as needed: Manual lymphatic drainage, compression bandaging or use of reduction kits, appropriate garment recommendations, skin care and therapeutic exercise, manual therapy, low level laser therapy, ROM and strength training, education on posture and body mechanics, taping, and pump/ equipment  recommendations     Planned frequency and duration  of treatment:    starting in 3 weeks 2x week for 2 weeks then 1x week for 6 weeks.           SUBJECTIVE:  Patient???s communication preference: verbal, written, visual, prn     Pt reports seroma is  still there. Reports she got her compression sleeve and  Bra/ jovi pak. However reports bra feels too tight.     Onset of swelling:  Since drains removed.      Location of pain: No pain  Current Pain:  0/10     History of Present Illness/ Pt reports:   Pt reports recurrent Seroma since drains removed . Has been drained multiple times 1s time 360 ml, second time 310,  35 mil, 3rd time 55 ml, 4th time 48 ml.  Pt reports has not noticed swelling in extremity.  Some ROM  Deficits/ tightness noted.         Home environment:       Lives with family. Has two kids in college   Sleeping position: bed     Pt has caregiver available, capable and willing to help:yes     Occupation/ Activities/Recreation:        Occupational History   ??? Not on file       Prior Functional Status:   Independent    Current Functional Limitations: The patient reports the following limitations based on verbal description and a Quick DASH score of  which indicates 6% impairment of the upper extremity:  arm, shoulder, and hand pain      Sexual Dysfunction or Bowel or Bladder changes : none    History of infections: no      Previous Lymphedema Treatment Type: none    Current management: n/a           Medical hx/conditions/surgical procedures, medications, allergies:    Reviewed      Past Medical History:   Diagnosis Date   ??? Breast cancer (CMS-HCC) 06/2017    Left-Per bx results in [facility]   ??? Depression    ??? GERD (gastroesophageal reflux disease)     Family History   Problem Relation Age of Onset   ??? No Known Problems Mother    ??? No Known Problems Father    ??? No Known Problems Sister    ??? No Known Problems Daughter    ??? No Known Problems Maternal Grandmother    ??? No Known Problems Maternal Grandfather    ??? No Known Problems Paternal Grandmother    ??? No Known Problems Paternal Grandfather    ??? Breast cancer Paternal Aunt         49s   ??? Colon cancer Paternal Aunt    ??? BRCA 1/2 Neg Hx    ??? Cancer Neg Hx    ??? Endometrial cancer Neg Hx    ??? Ovarian cancer Neg Hx       Past Surgical History:   Procedure Laterality Date   ??? ANAL FISSURECTOMY     ??? BREAST BIOPSY Left 06/2017    malignant   ??? BREAST BIOPSY Left 06/2017    Lympnode bx-positive   ??? IR INSERT PORT AGE GREATER THAN 5 YRS  09/28/2017    IR INSERT PORT AGE GREATER THAN 5 YRS 09/28/2017 Rush Barer, MD IMG VIR [facility]   ??? PR BX/REMV,LYMPH NODE,DEEP AXILL Left 07/03/2017    Procedure: BX/EXC LYMPH NODE; OPEN,  DEEP AXILRY NODE;  Surgeon: Talbert Cage, DO;  Location: ASC OR [facility];  Service: Surgical Oncology   ??? PR INTRAOPERATIVE SENTINEL LYMPH NODE ID W DYE INJECTION Left 07/03/2017    Procedure: INTRAOPERATIVE IDENTIFICATION SENTINEL LYMPH NODE(S) INCLUDE INJECTION NON-RADIOACTIVE DYE, WHEN PERFORMED;  Surgeon: Talbert Cage, DO;  Location: ASC OR [facility];  Service: Surgical Oncology   ??? PR INTRAOPERATIVE SENTINEL LYMPH NODE ID W DYE INJECTION Left 08/14/2017    Procedure: INTRAOPERATIVE IDENTIFICATION SENTINEL LYMPH NODE(S) INCLUDE INJECTION NON-RADIOACTIVE DYE, WHEN PERFORMED;  Surgeon: Talbert Cage, DO;  Location: ASC OR [facility];  Service: Surgical Oncology Breast   ??? PR MASTECTOMY, PARTIAL Left 07/03/2017    Procedure: MASTECTOMY, savi scout PARTIAL (EG, LUMPECTOMY, TYLECTOMY, QUADRANTECTOMY, SEGMENTECTOMY);  Surgeon: Talbert Cage, DO;  Location: [facility] OR [facility];  Service: Surgical Oncology   ??? PR MASTECTOMY, PARTIAL Left 08/14/2017    Procedure: MASTECTOMY, PARTIAL (EG, LUMPECTOMY, TYLECTOMY, QUADRANTECTOMY, SEGMENTECTOMY) Re-excision;  Surgeon: Talbert Cage, DO;  Location: ASC OR [facility];  Service: Surgical Oncology Breast   ??? PR REMOVE ARMPITS LYMPH NODES COMPLT Left 08/14/2017    Procedure: AXILLARY LYMPHADENECTOMY; COMPLETE;  Surgeon: Talbert Cage, DO;  Location: ASC OR [facility];  Service: Surgical Oncology Breast    Allergies   Allergen Reactions   ??? Erythromycin Diarrhea   ??? Penicillins Rash      Social History     Tobacco Use   ??? Smoking status: Never Smoker   ??? Smokeless tobacco: Never Used   Substance Use Topics   ??? Alcohol use: No    Current Outpatient Medications   Medication Sig Dispense Refill   ??? acetaminophen (TYLENOL) 500 MG tablet Take 1,000 mg by mouth every four (4) hours as needed for pain.     ??? apixaban (ELIQUIS) 5 mg Tab Take 1 tablet (5 mg total) by mouth Two (2) times a day. 60 tablet 5   ??? buPROPion (WELLBUTRIN XL) 300 MG 24 hr tablet Take 300 mg by mouth daily.     ??? dexamethasone (DECADRON) 4 MG tablet Take 2 tablets (8 mg total) by mouth daily. Take on Days 2, 3, and 4. 24 tablet 0   ??? docusate sodium (COLACE) 100 MG capsule Take 100 mg by mouth daily with breakfast.     ??? FLUoxetine (PROZAC) 10 MG tablet Take 10 mg by mouth daily.     ??? GABAPENTIN ORAL Take 900 mg by mouth nightly. Pt taking 1 capsule at noon and 3 capsules at 8PM     ??? loratadine (CLARITIN ORAL) Take by mouth.     ??? LORazepam (ATIVAN) 0.5 MG tablet Take 0.5 mg by mouth nightly as needed (take for difficulty sleeping on nights taking dexamethasone).     ??? ondansetron (ZOFRAN) 4 MG tablet Take 2 tablets (8 mg total) by mouth every eight (8) hours as needed for nausea. 30 tablet 2   ??? ondansetron (ZOFRAN) 8 MG tablet Take 1 tablet (8 mg total) by mouth every twelve (12) hours as needed for nausea. 30 tablet 2   ??? polyethylene glycol (MIRALAX) 17 gram packet Take 17 g by mouth daily.     ??? prochlorperazine (COMPAZINE) 10 MG tablet Take 1 tablet (10 mg total) by mouth every six (6) hours as needed (nausea). 30 tablet 2   ??? ranitidine (ZANTAC) 150 MG tablet Take 150 mg by mouth daily.        No current facility-administered medications for this visit.  Recent Procedures/Tests/Findings:  Oncology History    2018: Left breast cT2 N1a, IDC, G1, +/+/-.        Malignant neoplasm of upper-outer quadrant of left breast in female, estrogen receptor positive (CMS-HCC)    06/07/2017 -  Presenting Symptoms     Left breast palpable UOQ mass         06/07/2017 Interval Scan(s)     Diagnostic MMG at [facility]: left breast UOQ mass difficult to measure, but appears to measure at least 5.6 cm.    [place]: left breast ill defined shadowing lesion at 2:00, 6 CFN measuring 3.7 x 2.6 x 2.5 cm. Single mildly abnormal node in left axilla with constriction of fatty hilum.         06/13/2017 Biopsy     [facility] core biopsy of left breast 2:00, 6 CFN: IDC, G1, ER+(95%), PR+(30%), HER2 negative by FISH.  [facility] core biopsy left axillary LN: +LN metastasis.         06/13/2017 -  Other     Seen in [facility] ED following core biopsy in [facility] for axillary pain with non-expanding 4 cm hematoma noted in left axilla. No intervention required other than symptomatic measures (compression, pain management).         06/20/2017 Tumor Board     [facility] recs: left breast cT2 N1a IDC, G1, receptors pending. Per outside imaging there was a single abnormal LN found by [place] only. Awaiting receptors. Need SAVI into that clipped node. Discussed that she otherwise fits ACOSOG Z0011 criteria with negative clinical exam prior to biopsy and single abnormal LN on axillary [place]. Reasonable to do SLNB and excise biopsied node as well. If receptors favorable, consider surgery first approach, good BCT candidate. If high risk (TN, HER+) may need NACT approach. Briefly discussed ALTERNATE, but consensus was that she will likely be recommended for chemo given age and nodal positivity. Meeting surgery, rad/onc today. Will circle back with med/onc as needed.         07/03/2017 Surgery     Left [facility] localized/US guided partial mastectomy and left sentinel lymph node biopsy with targeted axillary lymph node excision: Invasive mammary carcinoma with mixed ductal and lobular features present in association with in situ carcinoma with mixed ductal and lobular features measuring 8.3 cm with positive inferior and lateral margin. Sentinel lymph node biopsy: 1/1 node positive for metastatic disease measuring 7 mm. No ECE. Palpable axillary lymph nodes: 4/4 lymph nodes with metastatic disease measuring 10 mm.  No ECE.  Additional palpable axillary lymph node 0/1 with isolated tumor cells. SAVI scout in the axilla was defective and there was no localization signal from the SAVI probe.         08/14/2017 Surgery     Left re-excision, ALND w ARM: Lateral margin: Invasive carcinoma with mixed ductal and lobular features measuring 7mm w LVI, margin negative.   Inferior margin: Multiple foci of lymphovasular invasion present.  No evidence of stromal invasion (final margin negative)  Left ALND: ??Two of twenty-three lymph nodes positive for carcinoma (2/23).  Size of larger tumor deposit: 6 mm.  Extracapsular extension: Absent         09/21/2017 -  Chemotherapy     Started ddTaxol--->ddAC         09/21/2017 Adverse Reaction     First Taxol infused for ~20 minutes resulting in flushing with chest tightness and nausea.  Infusion was stopped and administered.  Symptoms subsided within 20 minutes but she  continued to feel some chest tightness with deep inspiration.  Albuterol nebulizer was given.  She then returned to baseline and infusion was restarted at 1/2 rate and titrated up slowly.  She was able to complete infusion without any further events.          10/05/2017 -  Chemotherapy     Switching to Abraxane due to infusion reaction         10/11/2017 Adverse Reaction     Acute DVT Rt interval jugular, brachiocephalic, subclavian vein-now on Eliquis 5 mg qd.          12/05/2017 -  Chemotherapy     Chemotherapy Treatment    Treatment Goal Curative   Line of Treatment [No plan line of treatment]   Plan Name OP BREAST AC (DOSE DENSE)   Start Date 12/07/2017   End Date 01/19/2018 (Planned)   Provider Willeen Niece, MD   Chemotherapy dexamethasone (DECADRON) tablet 12 mg, 12 mg, Oral, Once, 1 of 4 cycles  Administration: 12 mg (12/07/2017)  cyclophosphamide (CYTOXAN) 1,254 mg in sodium chloride (NS) 0.9 % 250 mL IVPB, 600 mg/m2 = 1,254 mg, Intravenous, Once, 1 of 4 cycles  Administration: 1,254 mg (12/07/2017)  DOXOrubicin (ADRIAMYCIN) syringe 125.4 mg, 60 mg/m2 = 125.4 mg, Intravenous, Once, 1 of 4 cycles  Administration: 125.4 mg (12/07/2017)                 OBJECTIVE:      Girth Measurements:      DYNAMIC GAIT INDEX    Grading: record the lowest category that applies.   1.Gait level surface: 3  Instructions: Walk at your normal speed from here to the next mark (20???).     (3) Normal: walks 20???, no assistive devices, good speed, no evidence for imbalance, normal gait pattern.   (2) Mild impairment: walks 20???, uses assistive devices, slower speed, mild gait deviations.   (1) Moderate impairment: walks 20???, slow speed, abnormal gait patters, evidence for imbalance.   (0) Severe impairment: cannot walk 20??? without assistance, severe gait deviations or imbalance.     2.Change in gait speed: 3  Instructions: Begin walking at your normal pace (for 5???), when I tell you ???go???, walk as fast as you can (for 5???). When I tell you ???slow???, walk as slowly as you can (for 5???).     (3) Normal: Able to smoothly change walking speed without loss of balance or gait deviation. Shows significant difference in walking speeds between normal, fast and slow paces.   (2) Mild impairment: Is able to change speed but demonstrates mild gait deviations, or no gait deviations but unable to achieve a significant change in velocity, or uses as assistive device.   (1) Moderate impairment: Makes only minor adjustments to walking speed, or accomplishes a change in speed with significant gait deviations, or changes speed but loses balance but is able to recover and continue walking.   (0) Severe impairment: Cannot change speeds, or loss balance and has to reach for a wall or be caught.     3.Gait with horizontal head turns: 1  Instructions: Begin walking at your normal pace. When I tell you to ???look right???, keep walking straight, but turn your head to the right. Keep looking to the right until I tell you ???look left???, then keep walking straight and turn your head to the left. Keep your head to the left until I tell you, ???look straight???, then keep walking straight, but return your head to  the centre.     (3) Normal: Performs head turns smoothly with no change in gait.   (2) Mild impairment: Performs head turns smoothly with slight change in gait velocity, i.e. minor disruption to smooth gait path or uses walking aid.   (1) Moderate impairment: Performs head turns with moderate change in gait velocity, slows down, staggers, but recovers, can continue to walk.   (0) Severe impairment: Performs task with severe disruption of gait, i.e. staggers outside 15??? path, loses balance, stops, reaches for wall.     4.Gait with vertical head turns:3  Instructions: Begin walking at your normal pace. When I tell you to ???look up???, keep walking straight, but tip your head and look up. Keep looking up until I tell you, ???look down???. Then keep walking straight and turn your head down. Keep looking down until I tell you, ??? look straight???, then keep walking straight, but return your head to the centre.     (3) Normal: Performs head turns smoothly with no change in gait.   (2) Mild impairment: Performs head turns smoothly with slight change in gait velocity, i.e. minor disruption to smooth gait path or uses walking aid.   (1) Moderate impairment: Performs head turns with moderate change in gait velocity, slows down, staggers, but recovers, can continue to walk.   (0) Severe impairment: Performs task with severe disruption of gait, i.e. staggers outside 15??? path, loses balance, stops, reaches for wall.     5.Gait and pivot turn: 1  Instructions: Begin walking at your normal pace. When I tell you, ???turn and stop???, turn as quickly as you can to face the opposite direction and stop.     (3) Normal: Pivot turns safely within 3 seconds and stops quickly with no loss of balance.   (2) Mild impairment: pivot turns safely in >3 seconds and stops with no loss of balance.   (1) Moderate impairment: Turns slowly, requires verbal cueing, requires several small steps to catch balance following turn and stop.   (0) Severe impairment: Cannot turn safely, requires assistance to turn and stop.     6.Step over obstacle: 2  Instructions: Begin walking at your normal speed. When you come to the shoebox, step over it, not around it, and keep walking.     (3) Normal: Is able to step over box without changing gait speed; no evidence for imbalance.   (2) Mild impairment: Is able to step over shoe box, but must slow down and adjust steps to clear box safely.   (1) Moderate impairment: Is able to step over box but must stop, then step over. May require verbal cueing.   (0) Severe impairment: Cannot perform without assistance.     7.Step around obstacles: 3  Instructions: Begin walking at normal speed. When you come to the first cone (about 6??? away), walk around the right side of it. When you some to the second cone (6??? past first cone), walk around it to the left.     (3) Normal: Is able to walk safely around cones safely without changing gait speed; no evidence of imbalance.   (2) Mild impairment: Is able to step around both cones, but must slow down and adjust steps to clear cones.   (1) Moderate impairment: Is able to clear cones but must significantly slow speed to accomplish task, or requires verbal cueing.   (0) Severe impairment: Unable to clear cones, walks into one or both cones, or requires physical assistance.  8.Steps: 2  Instructions: Walk up these stairs as you would at home.(i.e. using a rail if necessary. At the top, turn around and walk down.     (3) Normal: Alternating feet, no rail.   (2) Mild impairment: Alternating feet, must use rail.   (1) Moderate impairment: Two feet to a stair, must use rail.   (0) Severe impairment: Cannot do safely.     TOTAL SCORE: 18/24  F:\Intran[employer]\[facility] website\physiotherapy section\Dynamic Gait Index v.doc       Location of swelling:  L chest and axilla.     Skin: WFL except for the following:  increased skin thickness    Stemmer???s sign:no.      Sensation Intact to light touch:   Decreased sensation under axilla, around scar, and posterior lateral upper arm     Incision: healing appropriately for post op status, adhered to underlying tissue    Axillary Web Syndrome::yes     Posture/Observations:  Forward head, rounded shoulders        Range of Motion/Flexibilty:   UE WFL except  End range overhead activity.     Strength/MMT:   UE WFL     Total Treatment Time: 60 min   Neuro- reed    X 30 min    administered DGI    - balance exercises to add to HEP include     - Static balance eyes closed    - romberg balance.     - tandem balance 3x30sec    Manual therapy x 15 min    Measured volumes BUE    Self care x 15 min    - instructed pt regarding arm sleeve use (airplane travel, rep movement, exercise)   - checked for signs of swelling.   Today's Charges (noted here with $$):       ADLs/IADLs   $$ PT Self Care/Home Management Training [mins]: 15  Therapeutic Interventions  $$ Manual Therapy [mins]: 15  $$ Neuromuscular Re-education [mins]: 30               Data from the patient???s history and examination indicates the presence of 1-2 personal factors and/or comorbidities that will impact the plan of care for the current problem, including cancer history Examination of body systems includes 4 or more  body structures, functions, activity limitations and/or participation restrictions including integumentary system, lymphatic system, self care and scar mobility. The assessed clinical presentation is evolving based on  changing lymphatic status. These factors result in the need for moderate clinical decision making.    Equipment provided/recommended:   written HEP    Communication/consultation with other professionals:  Email to DME supplier [employer] re: garment needs        I attest that I have reviewed the above information.  Signed: Kirke Corin, PT   12/04/2017 10:41 AM

## 2017-12-06 DIAGNOSIS — C50412 Malignant neoplasm of upper-outer quadrant of left female breast: Principal | ICD-10-CM

## 2017-12-06 DIAGNOSIS — Z170 Estrogen receptor positive status [ER+]: Secondary | ICD-10-CM

## 2017-12-06 DIAGNOSIS — I890 Lymphedema, not elsewhere classified: Secondary | ICD-10-CM

## 2017-12-06 NOTE — Unmapped (Signed)
Marland Kitchen  OUTPATIENT PHYSICALTHERAPY  Upper Quadrant Lymphedema   Daily Note  Date:12/06/2017    Patient Name: Erika Cabrera  Date of Birth:01-02-1956  Session #:3  Diagnosis:   Encounter Diagnoses   Name Primary?   ??? Malignant neoplasm of upper-outer quadrant of left breast in female, estrogen receptor positive (CMS-HCC)    ??? Lymphedema Yes     Onset:  Referring Provider: Ophelia Shoulder*    Initial Evaluation and POC certification : 10/25/17-01/23/18      Contraindications:  none    Precautions/Red Flags:  history of cancer      ASSESSMENT/Reason for Referral:    62 y.o. female presents with Stage  II  lymphedema secondary  In chest and under axilla . Currently the affected limb is 6.75 %  smaller than the unaffected limb. Today's session focused on MLD, scar mobs,and cording stretch which pt seems to tolerate well.      Recommendations for treatment/garments:  1) Patient will obtain proper garments to address swelling and improve tissue integrity. Garment recommendations for   upper extremity:20-30 mm hg Elvarex soft garment ( flat knit) plus a night time jovi pak with cover sleeve. Post lumpectomy jovi pak and bellisse bra.  Arm sleeve for rep movement, airplane travel, exercise.   2) Pt will perform self MLD 2x daily.   3) Pt to perform daily skin checks/ skin care to check for signs and symptoms of infection.  4) Pt will be seen clinically for  MLD to decrease swelling and improve skin integrity      Patient requires skilled Physical Therapy services  for the following problem list and secondary functional limitations:    Problem List:   Lack of knowledge of lymphedema and self care of this condition, Increased risk of infection, Lack of appropriate compression garment and Uncontrolled lymphedema swelling    Secondary Functional Limitations:  Decreased  ROM of arm and Scar/Myofascial  immobility    Barriers to learning:  no        Patient Goals: Decrease swelling, obtain a compression garment and decrease stiffness    Physical Therapy Goals:   In 3-4 sessions    Pt will have knowledge of proper skin care and lymphedema risk precautions to reduce risk of infection.  Patient will be able to properly demonstrate self-manual lymphatic drainage x1 in clinic to assist in management of lymphedema swelling  Pt. will independently don/doff and tolerate compression garment or compression bandages x1 in clinic  to ensure independent management of lymphedema.  Patient will demonstrate proper posture  to minimize pain and tightness in the affected quadrant.  MLD will be performed clinically to improve lymphangiomotoricity, thereby rerouting lymphatic flow to accomodate cancer treatment effects on the lymphatic system.    In 1-6 months:    Pt will be independent with all self care related to lymphedema.      Prognosis for goal achievement:   Good due to good motivation.        PLAN:    mechanics, taping, and pump/ equipment  recommendations     Planned frequency and duration  of treatment:    2x week for 2 weeks then 1x week for 6 weeks.            SUBJECTIVE:  Patient???s communication preference: verbal, written, visual, prn      Has been fatigued but overall, doing ok    Onset of swelling:  Since drains removed.      Location of pain:  No pain  Current Pain:  0/10     History of Present Illness/ Pt reports:   Pt reports recurrent Seroma since drains removed . Has been drained multiple times 1s time 360 ml, second time 310,  35 mil, 3rd time 55 ml, 4th time 48 ml.  Pt reports has not noticed swelling in extremity.  Some ROM  Deficits/ tightness noted.         Home environment:       Lives with family. Has two kids in college   Sleeping position: bed     Pt has caregiver available, capable and willing to help:yes     Occupation/ Activities/Recreation:        Occupational History   ??? Not on file       Prior Functional Status:   Independent    Current Functional Limitations: The patient reports the following limitations based on verbal description and a Quick DASH score of  which indicates 6% impairment of the upper extremity:  arm, shoulder, and hand pain      Sexual Dysfunction or Bowel or Bladder changes : none    History of infections: no      Previous Lymphedema Treatment Type: none    Current management: n/a           Medical hx/conditions/surgical procedures, medications, allergies:    Reviewed      Recent Procedures/Tests/Findings:  Oncology History    2018: Left breast cT2 N1a, IDC, G1, +/+/-.        Malignant neoplasm of upper-outer quadrant of left breast in female, estrogen receptor positive (CMS-HCC)    06/07/2017 -  Presenting Symptoms     Left breast palpable UOQ mass         06/07/2017 Interval Scan(s)     Diagnostic [facility] at [facility]: left breast UOQ mass difficult to measure, but appears to measure at least 5.6 cm.    [place]: left breast ill defined shadowing lesion at 2:00, 6 CFN measuring 3.7 x 2.6 x 2.5 cm.     Single mildly abnormal node in left axilla with constriction of fatty hilum.         06/13/2017 Biopsy     [facility] core biopsy of left breast 2:00, 6 CFN: IDC, G1, ER+(95%), PR+(30%), HER2 negative by FISH.  [facility] core biopsy left axillary LN: +LN metastasis.         06/13/2017 -  Other     Seen in [facility] ED following core biopsy in [facility] for axillary pain with non-expanding 4 cm hematoma noted in left axilla. No intervention required other than symptomatic measures (compression, pain management).         06/20/2017 Tumor Board     [facility] recs: left breast cT2 N1a IDC, G1, receptors pending. Per outside imaging there was a single abnormal LN found by [place] only. Awaiting receptors. Need SAVI into that clipped node. Discussed that she otherwise fits ACOSOG Z0011 criteria with negative clinical exam prior to biopsy and single abnormal LN on axillary [place]. Reasonable to do SLNB and excise biopsied node as well. If receptors favorable, consider surgery first approach, good BCT candidate. If high risk (TN, HER+) may need NACT approach. Briefly discussed ALTERNATE, but consensus was that she will likely be recommended for chemo given age and nodal positivity. Meeting surgery, rad/onc today. Will circle back with med/onc as needed.         07/03/2017 Surgery     Left [facility] localized/US guided partial  mastectomy and left sentinel lymph node biopsy with targeted axillary lymph node excision: Invasive mammary carcinoma with mixed ductal and lobular features present in association with in situ carcinoma with mixed ductal and lobular features measuring 8.3 cm with positive inferior and lateral margin. Sentinel lymph node biopsy: 1/1 node positive for metastatic disease measuring 7 mm. No ECE. Palpable axillary lymph nodes: 4/4 lymph nodes with metastatic disease measuring 10 mm.  No ECE.  Additional palpable axillary lymph node 0/1 with isolated tumor cells. SAVI scout in the axilla was defective and there was no localization signal from the SAVI probe.         08/14/2017 Surgery     Left re-excision, ALND w ARM: Lateral margin: Invasive carcinoma with mixed ductal and lobular features measuring 7mm w LVI, margin negative.   Inferior margin: Multiple foci of lymphovasular invasion present.  No evidence of stromal invasion (final margin negative)  Left ALND: ??Two of twenty-three lymph nodes positive for carcinoma (2/23).  Size of larger tumor deposit: 6 mm.  Extracapsular extension: Absent         09/21/2017 -  Chemotherapy     Started ddTaxol--->ddAC         09/21/2017 Adverse Reaction     First Taxol infused for ~20 minutes resulting in flushing with chest tightness and nausea.  Infusion was stopped and administered.  Symptoms subsided within 20 minutes but she continued to feel some chest tightness with deep inspiration.  Albuterol nebulizer was given.  She then returned to baseline and infusion was restarted at 1/2 rate and titrated up slowly.  She was able to complete infusion without any further events. 10/05/2017 -  Chemotherapy     Switching to Abraxane due to infusion reaction         10/11/2017 Adverse Reaction     Acute DVT Rt interval jugular, brachiocephalic, subclavian vein-now on Eliquis 5 mg qd.          12/05/2017 -  Chemotherapy     Chemotherapy Treatment    Treatment Goal Curative   Line of Treatment [No plan line of treatment]   Plan Name OP BREAST AC (DOSE DENSE)   Start Date 12/07/2017 (Planned)   End Date 01/19/2018 (Planned)   Provider Willeen Niece, MD   Chemotherapy dexamethasone (DECADRON) tablet 12 mg, 12 mg, Oral, Once, 0 of 4 cycles  cyclophosphamide (CYTOXAN) 1,254 mg in sodium chloride (NS) 0.9 % 250 mL IVPB, 600 mg/m2, Intravenous, Once, 0 of 4 cycles  DOXOrubicin (ADRIAMYCIN) syringe 125.4 mg, 60 mg/m2, Intravenous, Once, 0 of 4 cycles                 OBJECTIVE:      Girth Measurements:      DYNAMIC GAIT INDEX    Grading: record the lowest category that applies.   1.Gait level surface: 3  Instructions: Walk at your normal speed from here to the next mark (20???).     (3) Normal: walks 20???, no assistive devices, good speed, no evidence for imbalance, normal gait pattern.   (2) Mild impairment: walks 20???, uses assistive devices, slower speed, mild gait deviations.   (1) Moderate impairment: walks 20???, slow speed, abnormal gait patters, evidence for imbalance.   (0) Severe impairment: cannot walk 20??? without assistance, severe gait deviations or imbalance.     2.Change in gait speed: 3  Instructions: Begin walking at your normal pace (for 5???), when I tell you ???go???, walk as fast as you can (  for 5???). When I tell you ???slow???, walk as slowly as you can (for 5???).     (3) Normal: Able to smoothly change walking speed without loss of balance or gait deviation. Shows significant difference in walking speeds between normal, fast and slow paces.   (2) Mild impairment: Is able to change speed but demonstrates mild gait deviations, or no gait deviations but unable to achieve a significant change in velocity, or uses as assistive device.   (1) Moderate impairment: Makes only minor adjustments to walking speed, or accomplishes a change in speed with significant gait deviations, or changes speed but loses balance but is able to recover and continue walking.   (0) Severe impairment: Cannot change speeds, or loss balance and has to reach for a wall or be caught.     3.Gait with horizontal head turns: 1  Instructions: Begin walking at your normal pace. When I tell you to ???look right???, keep walking straight, but turn your head to the right. Keep looking to the right until I tell you ???look left???, then keep walking straight and turn your head to the left. Keep your head to the left until I tell you, ???look straight???, then keep walking straight, but return your head to the centre.     (3) Normal: Performs head turns smoothly with no change in gait.   (2) Mild impairment: Performs head turns smoothly with slight change in gait velocity, i.e. minor disruption to smooth gait path or uses walking aid.   (1) Moderate impairment: Performs head turns with moderate change in gait velocity, slows down, staggers, but recovers, can continue to walk.   (0) Severe impairment: Performs task with severe disruption of gait, i.e. staggers outside 15??? path, loses balance, stops, reaches for wall.     4.Gait with vertical head turns:3  Instructions: Begin walking at your normal pace. When I tell you to ???look up???, keep walking straight, but tip your head and look up. Keep looking up until I tell you, ???look down???. Then keep walking straight and turn your head down. Keep looking down until I tell you, ??? look straight???, then keep walking straight, but return your head to the centre.     (3) Normal: Performs head turns smoothly with no change in gait.   (2) Mild impairment: Performs head turns smoothly with slight change in gait velocity, i.e. minor disruption to smooth gait path or uses walking aid.   (1) Moderate impairment: Performs head turns with moderate change in gait velocity, slows down, staggers, but recovers, can continue to walk.   (0) Severe impairment: Performs task with severe disruption of gait, i.e. staggers outside 15??? path, loses balance, stops, reaches for wall.     5.Gait and pivot turn: 1  Instructions: Begin walking at your normal pace. When I tell you, ???turn and stop???, turn as quickly as you can to face the opposite direction and stop.     (3) Normal: Pivot turns safely within 3 seconds and stops quickly with no loss of balance.   (2) Mild impairment: pivot turns safely in >3 seconds and stops with no loss of balance.   (1) Moderate impairment: Turns slowly, requires verbal cueing, requires several small steps to catch balance following turn and stop.   (0) Severe impairment: Cannot turn safely, requires assistance to turn and stop.     6.Step over obstacle: 2  Instructions: Begin walking at your normal speed. When you come to the shoebox, step over it, not around  it, and keep walking.     (3) Normal: Is able to step over box without changing gait speed; no evidence for imbalance.   (2) Mild impairment: Is able to step over shoe box, but must slow down and adjust steps to clear box safely.   (1) Moderate impairment: Is able to step over box but must stop, then step over. May require verbal cueing.   (0) Severe impairment: Cannot perform without assistance.     7.Step around obstacles: 3  Instructions: Begin walking at normal speed. When you come to the first cone (about 6??? away), walk around the right side of it. When you some to the second cone (6??? past first cone), walk around it to the left.     (3) Normal: Is able to walk safely around cones safely without changing gait speed; no evidence of imbalance.   (2) Mild impairment: Is able to step around both cones, but must slow down and adjust steps to clear cones.   (1) Moderate impairment: Is able to clear cones but must significantly slow speed to accomplish task, or requires verbal cueing.   (0) Severe impairment: Unable to clear cones, walks into one or both cones, or requires physical assistance.     8.Steps: 2  Instructions: Walk up these stairs as you would at home.(i.e. using a rail if necessary. At the top, turn around and walk down.     (3) Normal: Alternating feet, no rail.   (2) Mild impairment: Alternating feet, must use rail.   (1) Moderate impairment: Two feet to a stair, must use rail.   (0) Severe impairment: Cannot do safely.     TOTAL SCORE: 18/24  F:\[employer]\[facility] website\physiotherapy section\Dynamic Gait Index v.doc       Location of swelling:  L chest and axilla.     Skin: WFL except for the following:  increased skin thickness    Stemmer???s sign:no.      Sensation Intact to light touch:   Decreased sensation under axilla, around scar, and posterior lateral upper arm     Incision: healing appropriately for post op status, adhered to underlying tissue    Axillary Web Syndrome::yes     Posture/Observations:  Forward head, rounded shoulders        Range of Motion/Flexibilty:   UE WFL except  End range overhead activity.     Strength/MMT:   UE WFL     Total Treatment Time: 60 min        Treatment Rendered:      Self Care x 15 min   - check fit of bra and jovi pak   Manual therapy  X 45 min    - LUQ MLD.    - PROM R Shoulder all planes   - cording stretch            Today's Charges (noted here with $$):                          Data from the patient???s history and examination indicates the presence of 1-2 personal factors and/or comorbidities that will impact the plan of care for the current problem, including cancer history Examination of body systems includes 4 or more  body structures, functions, activity limitations and/or participation restrictions including integumentary system, lymphatic system, self care and scar mobility. The assessed clinical presentation is evolving based on  changing lymphatic status. These factors result in the need for moderate clinical decision making.  Equipment provided/recommended:   written HEP    Communication/consultation with other professionals:  Email to DME supplier [employer] re: garment needs        I attest that I have reviewed the above information.  Signed: Kirke Corin, PT   12/06/2017 10:31 AM

## 2017-12-07 ENCOUNTER — Encounter: Admit: 2017-12-07 | Discharge: 2017-12-08 | Payer: PRIVATE HEALTH INSURANCE

## 2017-12-07 ENCOUNTER — Encounter: Admit: 2017-12-07 | Discharge: 2017-12-08 | Payer: PRIVATE HEALTH INSURANCE | Attending: Family | Primary: Family

## 2017-12-07 DIAGNOSIS — Z170 Estrogen receptor positive status [ER+]: Secondary | ICD-10-CM

## 2017-12-07 DIAGNOSIS — C50412 Malignant neoplasm of upper-outer quadrant of left female breast: Principal | ICD-10-CM

## 2017-12-07 LAB — CBC W/ AUTO DIFF
BASOPHILS RELATIVE PERCENT: 0.9 %
EOSINOPHILS ABSOLUTE COUNT: 0.1 10*9/L (ref 0.0–0.4)
EOSINOPHILS RELATIVE PERCENT: 0.6 %
HEMATOCRIT: 40.6 % (ref 36.0–46.0)
HEMOGLOBIN: 13.7 g/dL (ref 12.0–16.0)
LARGE UNSTAINED CELLS: 1 % (ref 0–4)
LYMPHOCYTES ABSOLUTE COUNT: 2.7 10*9/L (ref 1.5–5.0)
LYMPHOCYTES RELATIVE PERCENT: 32.5 %
MEAN CORPUSCULAR HEMOGLOBIN CONC: 33.7 g/dL (ref 31.0–37.0)
MEAN CORPUSCULAR HEMOGLOBIN: 28.3 pg (ref 26.0–34.0)
MEAN CORPUSCULAR VOLUME: 84.1 fL (ref 80.0–100.0)
MONOCYTES ABSOLUTE COUNT: 0.5 10*9/L (ref 0.2–0.8)
NEUTROPHILS ABSOLUTE COUNT: 4.9 10*9/L (ref 2.0–7.5)
NEUTROPHILS RELATIVE PERCENT: 58.6 %
PLATELET COUNT: 296 10*9/L (ref 150–440)
RED BLOOD CELL COUNT: 4.83 10*12/L (ref 4.00–5.20)
RED CELL DISTRIBUTION WIDTH: 15.5 % — ABNORMAL HIGH (ref 12.0–15.0)
WBC ADJUSTED: 8.4 10*9/L (ref 4.5–11.0)

## 2017-12-07 LAB — HEPATIC FUNCTION PANEL
ALBUMIN: 4.5 g/dL (ref 3.5–5.0)
ALKALINE PHOSPHATASE: 60 U/L (ref 38–126)
ALT (SGPT): 42 U/L (ref 15–48)
BILIRUBIN DIRECT: 0.2 mg/dL (ref 0.00–0.40)
BILIRUBIN TOTAL: 0.5 mg/dL (ref 0.0–1.2)
PROTEIN TOTAL: 7 g/dL (ref 6.5–8.3)

## 2017-12-07 LAB — RED BLOOD CELL COUNT: Lab: 4.83

## 2017-12-07 LAB — ALT (SGPT): Alanine aminotransferase:CCnc:Pt:Ser/Plas:Qn:: 42

## 2017-12-07 MED ORDER — LORAZEPAM 0.5 MG TABLET: 1 mg | tablet | Freq: Every evening | 0 refills | 0 days | Status: AC

## 2017-12-07 MED ORDER — LORAZEPAM 0.5 MG TABLET
ORAL_TABLET | Freq: Every evening | ORAL | 0 refills | 0.00000 days | Status: CP | PRN
Start: 2017-12-07 — End: 2017-12-07

## 2017-12-07 MED FILL — LORAZEPAM/0.5MG/TABS: LORAZEPAM/0.5MG/TABS | 12 days supply | Qty: 12 | Fill #0

## 2017-12-07 NOTE — Unmapped (Deleted)
Breast Cancer Return Patient Evaluation:     Referring Physician: Cala Bradford, Md  [address]  [facility]  [place], [place] [address]. [phone]    PCP: Stacie Acres WHITE, MD    Assessment/Plan:  I spent at least 40 minutes with this patient more than 50% in counseling. The following issues were discussed:  A 61yo woman with a Left breast pT3pN2a, IDC, G2, ER+(95%)/PR+(30%)/HER2-neg, 5/6 LN positive, +LVI and positive surgical margins s/p BCT with targeted axillary LN bx on 07/03/17. Unfortunately her pathology revealed R1 resection and she requires re-excision with ALND. Completed this on 08/14/17 showing 7 mm of residual tumor, negative margins, 2/23 +LN with larger tumor deposit of 6 mm. Planning for ddAC-T but she has pre-exsisting neuropathy which makes use of weekly paclitaxel challenging so tried q 2 week taxol. However, she had a hypesensitivity reaction  into Taxol infusion and was able to complete at a slower rate.  Accordingly, switched to q3w Abraxane. She is postmenopausal so will plan for an aromatase inhibitor for her HR+ diease. She completed her re-excision/ALND on 08/14/17, now cleared by surgery     I spent at least 40 minutes with this patient more than 50% in counseling. The following issues were discussed:     - Plan: ddTaxol x 4, followed by ddAC x4C   ---> Switching to Abraxance q3w C2-C4   2/2 infusion rxn at C1, f/b ddAC x4   - Labs and clinically appropriate for C4 with abraxane today.   No filgrastim given q3w regimen  - Antiestrogen therapy (an AI) for 5-54yrs  - CT-CAP 07/25/17 NED  - Echo 07/27/17 EF >55%  - Encouraged cryotherapy for G2 CIPN and con't w/Gaba   - Bone pain with Neulasta with 1st Cycle of ddTaxol    Will attempt Neulasta again with pharmacy guidance    On how to manage bone pain. Will use Claritin 3 days     Prior to Neulasta with otc Tylenol/Oxycodone prn    RTC in 3 weeks for C1 ddAC with Neulasta      Pt. seen and discussed with Dr. Avis Epley***  Franklyn Lor MD  Hematology/Oncology Fellow  =      Reason for Visit: A 62 y.o. female with breast cancer   Receiving adjuvant chemotherapy for her pT3pN2a HR+ Her2 negative left sided breast cancer.    10/11/17 She initially presented with self-palpated left breast mass and workup/surgery revealed an 8.3cm tumor HR+/HER2-neg with positive LNs. She is recovering well from surgery.  Drains removed a week ago.She has some pain on the left side of her breast and pain plus numbness down inner side of her left arm. She reports that she has baseline neuropathy in her feet and is followed by a Neuroloogist in [place].  She does not have diabetes and states that the neuropathy is due to structural issues with her feet and high arches and runs in her family. She has no history of heart issues, never had an MI or heart failure. She recently moved to [facility] from [place].  She was laid off from her job and was working part-time but could not qualify for [employer] on the open market so she had to quit her part-time job.  States she would appreciate seeing a Artist to figure out how to pay for all this.    Interval History:  Here with husband.  Ready for C4 of Taxane (Abraxane 2/2 hypersensitivity reaction).  Still  tolerating Eliquis for acute right-sided DVTs on doppler 10/11/17.  Seroma not painful, but uncomfortable.   CIPN G2 feels more weakness/unsteady.   Neuropathy is worse in the balls of the feet.  Still able to perform ADLs.  Has chronic persistent neuropathy 2/2 chronic hammer toes and high arches.   Cannot walk barefoot-neuropathy is located primarily on the balls of both feet.    Neuropathy is better, but has needed to increase her  gabapentin 900mg  qhs, 600mg  AM and 300mg  in the afternoon.   No f/c/n/v/d.  Skin over her legs, arms is itchy without rash.  Lymphedema is stable with compression garments.  Performance status=1    Breast Cancer History:   Oncology History    2018: Left breast cT2 N1a, IDC, G1, +/+/-.        Malignant neoplasm of upper-outer quadrant of left breast in female, estrogen receptor positive (CMS-HCC)    06/07/2017 -  Presenting Symptoms     Left breast palpable UOQ mass         06/07/2017 Interval Scan(s)     Diagnostic [facility]G at [facility]: left breast UOQ mass difficult to measure, but appears to measure at least 5.6 cm.    [place]: left breast ill defined shadowing lesion at 2:00, 6 CFN measuring 3.7 x 2.6 x 2.5 cm.     Single mildly abnormal node in left axilla with constriction of fatty hilum.         06/13/2017 Biopsy     [facility] core biopsy of left breast 2:00, 6 CFN: IDC, G1, ER+(95%), PR+(30%), HER2 negative by FISH.  [facility] core biopsy left axillary LN: +LN metastasis.         06/13/2017 -  Other     Seen in [facility] ED following core biopsy in [facility] for axillary pain with non-expanding 4 cm hematoma noted in left axilla. No intervention required other than symptomatic measures (compression, pain management).         06/20/2017 Tumor Board     [facility] recs: left breast cT2 N1a IDC, G1, receptors pending. Per outside imaging there was a single abnormal LN found by [place] only. Awaiting receptors. Need SAVI into that clipped node. Discussed that she otherwise fits ACOSOG Z0011 criteria with negative clinical exam prior to biopsy and single abnormal LN on axillary [place]. Reasonable to do SLNB and excise biopsied node as well. If receptors favorable, consider surgery first approach, good BCT candidate. If high risk (TN, HER+) may need NACT approach. Briefly discussed ALTERNATE, but consensus was that she will likely be recommended for chemo given age and nodal positivity. Meeting surgery, rad/onc today. Will circle back with med/onc as needed.         07/03/2017 Surgery     Left [facility] localized/US guided partial mastectomy and left sentinel lymph node biopsy with targeted axillary lymph node excision: Invasive mammary carcinoma with mixed ductal and lobular features present in association with in situ carcinoma with mixed ductal and lobular features measuring 8.3 cm with positive inferior and lateral margin. Sentinel lymph node biopsy: 1/1 node positive for metastatic disease measuring 7 mm. No ECE. Palpable axillary lymph nodes: 4/4 lymph nodes with metastatic disease measuring 10 mm.  No ECE.  Additional palpable axillary lymph node 0/1 with isolated tumor cells. SAVI scout in the axilla was defective and there was no localization signal from the SAVI probe.         08/14/2017 Surgery     Left re-excision, ALND w ARM:  Lateral margin: Invasive carcinoma with mixed ductal and lobular features measuring 7mm w LVI, margin negative.   Inferior margin: Multiple foci of lymphovasular invasion present.  No evidence of stromal invasion (final margin negative)  Left ALND: ??Two of twenty-three lymph nodes positive for carcinoma (2/23).  Size of larger tumor deposit: 6 mm.  Extracapsular extension: Absent         09/21/2017 -  Chemotherapy     Started ddTaxol--->ddAC         09/21/2017 Adverse Reaction     First Taxol infused for ~20 minutes resulting in flushing with chest tightness and nausea.  Infusion was stopped and administered.  Symptoms subsided within 20 minutes but she continued to feel some chest tightness with deep inspiration.  Albuterol nebulizer was given.  She then returned to baseline and infusion was restarted at 1/2 rate and titrated up slowly.  She was able to complete infusion without any further events.          10/05/2017 -  Chemotherapy     Switching to Abraxane due to infusion reaction         10/11/2017 Adverse Reaction     Acute DVT Rt interval jugular, brachiocephalic, subclavian vein-now on Eliquis 5 mg qd.          12/05/2017 -  Chemotherapy     Chemotherapy Treatment    Treatment Goal Curative   Line of Treatment [No plan line of treatment]   Plan Name OP BREAST AC (DOSE DENSE)   Start Date 12/07/2017 (Planned)   End Date 01/19/2018 (Planned)   Provider Willeen Niece, MD   Chemotherapy dexamethasone (DECADRON) tablet 12 mg, 12 mg, Oral, Once, 0 of 4 cycles  cyclophosphamide (CYTOXAN) 1,254 mg in sodium chloride (NS) 0.9 % 250 mL IVPB, 600 mg/m2 = 1,254 mg, Intravenous, Once, 0 of 4 cycles  DOXOrubicin (ADRIAMYCIN) syringe 125.4 mg, 60 mg/m2 = 125.4 mg, Intravenous, Once, 0 of 4 cycles                   Past Medical History:  Patient Active Problem List   Diagnosis   ??? Malignant neoplasm of upper-outer quadrant of left breast in female, estrogen receptor positive (CMS-HCC)       Gyn History:   LMP 2012    Surgical History:   Past Surgical History:   Procedure Laterality Date   ??? ANAL FISSURECTOMY     ??? BREAST BIOPSY Left 06/2017    malignant   ??? BREAST BIOPSY Left 06/2017    Lympnode bx-positive   ??? IR INSERT PORT AGE GREATER THAN 5 YRS  09/28/2017    IR INSERT PORT AGE GREATER THAN 5 YRS 09/28/2017 Rush Barer, MD IMG VIR [facility]   ??? PR BX/REMV,LYMPH NODE,DEEP AXILL Left 07/03/2017    Procedure: BX/EXC LYMPH NODE; OPEN, DEEP AXILRY NODE;  Surgeon: Talbert Cage, DO;  Location: ASC OR [facility];  Service: Surgical Oncology   ??? PR INTRAOPERATIVE SENTINEL LYMPH NODE ID W DYE INJECTION Left 07/03/2017    Procedure: INTRAOPERATIVE IDENTIFICATION SENTINEL LYMPH NODE(S) INCLUDE INJECTION NON-RADIOACTIVE DYE, WHEN PERFORMED;  Surgeon: Talbert Cage, DO;  Location: ASC OR [facility];  Service: Surgical Oncology   ??? PR INTRAOPERATIVE SENTINEL LYMPH NODE ID W DYE INJECTION Left 08/14/2017    Procedure: INTRAOPERATIVE IDENTIFICATION SENTINEL LYMPH NODE(S) INCLUDE INJECTION NON-RADIOACTIVE DYE, WHEN PERFORMED;  Surgeon: Talbert Cage, DO;  Location: ASC OR [facility];  Service: Surgical Oncology Breast   ??? PR MASTECTOMY,  PARTIAL Left 07/03/2017    Procedure: MASTECTOMY, savi scout PARTIAL (EG, LUMPECTOMY, TYLECTOMY, QUADRANTECTOMY, SEGMENTECTOMY);  Surgeon: Talbert Cage, DO;  Location: ASC OR [facility];  Service: Surgical Oncology   ??? PR MASTECTOMY, PARTIAL Left 08/14/2017    Procedure: MASTECTOMY, PARTIAL (EG, LUMPECTOMY, TYLECTOMY, QUADRANTECTOMY, SEGMENTECTOMY) Re-excision;  Surgeon: Talbert Cage, DO;  Location: ASC OR [facility];  Service: Surgical Oncology Breast   ??? PR REMOVE ARMPITS LYMPH NODES COMPLT Left 08/14/2017    Procedure: AXILLARY LYMPHADENECTOMY; COMPLETE;  Surgeon: Talbert Cage, DO;  Location: ASC OR [facility];  Service: Surgical Oncology Breast       Medications:  Current Outpatient Medications   Medication Sig Dispense Refill   ??? acetaminophen (TYLENOL) 500 MG tablet Take 1,000 mg by mouth every four (4) hours as needed for pain.     ??? apixaban (ELIQUIS) 5 mg Tab Take 1 tablet (5 mg total) by mouth Two (2) times a day. 60 tablet 5   ??? buPROPion (WELLBUTRIN XL) 300 MG 24 hr tablet Take 300 mg by mouth daily.     ??? dexamethasone (DECADRON) 4 MG tablet Take 2 tablets (8 mg total) by mouth daily. Take on Days 2, 3, and 4. 24 tablet 0   ??? docusate sodium (COLACE) 100 MG capsule Take 100 mg by mouth daily with breakfast.     ??? FLUoxetine (PROZAC) 10 MG tablet Take 10 mg by mouth daily.     ??? GABAPENTIN ORAL Take 900 mg by mouth nightly. Pt taking 1 capsule at noon and 3 capsules at 8PM     ??? loratadine (CLARITIN ORAL) Take by mouth.     ??? ondansetron (ZOFRAN) 4 MG tablet Take 2 tablets (8 mg total) by mouth every eight (8) hours as needed for nausea. 30 tablet 2   ??? ondansetron (ZOFRAN) 8 MG tablet Take 1 tablet (8 mg total) by mouth every twelve (12) hours as needed for nausea. 30 tablet 2   ??? prochlorperazine (COMPAZINE) 10 MG tablet Take 1 tablet (10 mg total) by mouth every six (6) hours as needed (nausea). 30 tablet 2   ??? ranitidine (ZANTAC) 150 MG tablet Take 150 mg by mouth daily.        No current facility-administered medications for this visit.        Allergies:  Allergies   Allergen Reactions   ??? Erythromycin Diarrhea   ??? Penicillins Rash       Personal and Social History:  Social History     Socioeconomic History   ??? Marital status: Married     Spouse name: Not on file   ??? Number of children: Not on file   ??? Years of education: Not on file   ??? Highest education level: Not on file   Occupational History   ??? Not on file   Social Needs   ??? Financial resource strain: Not on file   ??? Food insecurity:     Worry: Not on file     Inability: Not on file   ??? Transportation needs:     Medical: Not on file     Non-medical: Not on file   Tobacco Use   ??? Smoking status: Never Smoker   ??? Smokeless tobacco: Never Used   Substance and Sexual Activity   ??? Alcohol use: No   ??? Drug use: No   ??? Sexual activity: Not on file   Lifestyle   ??? Physical activity:     Days per week: Not on  file     Minutes per session: Not on file   ??? Stress: Not on file   Relationships   ??? Social connections:     Talks on phone: Not on file     Gets together: Not on file     Attends religious service: Not on file     Active member of club or organization: Not on file     Attends meetings of clubs or organizations: Not on file     Relationship status: Not on file   Other Topics Concern   ??? Not on file   Social History Narrative    The patient is married. She lives at home with her husband, Ed.  She has two children       Family History:  Cancer-related family history includes Breast cancer in her paternal aunt. There is no history of Cancer or Ovarian cancer.  indicated that the status of her mother is unknown. She indicated that the status of her father is unknown. She indicated that the status of her sister is unknown. She indicated that the status of her maternal grandmother is unknown. She indicated that the status of her maternal grandfather is unknown. She indicated that the status of her paternal grandmother is unknown. She indicated that the status of her paternal grandfather is unknown. She indicated that the status of her daughter is unknown. She indicated that the status of her neg hx is unknown.      Review of Systems: A complete review of systems was obtained including: Constitutional, Eyes, ENT, Cardiovascular, Respiratory, GI, GU, Musculoskeletal, Skin, Neurological, Psychiatric, Endocrine, Heme/Lymphatic, and Allergic/Immunologic systems. It is negative or non-contributory to the patient???s management except for the following: None    Physical Examination:  Vital Signs: BP 142/71  - Pulse 81  - Temp 36.9 ??C (98.5 ??F) (Oral)  - Ht 171 cm (5' 7.32)  - Wt 90.5 kg (199 lb 8 oz)  - BMI 30.95 kg/m??   General:  Healthy-appearing female in no acute distress..  Cardiovascular:  Heart not clinically enlarged.  No significant murmurs or gallops.  No lower extremity edema.    Respiratory:  Chest clear to percussion and auscultation.   Gastrointestinal:  Abdomen soft without masses and tenderness, no hepatosplenomegaly or other masses.   Musculoskeletal:  No bony pain or tenderness.   Skin and Subcutaneous Tissues:  Negative. No rash, ecchymoses, purpuric lesions noted.  Breasts:  Left breast with well healing scar.  Mild swelling lateral left breast+resolving seroma. Resolving erythema, no fluctuance. Left axilla tender with well healed scar. R breast normal appearing.  Psychiatric:  Alert and oriented.  Mood is normal.  No other symptoms.   Heme/Lymphatic/Immunologic:  No cervical or axillary adenopathy.   Upper Extremity Lymphedema: None.

## 2017-12-07 NOTE — Unmapped (Signed)
Pharmacy: First Cycle Chemotherapy Patient Education    Chemotherapy regimen/agents: dose dense AC  Venous Access: port  Caregivers present for education: sister    Ms. Harpenau is a 62 year old with Left breast pT3pN2a, IDC. Chemotherapy education was provided to the patient by an oncology pharmacist.      Side effects discussed included but were not limited to:   infusion-related reactions, complications associated with myelosuppresion (such as infection/fever, fatigue, and bleeding), nausea/vomiting, diarrhea, constipation, mucositis, taste changes, hair loss, skin/nail changes, cardiac toxicity, changes in color of bodily fluids, hemorrhagic cystitis or fatigue.    Prevention and treatment of bone pain was reviewed.     The [facility] patient handout, antiemetic handout or the Hematology/Oncology Fellow on-call phone number for concerns after hours were given.The patient verbalized understanding of this information.  Medication reconciliation was completed and the medication list was updated in EPIC.      Approximate time spent with patient: 20  Minutes.    Kennon Holter, PharmD, CPP

## 2017-12-07 NOTE — Unmapped (Signed)
It was a pleasure seeing you today.   Continue with the current plan of care.  Follow-up in 2 weeks.    No orders of the defined types were placed in this encounter.      Labs from today if done:  Lab on 12/07/2017   Component Date Value   ??? Albumin 12/07/2017 4.5    ??? Total Protein 12/07/2017 7.0    ??? Total Bilirubin 12/07/2017 0.5    ??? Bilirubin, Direct 12/07/2017 0.20    ??? AST 12/07/2017 32    ??? ALT 12/07/2017 42    ??? Alkaline Phosphatase 12/07/2017 60    ??? WBC 12/07/2017 8.4    ??? RBC 12/07/2017 4.83    ??? HGB 12/07/2017 13.7    ??? HCT 12/07/2017 40.6    ??? MCV 12/07/2017 84.1    ??? MCH 12/07/2017 28.3    ??? MCHC 12/07/2017 33.7    ??? RDW 12/07/2017 15.5*   ??? MPV 12/07/2017 7.9    ??? Platelet 12/07/2017 296    ??? Neutrophils % 12/07/2017 58.6    ??? Lymphocytes % 12/07/2017 32.5    ??? Monocytes % 12/07/2017 6.4    ??? Eosinophils % 12/07/2017 0.6    ??? Basophils % 12/07/2017 0.9    ??? Absolute Neutrophils 12/07/2017 4.9    ??? Absolute Lymphocytes 12/07/2017 2.7    ??? Absolute Monocytes 12/07/2017 0.5    ??? Absolute Eosinophils 12/07/2017 0.1    ??? Absolute Basophils 12/07/2017 0.1    ??? Large Unstained Cells 12/07/2017 1         Future Appointments:  Future Appointments   Date Time Provider Department Center   12/07/2017 10:00 AM ONCDEV CHAIR 56 [id number]   12/11/2017 10:30 AM Sherin Shelah Lewandowsky, PT [facility]   12/13/2017 10:30 AM Sherin Shelah Lewandowsky, PT [facility]   12/20/2017 10:30 AM Sherin Shelah Lewandowsky, PT [facility]   12/21/2017 12:00 PM ADULT ONC LAB [facility]   12/21/2017  1:00 PM Willeen Niece, MD [id number]   12/21/2017  2:00 PM ONCINF CHAIR 19 [id number]   12/24/2017 10:30 AM Sherin Shelah Lewandowsky, PT [facility]   01/03/2018 10:30 AM Sherin Shelah Lewandowsky, PT [facility]   01/10/2018 10:30 AM Sherin Shelah Lewandowsky, PT [facility]   01/11/2018  7:30 AM ADULT ONC LAB [facility]   01/11/2018  8:30 AM Marcy Panning, FNP [id number]   01/11/2018  9:30 AM ONCINF CHAIR 38 [id number]   01/17/2018 10:30 AM Sherin Shelah Lewandowsky, PT [facility]   01/25/2018  7:30 AM ADULT ONC LAB [facility]   01/25/2018  8:30 AM Marcy Panning, FNP [id number]   01/25/2018  9:30 AM ONCDEV CHAIR 52 [id number]   01/31/2018 10:30 AM Sherin Shelah Lewandowsky, PT [facility]       For appointments call [phone]  For new symptoms or health related issues, please call   Nurse Navigator: [name], RN [phone]  On weekends and after hours on weekdays, please call [phone] for the oncology fellow on call.      If you use my[facility]chart, please know that I check messages only twice a week. If your message is urgent, please call or page your nurse navigator or the on-call fellow.

## 2017-12-07 NOTE — Unmapped (Signed)
If you feel like this is an emergency please call 911.  For appointments or questions Monday through Friday 8AM-5PM please call [phone] or Toll Free [phone]. For Medical questions or concerns ask for the Nurse Triage Line.  On Nights, Weekends, and Holidays call [phone] and ask for the Oncologist on Call.  Reasons to call the Nurse Triage Line:  Fever of 100.5 or greater  Nausea and/or vomiting not relived with nausea medicine  Diarrhea or constipation  Severe pain not relieved with usual pain regimen  Shortness of breath  Uncontrolled bleeding  Mental status changes

## 2017-12-07 NOTE — Unmapped (Signed)
0840:  Labs drawn and sent for analysis.  Care provided by  Y Belarus, RN

## 2017-12-07 NOTE — Unmapped (Signed)
Breast Cancer Return Patient Evaluation:     Referring Physician: Cala Bradford, Md  [address]  [facility]  [place], [place] [address]. [phone]    PCP: Stacie Acres WHITE, MD    Assessment/Plan:  I spent at least 40 minutes with this patient more than 50% in counseling. The following issues were discussed:  A 61yo woman with a Left breast pT3pN2a, IDC, G2, ER+(95%)/PR+(30%)/HER2-neg, 5/6 LN positive, +LVI and positive surgical margins s/p BCT with targeted axillary LN bx on 07/03/17. Unfortunately her pathology revealed R1 resection and she requires re-excision with ALND. Completed this on 08/14/17 showing 7 mm of residual tumor, negative margins, 2/23 +LN with larger tumor deposit of 6 mm. Planning for ddAC-T but she has pre-exsisting neuropathy which makes use of weekly paclitaxel challenging so tried q 2 week taxol. However, she had a hypesensitivity reaction  into Taxol infusion and was able to complete at a slower rate.  Accordingly, switched to q3w Abraxane. She is postmenopausal so will plan for an aromatase inhibitor for her HR+ diease. She completed her re-excision/ALND on 08/14/17, now cleared by surgery     I spent at least 40 minutes with this patient more than 50% in counseling. The following issues were discussed:     - Plan: S/P 1 dose of Taxol (2/2 infusion rxn at C1), f/b 3 doses of Abraxane   Ready to commence C1D1 ddAC, labs adequate, EF >55% (07/2017)  - Antiestrogen therapy (AI) for 5-38yrs  - CT-CAP 07/25/17 NED  - Neuropathy-G2 unchanged, con't w/Gaba   - Anxiety-provided more education Re: a/e of ddAC and reviewed anti-emetic regimen  - Bone pain with Neulasta with 1st Cycle of ddTaxol    Will attempt Udenyca again with pharmacy guidance    On how to manage bone pain. Will use Claritin 3 days     Prior to Neulasta with otc Tylenol/Oxycodone prn    RTC in 2 weeks for C2 ddAC with Udenyca    Reason for Visit: A 62 y.o. female with breast cancer   Receiving adjuvant chemotherapy for her pT3pN2a HR+ Her2 negative left sided breast cancer.    10/11/17 She initially presented with self-palpated left breast mass and workup/surgery revealed an 8.3cm tumor HR+/HER2-neg with positive LNs. She is recovering well from surgery.  Drains removed a week ago.She has some pain on the left side of her breast and pain plus numbness down inner side of her left arm. She reports that she has baseline neuropathy in her feet and is followed by a Neuroloogist in [place].  She does not have diabetes and states that the neuropathy is due to structural issues with her feet and high arches and runs in her family. She has no history of heart issues, never had an MI or heart failure. She recently moved to [facility] from [place].  She was laid off from her job and was working part-time but could not qualify for [employer] on the open market so she had to quit her part-time job.  States she would appreciate seeing a Artist to figure out how to pay for all this.    Interval History:  Here with friend, Eunice Blase.  Ready for C1 ddAC.  Seroma not painful, with mild discomfort.  CIPN G2 feels more weakness/unsteady.   Neuropathy about the same as last visit, worse in the balls of the feet.  Still able to perform ADLs.  Has chronic persistent neuropathy 2/2 chronic hammer toes  and high arches.   Cannot walk barefoot-neuropathy is located primarily on the balls of both feet.    Neuropathy is better, currently on gabapentin   No f/c/n/v/d.  Lymphedema is stable with compression garments.  Thinks she had a panic attack 2 weeks ago, which occurred while ready materials related to her chemotherapy.  Performance status=1    Breast Cancer History:   Oncology History    2018: Left breast cT2 N1a, IDC, G1, +/+/-.        Malignant neoplasm of upper-outer quadrant of left breast in female, estrogen receptor positive (CMS-HCC)    06/07/2017 -  Presenting Symptoms     Left breast palpable UOQ mass 06/07/2017 Interval Scan(s)     Diagnostic MMG at [facility]: left breast UOQ mass difficult to measure, but appears to measure at least 5.6 cm.    [place]: left breast ill defined shadowing lesion at 2:00, 6 CFN measuring 3.7 x 2.6 x 2.5 cm.     Single mildly abnormal node in left axilla with constriction of fatty hilum.         06/13/2017 Biopsy     [facility] core biopsy of left breast 2:00, 6 CFN: IDC, G1, ER+(95%), PR+(30%), HER2 negative by FISH.  [facility] core biopsy left axillary LN: +LN metastasis.         06/13/2017 -  Other     Seen in [facility] ED following core biopsy in [facility] for axillary pain with non-expanding 4 cm hematoma noted in left axilla. No intervention required other than symptomatic measures (compression, pain management).         06/20/2017 Tumor Board     [facility] recs: left breast cT2 N1a IDC, G1, receptors pending. Per outside imaging there was a single abnormal LN found by [place] only. Awaiting receptors. Need SAVI into that clipped node. Discussed that she otherwise fits ACOSOG Z0011 criteria with negative clinical exam prior to biopsy and single abnormal LN on axillary [place]. Reasonable to do SLNB and excise biopsied node as well. If receptors favorable, consider surgery first approach, good BCT candidate. If high risk (TN, HER+) may need NACT approach. Briefly discussed ALTERNATE, but consensus was that she will likely be recommended for chemo given age and nodal positivity. Meeting surgery, rad/onc today. Will circle back with med/onc as needed.         07/03/2017 Surgery     Left [facility] localized/US guided partial mastectomy and left sentinel lymph node biopsy with targeted axillary lymph node excision: Invasive mammary carcinoma with mixed ductal and lobular features present in association with in situ carcinoma with mixed ductal and lobular features measuring 8.3 cm with positive inferior and lateral margin. Sentinel lymph node biopsy: 1/1 node positive for metastatic disease measuring 7 mm. No ECE. Palpable axillary lymph nodes: 4/4 lymph nodes with metastatic disease measuring 10 mm.  No ECE.  Additional palpable axillary lymph node 0/1 with isolated tumor cells. SAVI scout in the axilla was defective and there was no localization signal from the SAVI probe.         08/14/2017 Surgery     Left re-excision, ALND w ARM: Lateral margin: Invasive carcinoma with mixed ductal and lobular features measuring 7mm w LVI, margin negative.   Inferior margin: Multiple foci of lymphovasular invasion present.  No evidence of stromal invasion (final margin negative)  Left ALND: ??Two of twenty-three lymph nodes positive for carcinoma (2/23).  Size of larger tumor deposit: 6 mm.  Extracapsular extension: Absent  09/21/2017 -  Chemotherapy     Started ddTaxol--->ddAC         09/21/2017 Adverse Reaction     First Taxol infused for ~20 minutes resulting in flushing with chest tightness and nausea.  Infusion was stopped and administered.  Symptoms subsided within 20 minutes but she continued to feel some chest tightness with deep inspiration.  Albuterol nebulizer was given.  She then returned to baseline and infusion was restarted at 1/2 rate and titrated up slowly.  She was able to complete infusion without any further events.          10/05/2017 -  Chemotherapy     Switching to Abraxane due to infusion reaction         10/11/2017 Adverse Reaction     Acute DVT Rt interval jugular, brachiocephalic, subclavian vein-now on Eliquis 5 mg qd.          12/05/2017 -  Chemotherapy     Chemotherapy Treatment    Treatment Goal Curative   Line of Treatment [No plan line of treatment]   Plan Name OP BREAST AC (DOSE DENSE)   Start Date 12/07/2017 (Planned)   End Date 01/19/2018 (Planned)   Provider Willeen Niece, MD   Chemotherapy dexamethasone (DECADRON) tablet 12 mg, 12 mg, Oral, Once, 0 of 4 cycles  cyclophosphamide (CYTOXAN) 1,254 mg in sodium chloride (NS) 0.9 % 250 mL IVPB, 600 mg/m2 = 1,254 mg, Intravenous, Once, 0 of 4 cycles  DOXOrubicin (ADRIAMYCIN) syringe 125.4 mg, 60 mg/m2 = 125.4 mg, Intravenous, Once, 0 of 4 cycles                   Past Medical History:  Patient Active Problem List   Diagnosis   ??? Malignant neoplasm of upper-outer quadrant of left breast in female, estrogen receptor positive (CMS-HCC)       Gyn History:   LMP 2012    Surgical History:   Past Surgical History:   Procedure Laterality Date   ??? ANAL FISSURECTOMY     ??? BREAST BIOPSY Left 06/2017    malignant   ??? BREAST BIOPSY Left 06/2017    Lympnode bx-positive   ??? IR INSERT PORT AGE GREATER THAN 5 YRS  09/28/2017    IR INSERT PORT AGE GREATER THAN 5 YRS 09/28/2017 Rush Barer, MD IMG VIR [facility]   ??? PR BX/REMV,LYMPH NODE,DEEP AXILL Left 07/03/2017    Procedure: BX/EXC LYMPH NODE; OPEN, DEEP AXILRY NODE;  Surgeon: Talbert Cage, DO;  Location: ASC OR [facility];  Service: Surgical Oncology   ??? PR INTRAOPERATIVE SENTINEL LYMPH NODE ID W DYE INJECTION Left 07/03/2017    Procedure: INTRAOPERATIVE IDENTIFICATION SENTINEL LYMPH NODE(S) INCLUDE INJECTION NON-RADIOACTIVE DYE, WHEN PERFORMED;  Surgeon: Talbert Cage, DO;  Location: ASC OR [facility];  Service: Surgical Oncology   ??? PR INTRAOPERATIVE SENTINEL LYMPH NODE ID W DYE INJECTION Left 08/14/2017    Procedure: INTRAOPERATIVE IDENTIFICATION SENTINEL LYMPH NODE(S) INCLUDE INJECTION NON-RADIOACTIVE DYE, WHEN PERFORMED;  Surgeon: Talbert Cage, DO;  Location: ASC OR [facility];  Service: Surgical Oncology Breast   ??? PR MASTECTOMY, PARTIAL Left 07/03/2017    Procedure: MASTECTOMY, savi scout PARTIAL (EG, LUMPECTOMY, TYLECTOMY, QUADRANTECTOMY, SEGMENTECTOMY);  Surgeon: Talbert Cage, DO;  Location: ASC OR [facility];  Service: Surgical Oncology   ??? PR MASTECTOMY, PARTIAL Left 08/14/2017    Procedure: MASTECTOMY, PARTIAL (EG, LUMPECTOMY, TYLECTOMY, QUADRANTECTOMY, SEGMENTECTOMY) Re-excision;  Surgeon: Talbert Cage, DO;  Location: ASC OR [facility];  Service: Surgical Oncology Breast ???  PR REMOVE ARMPITS LYMPH NODES COMPLT Left 08/14/2017    Procedure: AXILLARY LYMPHADENECTOMY; COMPLETE;  Surgeon: Talbert Cage, DO;  Location: ASC [facility];  Service: Surgical Oncology Breast       Medications:  Current Outpatient Medications   Medication Sig Dispense Refill   ??? acetaminophen (TYLENOL) 500 MG tablet Take 1,000 mg by mouth every four (4) hours as needed for pain.     ??? apixaban (ELIQUIS) 5 mg Tab Take 1 tablet (5 mg total) by mouth Two (2) times a day. 60 tablet 5   ??? buPROPion (WELLBUTRIN XL) 300 MG 24 hr tablet Take 300 mg by mouth daily.     ??? dexamethasone (DECADRON) 4 MG tablet Take 2 tablets (8 mg total) by mouth daily. Take on Days 2, 3, and 4. 24 tablet 0   ??? docusate sodium (COLACE) 100 MG capsule Take 100 mg by mouth daily with breakfast.     ??? FLUoxetine (PROZAC) 10 MG tablet Take 10 mg by mouth daily.     ??? GABAPENTIN ORAL Take 900 mg by mouth nightly. Pt taking 1 capsule at noon and 3 capsules at 8PM     ??? loratadine (CLARITIN ORAL) Take by mouth.     ??? ondansetron (ZOFRAN) 4 MG tablet Take 2 tablets (8 mg total) by mouth every eight (8) hours as needed for nausea. 30 tablet 2   ??? ondansetron (ZOFRAN) 8 MG tablet Take 1 tablet (8 mg total) by mouth every twelve (12) hours as needed for nausea. 30 tablet 2   ??? prochlorperazine (COMPAZINE) 10 MG tablet Take 1 tablet (10 mg total) by mouth every six (6) hours as needed (nausea). 30 tablet 2   ??? ranitidine (ZANTAC) 150 MG tablet Take 150 mg by mouth daily.        No current facility-administered medications for this visit.        Allergies:  Allergies   Allergen Reactions   ??? Erythromycin Diarrhea   ??? Penicillins Rash       Personal and Social History:  Social History     Socioeconomic History   ??? Marital status: Married     Spouse name: Not on file   ??? Number of children: Not on file   ??? Years of education: Not on file   ??? Highest education level: Not on file   Occupational History   ??? Not on file   Social Needs   ??? Financial resource strain: Not on file   ??? Food insecurity:     Worry: Not on file     Inability: Not on file   ??? Transportation needs:     Medical: Not on file     Non-medical: Not on file   Tobacco Use   ??? Smoking status: Never Smoker   ??? Smokeless tobacco: Never Used   Substance and Sexual Activity   ??? Alcohol use: No   ??? Drug use: No   ??? Sexual activity: Not on file   Lifestyle   ??? Physical activity:     Days per week: Not on file     Minutes per session: Not on file   ??? Stress: Not on file   Relationships   ??? Social connections:     Talks on phone: Not on file     Gets together: Not on file     Attends religious service: Not on file     Active member of club or organization: Not on file  Attends meetings of clubs or organizations: Not on file     Relationship status: Not on file   Other Topics Concern   ??? Not on file   Social History Narrative    The patient is married. She lives at home with her husband, Ed.  She has two children       Family History:  Cancer-related family history includes Breast cancer in her paternal aunt. There is no history of Cancer or Ovarian cancer.  indicated that the status of her mother is unknown. She indicated that the status of her father is unknown. She indicated that the status of her sister is unknown. She indicated that the status of her maternal grandmother is unknown. She indicated that the status of her maternal grandfather is unknown. She indicated that the status of her paternal grandmother is unknown. She indicated that the status of her paternal grandfather is unknown. She indicated that the status of her daughter is unknown. She indicated that the status of her neg hx is unknown.      Review of Systems: A complete review of systems was obtained including: Constitutional, Eyes, ENT, Cardiovascular, Respiratory, GI, GU, Musculoskeletal, Skin, Neurological, Psychiatric, Endocrine, Heme/Lymphatic, and Allergic/Immunologic systems. It is negative or non-contributory to the patient???s management except for the following: None    Physical Examination:  Vital Signs: BP 142/71  - Pulse 81  - Temp 36.9 ??C (98.5 ??F) (Oral)  - Ht 171 cm (5' 7.32)  - Wt 90.5 kg (199 lb 8 oz)  - BMI 30.95 kg/m??   General:  Healthy-appearing female in no acute distress..  Cardiovascular:  Heart not clinically enlarged.  No significant murmurs or gallops.  No lower extremity edema.    Respiratory:  Chest clear to percussion and auscultation.   Gastrointestinal:  Abdomen soft without masses and tenderness, no hepatosplenomegaly or other masses.   Musculoskeletal:  No bony pain or tenderness.   Skin and Subcutaneous Tissues:  Negative. No rash, ecchymoses, purpuric lesions noted.  Breasts:  Left breast with well healing scar.  Mild swelling lateral left breast+resolving seroma. Resolving erythema, no fluctuance. Left axilla tender with well healed scar. R breast normal appearing.  Psychiatric:  Alert and oriented.  Mood is normal.  No other symptoms.   Heme/Lymphatic/Immunologic:  No cervical or axillary adenopathy.   Upper Extremity Lymphedema: None.

## 2017-12-07 NOTE — Unmapped (Signed)
Patient arrived to chair 56.  No complaints noted.  Access of PAC intact with blood return.     Patient completed and tolerated treatment.  AVS given and patient discharged to home.

## 2017-12-07 NOTE — Unmapped (Signed)
Addended by: Johnette Abraham V on: 12/07/2017 11:06 AM     Modules accepted: Orders

## 2017-12-09 ENCOUNTER — Encounter: Admit: 2017-12-09 | Discharge: 2017-12-10 | Payer: PRIVATE HEALTH INSURANCE

## 2017-12-09 DIAGNOSIS — Z170 Estrogen receptor positive status [ER+]: Secondary | ICD-10-CM

## 2017-12-09 DIAGNOSIS — C50412 Malignant neoplasm of upper-outer quadrant of left female breast: Principal | ICD-10-CM

## 2017-12-09 NOTE — Unmapped (Signed)
0160 Pt here for udenyca injection per MD order. Pt tolerated injection well, voicing no complaints. Pt left ambulatory at 0915.

## 2017-12-11 DIAGNOSIS — C50412 Malignant neoplasm of upper-outer quadrant of left female breast: Principal | ICD-10-CM

## 2017-12-11 DIAGNOSIS — Z170 Estrogen receptor positive status [ER+]: Secondary | ICD-10-CM

## 2017-12-11 DIAGNOSIS — I890 Lymphedema, not elsewhere classified: Secondary | ICD-10-CM

## 2017-12-11 NOTE — Unmapped (Signed)
Marland Kitchen  OUTPATIENT PHYSICALTHERAPY  Upper Quadrant Lymphedema   Daily Note  Date:12/11/2017    Patient Name: Erika Cabrera  Date of Birth:Sep 03, 1955  Session #:3  Diagnosis:   Encounter Diagnoses   Name Primary?   ??? Malignant neoplasm of upper-outer quadrant of left breast in female, estrogen receptor positive (CMS-HCC) Yes   ??? Lymphedema      Onset:  Referring Provider: Ophelia Shoulder*    Initial Evaluation and POC certification : 10/25/17-01/23/18      Contraindications:  none    Precautions/Red Flags:  history of cancer      ASSESSMENT/Reason for Referral:    62 y.o. female presents with Stage  II  lymphedema secondary  In chest and under axilla . Today's session focused on MLD  cording stretch which pt seems to tolerate well.  Plan to retake measurements next session.  Fatigue noted from chemo, however no neuropathic fever.      Recommendations for treatment/garments:  1) Patient will obtain proper garments to address swelling and improve tissue integrity. Garment recommendations for   upper extremity:20-30 mm hg Elvarex soft garment ( flat knit) plus a night time jovi pak with cover sleeve. Post lumpectomy jovi pak and bellisse bra.  Arm sleeve for rep movement, airplane travel, exercise.   2) Pt will perform self MLD 2x daily.   3) Pt to perform daily skin checks/ skin care to check for signs and symptoms of infection.  4) Pt will be seen clinically for  MLD to decrease swelling and improve skin integrity      Patient requires skilled Physical Therapy services  for the following problem list and secondary functional limitations:    Problem List:   Lack of knowledge of lymphedema and self care of this condition, Increased risk of infection, Lack of appropriate compression garment and Uncontrolled lymphedema swelling    Secondary Functional Limitations:  Decreased  ROM of arm and Scar/Myofascial  immobility    Barriers to learning:  no        Patient Goals: Decrease swelling, obtain a compression garment and decrease stiffness    Physical Therapy Goals:   In 3-4 sessions    Pt will have knowledge of proper skin care and lymphedema risk precautions to reduce risk of infection.  Patient will be able to properly demonstrate self-manual lymphatic drainage x1 in clinic to assist in management of lymphedema swelling  Pt. will independently don/doff and tolerate compression garment or compression bandages x1 in clinic  to ensure independent management of lymphedema.  Patient will demonstrate proper posture  to minimize pain and tightness in the affected quadrant.  MLD will be performed clinically to improve lymphangiomotoricity, thereby rerouting lymphatic flow to accomodate cancer treatment effects on the lymphatic system.    In 1-6 months:    Pt will be independent with all self care related to lymphedema.      Prognosis for goal achievement:   Good due to good motivation.        PLAN:    mechanics, taping, and pump/ equipment  recommendations     Planned frequency and duration  of treatment:    2x week for 2 weeks then 1x week for 6 weeks.            SUBJECTIVE:  Patient???s communication preference: verbal, written, visual, prn       Pateint reports has been practicing her balance. reoports fatigue and  out of it since chemo  Friday. Reports has been tapering  off steroids recently.     Onset of swelling:  Since drains removed.      Location of pain: No pain  Current Pain:  0/10     History of Present Illness/ Pt reports:   Pt reports recurrent Seroma since drains removed . Has been drained multiple times 1s time 360 ml, second time 310,  35 mil, 3rd time 55 ml, 4th time 48 ml.  Pt reports has not noticed swelling in extremity.  Some ROM  Deficits/ tightness noted.         Home environment:       Lives with family. Has two kids in college   Sleeping position: bed     Pt has caregiver available, capable and willing to help:yes     Occupation/ Activities/Recreation:        Occupational History   ??? Not on file       Prior Functional Status:   Independent    Current Functional Limitations: The patient reports the following limitations based on verbal description and a Quick DASH score of  which indicates 6% impairment of the upper extremity:  arm, shoulder, and hand pain      Sexual Dysfunction or Bowel or Bladder changes : none    History of infections: no      Previous Lymphedema Treatment Type: none    Current management: n/a           Medical hx/conditions/surgical procedures, medications, allergies:    Reviewed      Recent Procedures/Tests/Findings:  Oncology History    2018: Left breast cT2 N1a, IDC, G1, +/+/-.        Malignant neoplasm of upper-outer quadrant of left breast in female, estrogen receptor positive (CMS-HCC)    06/07/2017 -  Presenting Symptoms     Left breast palpable UOQ mass         06/07/2017 Interval Scan(s)     Diagnostic MMG at [facility]: left breast UOQ mass difficult to measure, but appears to measure at least 5.6 cm.    [place]: left breast ill defined shadowing lesion at 2:00, 6 CFN measuring 3.7 x 2.6 x 2.5 cm.     Single mildly abnormal node in left axilla with constriction of fatty hilum.         06/13/2017 Biopsy     [facility] core biopsy of left breast 2:00, 6 CFN: IDC, G1, ER+(95%), PR+(30%), HER2 negative by FISH.  [facility] core biopsy left axillary LN: +LN metastasis.         06/13/2017 -  Other     Seen in [facility] ED following core biopsy in [facility] for axillary pain with non-expanding 4 cm hematoma noted in left axilla. No intervention required other than symptomatic measures (compression, pain management).         06/20/2017 Tumor Board     [facility] recs: left breast cT2 N1a IDC, G1, receptors pending. Per outside imaging there was a single abnormal LN found by [place] only. Awaiting receptors. Need SAVI into that clipped node. Discussed that she otherwise fits ACOSOG Z0011 criteria with negative clinical exam prior to biopsy and single abnormal LN on axillary [place]. Reasonable to do SLNB and excise biopsied node as well. If receptors favorable, consider surgery first approach, good BCT candidate. If high risk (TN, HER+) may need NACT approach. Briefly discussed ALTERNATE, but consensus was that she will likely be recommended for chemo given age and nodal positivity. Meeting surgery, rad/onc today. Will circle back with med/onc  as needed.         07/03/2017 Surgery     Left [facility] localized/US guided partial mastectomy and left sentinel lymph node biopsy with targeted axillary lymph node excision: Invasive mammary carcinoma with mixed ductal and lobular features present in association with in situ carcinoma with mixed ductal and lobular features measuring 8.3 cm with positive inferior and lateral margin. Sentinel lymph node biopsy: 1/1 node positive for metastatic disease measuring 7 mm. No ECE. Palpable axillary lymph nodes: 4/4 lymph nodes with metastatic disease measuring 10 mm.  No ECE.  Additional palpable axillary lymph node 0/1 with isolated tumor cells. SAVI scout in the axilla was defective and there was no localization signal from the SAVI probe.         08/14/2017 Surgery     Left re-excision, ALND w ARM: Lateral margin: Invasive carcinoma with mixed ductal and lobular features measuring 7mm w LVI, margin negative.   Inferior margin: Multiple foci of lymphovasular invasion present.  No evidence of stromal invasion (final margin negative)  Left ALND: ??Two of twenty-three lymph nodes positive for carcinoma (2/23).  Size of larger tumor deposit: 6 mm.  Extracapsular extension: Absent         09/21/2017 -  Chemotherapy     Started ddTaxol--->ddAC         09/21/2017 Adverse Reaction     First Taxol infused for ~20 minutes resulting in flushing with chest tightness and nausea.  Infusion was stopped and administered.  Symptoms subsided within 20 minutes but she continued to feel some chest tightness with deep inspiration.  Albuterol nebulizer was given.  She then returned to baseline and infusion was restarted at 1/2 rate and titrated up slowly.  She was able to complete infusion without any further events.          10/05/2017 -  Chemotherapy     Switching to Abraxane due to infusion reaction         10/11/2017 Adverse Reaction     Acute DVT Rt interval jugular, brachiocephalic, subclavian vein-now on Eliquis 5 mg qd.          12/05/2017 -  Chemotherapy     Chemotherapy Treatment    Treatment Goal Curative   Line of Treatment [No plan line of treatment]   Plan Name OP BREAST AC (DOSE DENSE)   Start Date 12/07/2017   End Date 01/19/2018 (Planned)   Provider Willeen Niece, MD   Chemotherapy dexamethasone (DECADRON) tablet 12 mg, 12 mg, Oral, Once, 1 of 4 cycles  Administration: 12 mg (12/07/2017)  cyclophosphamide (CYTOXAN) 1,254 mg in sodium chloride (NS) 0.9 % 250 mL IVPB, 600 mg/m2 = 1,254 mg, Intravenous, Once, 1 of 4 cycles  Administration: 1,254 mg (12/07/2017)  DOXOrubicin (ADRIAMYCIN) syringe 125.4 mg, 60 mg/m2 = 125.4 mg, Intravenous, Once, 1 of 4 cycles  Administration: 125.4 mg (12/07/2017)                 OBJECTIVE:      Girth Measurements:      DYNAMIC GAIT INDEX    Grading: record the lowest category that applies.   1.Gait level surface: 3  Instructions: Walk at your normal speed from here to the next mark (20???).     (3) Normal: walks 20???, no assistive devices, good speed, no evidence for imbalance, normal gait pattern.   (2) Mild impairment: walks 20???, uses assistive devices, slower speed, mild gait deviations.   (1) Moderate impairment: walks 20???, slow speed,  abnormal gait patters, evidence for imbalance.   (0) Severe impairment: cannot walk 20??? without assistance, severe gait deviations or imbalance.     2.Change in gait speed: 3  Instructions: Begin walking at your normal pace (for 5???), when I tell you ???go???, walk as fast as you can (for 5???). When I tell you ???slow???, walk as slowly as you can (for 5???).     (3) Normal: Able to smoothly change walking speed without loss of balance or gait deviation. Shows significant difference in walking speeds between normal, fast and slow paces.   (2) Mild impairment: Is able to change speed but demonstrates mild gait deviations, or no gait deviations but unable to achieve a significant change in velocity, or uses as assistive device.   (1) Moderate impairment: Makes only minor adjustments to walking speed, or accomplishes a change in speed with significant gait deviations, or changes speed but loses balance but is able to recover and continue walking.   (0) Severe impairment: Cannot change speeds, or loss balance and has to reach for a wall or be caught.     3.Gait with horizontal head turns: 1  Instructions: Begin walking at your normal pace. When I tell you to ???look right???, keep walking straight, but turn your head to the right. Keep looking to the right until I tell you ???look left???, then keep walking straight and turn your head to the left. Keep your head to the left until I tell you, ???look straight???, then keep walking straight, but return your head to the centre.     (3) Normal: Performs head turns smoothly with no change in gait.   (2) Mild impairment: Performs head turns smoothly with slight change in gait velocity, i.e. minor disruption to smooth gait path or uses walking aid.   (1) Moderate impairment: Performs head turns with moderate change in gait velocity, slows down, staggers, but recovers, can continue to walk.   (0) Severe impairment: Performs task with severe disruption of gait, i.e. staggers outside 15??? path, loses balance, stops, reaches for wall.     4.Gait with vertical head turns:3  Instructions: Begin walking at your normal pace. When I tell you to ???look up???, keep walking straight, but tip your head and look up. Keep looking up until I tell you, ???look down???. Then keep walking straight and turn your head down. Keep looking down until I tell you, ??? look straight???, then keep walking straight, but return your head to the centre.     (3) Normal: Performs head turns smoothly with no change in gait.   (2) Mild impairment: Performs head turns smoothly with slight change in gait velocity, i.e. minor disruption to smooth gait path or uses walking aid.   (1) Moderate impairment: Performs head turns with moderate change in gait velocity, slows down, staggers, but recovers, can continue to walk.   (0) Severe impairment: Performs task with severe disruption of gait, i.e. staggers outside 15??? path, loses balance, stops, reaches for wall.     5.Gait and pivot turn: 1  Instructions: Begin walking at your normal pace. When I tell you, ???turn and stop???, turn as quickly as you can to face the opposite direction and stop.     (3) Normal: Pivot turns safely within 3 seconds and stops quickly with no loss of balance.   (2) Mild impairment: pivot turns safely in >3 seconds and stops with no loss of balance.   (1) Moderate impairment: Turns slowly, requires verbal cueing, requires  several small steps to catch balance following turn and stop.   (0) Severe impairment: Cannot turn safely, requires assistance to turn and stop.     6.Step over obstacle: 2  Instructions: Begin walking at your normal speed. When you come to the shoebox, step over it, not around it, and keep walking.     (3) Normal: Is able to step over box without changing gait speed; no evidence for imbalance.   (2) Mild impairment: Is able to step over shoe box, but must slow down and adjust steps to clear box safely.   (1) Moderate impairment: Is able to step over box but must stop, then step over. May require verbal cueing.   (0) Severe impairment: Cannot perform without assistance.     7.Step around obstacles: 3  Instructions: Begin walking at normal speed. When you come to the first cone (about 6??? away), walk around the right side of it. When you some to the second cone (6??? past first cone), walk around it to the left.     (3) Normal: Is able to walk safely around cones safely without changing gait speed; no evidence of imbalance.   (2) Mild impairment: Is able to step around both cones, but must slow down and adjust steps to clear cones.   (1) Moderate impairment: Is able to clear cones but must significantly slow speed to accomplish task, or requires verbal cueing.   (0) Severe impairment: Unable to clear cones, walks into one or both cones, or requires physical assistance.     8.Steps: 2  Instructions: Walk up these stairs as you would at home.(i.e. using a rail if necessary. At the top, turn around and walk down.     (3) Normal: Alternating feet, no rail.   (2) Mild impairment: Alternating feet, must use rail.   (1) Moderate impairment: Two feet to a stair, must use rail.   (0) Severe impairment: Cannot do safely.     TOTAL SCORE: 18/24  F:\[employer]\[facility] website\physiotherapy section\Dynamic Gait Index v.doc       Location of swelling:  L chest and axilla.     Skin: WFL except for the following:  increased skin thickness    Stemmer???s sign:no.      Sensation Intact to light touch:   Decreased sensation under axilla, around scar, and posterior lateral upper arm     Incision: healing appropriately for post op status, adhered to underlying tissue    Axillary Web Syndrome::yes     Posture/Observations:  Forward head, rounded shoulders        Range of Motion/Flexibilty:   UE WFL except  End range overhead activity.     Strength/MMT:   UE WFL     Total Treatment Time: 5 min        Treatment Rendered:      Manual therapy  X 55 min    - LUQ MLD.    - PROM R Shoulder all planes   - cording stretch            Today's Charges (noted here with $$):          Therapeutic Interventions  $$ Manual Therapy [mins]: 55               Data from the patient???s history and examination indicates the presence of 1-2 personal factors and/or comorbidities that will impact the plan of care for the current problem, including cancer history Examination of body systems includes 4 or more  body  structures, functions, activity limitations and/or participation restrictions including integumentary system, lymphatic system, self care and scar mobility. The assessed clinical presentation is evolving based on  changing lymphatic status. These factors result in the need for moderate clinical decision making.    Equipment provided/recommended:   written HEP    Communication/consultation with other professionals:  Email to DME supplier [employer] re: garment needs        I attest that I have reviewed the above information.  Signed: Kirke Corin, PT   12/11/2017 2:35 PM

## 2017-12-14 NOTE — Unmapped (Signed)
-----   Message from Marcy Panning, FNP sent at 12/13/2017  5:28 PM EDT -----  Regarding: C2 ddAC 5/17  Need orders signed for C2 ddaC  Struggling with bone pain and Udenyca-hopefully Claritin 3 days prior is helping.    Marisue Ivan

## 2017-12-20 DIAGNOSIS — I890 Lymphedema, not elsewhere classified: Secondary | ICD-10-CM

## 2017-12-20 DIAGNOSIS — Z170 Estrogen receptor positive status [ER+]: Secondary | ICD-10-CM

## 2017-12-20 DIAGNOSIS — C50412 Malignant neoplasm of upper-outer quadrant of left female breast: Principal | ICD-10-CM

## 2017-12-20 NOTE — Unmapped (Signed)
Marland Kitchen  OUTPATIENT PHYSICALTHERAPY  Upper Quadrant Lymphedema   Daily Note  Date:12/20/2017    Patient Name: Erika Cabrera  Date of Birth:[birth year]  Session #:5  Diagnosis:   Encounter Diagnoses   Name Primary?   ??? Malignant neoplasm of upper-outer quadrant of left breast in female, estrogen receptor positive (CMS-HCC) Yes   ??? Lymphedema      Onset:  Referring Provider: Ophelia Shoulder*    Initial Evaluation and POC certification : 10/25/17-01/23/18      Contraindications:  none    Precautions/Red Flags:  history of cancer      ASSESSMENT/Reason for Referral:    62 y.o. female presents with Stage  II  lymphedema secondary  In chest and under axilla .  Cording and arm ROM sig improved since eval. Still limited by neuropathy and fatigue from chemo. Attempted  Lumbar traction to see if it even slight helped with some of the neuropathic symptoms in leg, however pt without improvement in these symptoms with traction.      Patient requires skilled Physical Therapy services  for the following problem list and secondary functional limitations:    Problem List:   Lack of knowledge of lymphedema and self care of this condition, Increased risk of infection, Lack of appropriate compression garment and Uncontrolled lymphedema swelling    Secondary Functional Limitations:  Decreased  ROM of arm and Scar/Myofascial  immobility    Barriers to learning:  no        Patient Goals: Decrease swelling, obtain a compression garment and decrease stiffness    Physical Therapy Goals:   In 3-4 sessions    Pt will have knowledge of proper skin care and lymphedema risk precautions to reduce risk of infection.  Patient will be able to properly demonstrate self-manual lymphatic drainage x1 in clinic to assist in management of lymphedema swelling  Pt. will independently don/doff and tolerate compression garment or compression bandages x1 in clinic  to ensure independent management of lymphedema.  Patient will demonstrate proper posture  to minimize pain and tightness in the affected quadrant.  MLD will be performed clinically to improve lymphangiomotoricity, thereby rerouting lymphatic flow to accomodate cancer treatment effects on the lymphatic system.    In 1-6 months:    Pt will be independent with all self care related to lymphedema.      Prognosis for goal achievement:   Good due to good motivation.        PLAN:    mechanics, taping, and pump/ equipment  recommendations     Planned frequency and duration  of treatment:    2x week for 2 weeks then 1x week for 6 weeks.            SUBJECTIVE:  Patient???s communication preference: verbal, written, visual, prn     Pt reports that her fatigue levels are getting better and that she is starting to feel more like herself.     Onset of swelling:  Since drains removed.      Location of pain: No pain  Current Pain:  0/10     History of Present Illness/ Pt reports:   Pt reports recurrent Seroma since drains removed . Has been drained multiple times 1s time 360 ml, second time 310,  35 mil, 3rd time 55 ml, 4th time 48 ml.  Pt reports has not noticed swelling in extremity.  Some ROM  Deficits/ tightness noted.       Home environment:  Lives with family. Has two kids in college   Sleeping position: bed     Pt has caregiver available, capable and willing to help:yes     Occupation/ Activities/Recreation:        Occupational History   ??? Not on file       Prior Functional Status:   Independent    Current Functional Limitations: The patient reports the following limitations based on verbal description and a Quick DASH score of  which indicates 6% impairment of the upper extremity:  arm, shoulder, and hand pain    Sexual Dysfunction or Bowel or Bladder changes : none    History of infections: no    Previous Lymphedema Treatment Type: none    Current management: n/a       Medical hx/conditions/surgical procedures, medications, allergies:    Reviewed      Recent Procedures/Tests/Findings:  Oncology History    2018: Left breast cT2 N1a, IDC, G1, +/+/-.        Malignant neoplasm of upper-outer quadrant of left breast in female, estrogen receptor positive (CMS-HCC)    06/07/2017 -  Presenting Symptoms     Left breast palpable UOQ mass         06/07/2017 Interval Scan(s)     Diagnostic [facility] at [facility]: left breast UOQ mass difficult to measure, but appears to measure at least 5.6 cm.    [place]: left breast ill defined shadowing lesion at 2:00, 6 CFN measuring 3.7 x 2.6 x 2.5 cm.     Single mildly abnormal node in left axilla with constriction of fatty hilum.         06/13/2017 Biopsy     [facility] core biopsy of left breast 2:00, 6 CFN: IDC, G1, ER+(95%), PR+(30%), HER2 negative by FISH.  [facility] core biopsy left axillary LN: +LN metastasis.         06/13/2017 -  Other     Seen in [facility] ED following core biopsy in [facility] for axillary pain with non-expanding 4 cm hematoma noted in left axilla. No intervention required other than symptomatic measures (compression, pain management).         06/20/2017 Tumor Board     [facility] recs: left breast cT2 N1a IDC, G1, receptors pending. Per outside imaging there was a single abnormal LN found by [place] only. Awaiting receptors. Need SAVI into that clipped node. Discussed that she otherwise fits ACOSOG Z0011 criteria with negative clinical exam prior to biopsy and single abnormal LN on axillary [place]. Reasonable to do SLNB and excise biopsied node as well. If receptors favorable, consider surgery first approach, good BCT candidate. If high risk (TN, HER+) may need NACT approach. Briefly discussed ALTERNATE, but consensus was that she will likely be recommended for chemo given age and nodal positivity. Meeting surgery, rad/onc today. Will circle back with med/onc as needed.         07/03/2017 Surgery     Left [facility] localized/US guided partial mastectomy and left sentinel lymph node biopsy with targeted axillary lymph node excision: Invasive mammary carcinoma with mixed ductal and lobular features present in association with in situ carcinoma with mixed ductal and lobular features measuring 8.3 cm with positive inferior and lateral margin. Sentinel lymph node biopsy: 1/1 node positive for metastatic disease measuring 7 mm. No ECE. Palpable axillary lymph nodes: 4/4 lymph nodes with metastatic disease measuring 10 mm.  No ECE.  Additional palpable axillary lymph node 0/1 with isolated tumor cells. SAVI scout in the  axilla was defective and there was no localization signal from the SAVI probe.         08/14/2017 Surgery     Left re-excision, ALND w ARM: Lateral margin: Invasive carcinoma with mixed ductal and lobular features measuring 7mm w LVI, margin negative.   Inferior margin: Multiple foci of lymphovasular invasion present.  No evidence of stromal invasion (final margin negative)  Left ALND: ??Two of twenty-three lymph nodes positive for carcinoma (2/23).  Size of larger tumor deposit: 6 mm.  Extracapsular extension: Absent         09/21/2017 -  Chemotherapy     Started ddTaxol--->ddAC         09/21/2017 Adverse Reaction     First Taxol infused for ~20 minutes resulting in flushing with chest tightness and nausea.  Infusion was stopped and administered.  Symptoms subsided within 20 minutes but she continued to feel some chest tightness with deep inspiration.  Albuterol nebulizer was given.  She then returned to baseline and infusion was restarted at 1/2 rate and titrated up slowly.  She was able to complete infusion without any further events.          10/05/2017 -  Chemotherapy     Switching to Abraxane due to infusion reaction         10/11/2017 Adverse Reaction     Acute DVT Rt interval jugular, brachiocephalic, subclavian vein-now on Eliquis 5 mg qd.          12/05/2017 -  Chemotherapy     Chemotherapy Treatment    Treatment Goal Curative   Line of Treatment [No plan line of treatment]   Plan Name OP BREAST AC (DOSE DENSE)   Start Date 12/07/2017   End Date 01/19/2018 (Planned)   Provider Willeen Niece, MD   Chemotherapy dexamethasone (DECADRON) tablet 12 mg, 12 mg, Oral, Once, 2 of 4 cycles  Administration: 12 mg (12/07/2017), 12 mg (12/21/2017)  cyclophosphamide (CYTOXAN) 1,254 mg in sodium chloride (NS) 0.9 % 250 mL IVPB, 600 mg/m2 = 1,254 mg, Intravenous, Once, 2 of 4 cycles  Administration: 1,254 mg (12/07/2017), 1,254 mg (12/21/2017)  DOXOrubicin (ADRIAMYCIN) syringe 125.4 mg, 60 mg/m2 = 125.4 mg, Intravenous, Once, 2 of 4 cycles  Administration: 125.4 mg (12/07/2017), 125.4 mg (12/21/2017)                 OBJECTIVE:      Girth Measurements:      DYNAMIC GAIT INDEX    Grading: record the lowest category that applies.   1.Gait level surface: 3  Instructions: Walk at your normal speed from here to the next mark (20???).     (3) Normal: walks 20???, no assistive devices, good speed, no evidence for imbalance, normal gait pattern.   (2) Mild impairment: walks 20???, uses assistive devices, slower speed, mild gait deviations.   (1) Moderate impairment: walks 20???, slow speed, abnormal gait patters, evidence for imbalance.   (0) Severe impairment: cannot walk 20??? without assistance, severe gait deviations or imbalance.     2.Change in gait speed: 3  Instructions: Begin walking at your normal pace (for 5???), when I tell you ???go???, walk as fast as you can (for 5???). When I tell you ???slow???, walk as slowly as you can (for 5???).     (3) Normal: Able to smoothly change walking speed without loss of balance or gait deviation. Shows significant difference in walking speeds between normal, fast and slow paces.   (2) Mild impairment: Is  able to change speed but demonstrates mild gait deviations, or no gait deviations but unable to achieve a significant change in velocity, or uses as assistive device.   (1) Moderate impairment: Makes only minor adjustments to walking speed, or accomplishes a change in speed with significant gait deviations, or changes speed but loses balance but is able to recover and continue walking.   (0) Severe impairment: Cannot change speeds, or loss balance and has to reach for a wall or be caught.     3.Gait with horizontal head turns: 1  Instructions: Begin walking at your normal pace. When I tell you to ???look right???, keep walking straight, but turn your head to the right. Keep looking to the right until I tell you ???look left???, then keep walking straight and turn your head to the left. Keep your head to the left until I tell you, ???look straight???, then keep walking straight, but return your head to the centre.     (3) Normal: Performs head turns smoothly with no change in gait.   (2) Mild impairment: Performs head turns smoothly with slight change in gait velocity, i.e. minor disruption to smooth gait path or uses walking aid.   (1) Moderate impairment: Performs head turns with moderate change in gait velocity, slows down, staggers, but recovers, can continue to walk.   (0) Severe impairment: Performs task with severe disruption of gait, i.e. staggers outside 15??? path, loses balance, stops, reaches for wall.     4.Gait with vertical head turns:3  Instructions: Begin walking at your normal pace. When I tell you to ???look up???, keep walking straight, but tip your head and look up. Keep looking up until I tell you, ???look down???. Then keep walking straight and turn your head down. Keep looking down until I tell you, ??? look straight???, then keep walking straight, but return your head to the centre.     (3) Normal: Performs head turns smoothly with no change in gait.   (2) Mild impairment: Performs head turns smoothly with slight change in gait velocity, i.e. minor disruption to smooth gait path or uses walking aid.   (1) Moderate impairment: Performs head turns with moderate change in gait velocity, slows down, staggers, but recovers, can continue to walk.   (0) Severe impairment: Performs task with severe disruption of gait, i.e. staggers outside 15??? path, loses balance, stops, reaches for wall.     5.Gait and pivot turn: 1 Instructions: Begin walking at your normal pace. When I tell you, ???turn and stop???, turn as quickly as you can to face the opposite direction and stop.     (3) Normal: Pivot turns safely within 3 seconds and stops quickly with no loss of balance.   (2) Mild impairment: pivot turns safely in >3 seconds and stops with no loss of balance.   (1) Moderate impairment: Turns slowly, requires verbal cueing, requires several small steps to catch balance following turn and stop.   (0) Severe impairment: Cannot turn safely, requires assistance to turn and stop.     6.Step over obstacle: 2  Instructions: Begin walking at your normal speed. When you come to the shoebox, step over it, not around it, and keep walking.     (3) Normal: Is able to step over box without changing gait speed; no evidence for imbalance.   (2) Mild impairment: Is able to step over shoe box, but must slow down and adjust steps to clear box safely.   (1) Moderate impairment: Is  able to step over box but must stop, then step over. May require verbal cueing.   (0) Severe impairment: Cannot perform without assistance.     7.Step around obstacles: 3  Instructions: Begin walking at normal speed. When you come to the first cone (about 6??? away), walk around the right side of it. When you some to the second cone (6??? past first cone), walk around it to the left.     (3) Normal: Is able to walk safely around cones safely without changing gait speed; no evidence of imbalance.   (2) Mild impairment: Is able to step around both cones, but must slow down and adjust steps to clear cones.   (1) Moderate impairment: Is able to clear cones but must significantly slow speed to accomplish task, or requires verbal cueing.   (0) Severe impairment: Unable to clear cones, walks into one or both cones, or requires physical assistance.     8.Steps: 2  Instructions: Walk up these stairs as you would at home.(i.e. using a rail if necessary. At the top, turn around and walk down. (3) Normal: Alternating feet, no rail.   (2) Mild impairment: Alternating feet, must use rail.   (1) Moderate impairment: Two feet to a stair, must use rail.   (0) Severe impairment: Cannot do safely.     TOTAL SCORE: 18/24  F:\[employer]\[facility] website\physiotherapy section\Dynamic Gait Index v.doc       Location of swelling:  L chest and axilla.     Skin: WFL except for the following:  increased skin thickness    Stemmer???s sign:no.      Sensation Intact to light touch:   Decreased sensation under axilla, around scar, and posterior lateral upper arm     Incision: healing appropriately for post op status, adhered to underlying tissue    Axillary Web Syndrome::yes     Posture/Observations:  Forward head, rounded shoulders        Range of Motion/Flexibilty:   UE WFL except  End range overhead activity.     Strength/MMT:   UE WFL     Total Treatment Time: 5 min        Treatment Rendered:      Manual therapy  X 55 min    - LUQ MLD.    - cording stretch    - manual lumbar traction with bekts.            Today's Charges (noted here with $$):                          Data from the patient???s history and examination indicates the presence of 1-2 personal factors and/or comorbidities that will impact the plan of care for the current problem, including cancer history Examination of body systems includes 4 or more  body structures, functions, activity limitations and/or participation restrictions including integumentary system, lymphatic system, self care and scar mobility. The assessed clinical presentation is evolving based on  changing lymphatic status. These factors result in the need for moderate clinical decision making.    Equipment provided/recommended:   written HEP    Communication/consultation with other professionals:  Email to DME supplier [employer] re: garment needs        I attest that I have reviewed the above information.  Signed: Kirke Corin, PT   12/20/2017 10:55 PM

## 2017-12-21 ENCOUNTER — Encounter
Admit: 2017-12-21 | Discharge: 2017-12-22 | Payer: PRIVATE HEALTH INSURANCE | Attending: Medical Oncology | Primary: Medical Oncology

## 2017-12-21 ENCOUNTER — Encounter: Admit: 2017-12-21 | Discharge: 2017-12-22 | Payer: PRIVATE HEALTH INSURANCE

## 2017-12-21 DIAGNOSIS — Z170 Estrogen receptor positive status [ER+]: Secondary | ICD-10-CM

## 2017-12-21 DIAGNOSIS — C50412 Malignant neoplasm of upper-outer quadrant of left female breast: Principal | ICD-10-CM

## 2017-12-21 LAB — HEPATIC FUNCTION PANEL
ALBUMIN: 4.2 g/dL (ref 3.5–5.0)
ALT (SGPT): 42 U/L (ref 15–48)
AST (SGOT): 28 U/L (ref 14–38)
BILIRUBIN DIRECT: <0.1 mg/dL (ref 0.00–0.40)
BILIRUBIN TOTAL: 0.4 mg/dL (ref 0.0–1.2)

## 2017-12-21 LAB — TOXIC GRANULATION

## 2017-12-21 LAB — CBC W/ AUTO DIFF
BASOPHILS ABSOLUTE COUNT: 0.1 10*9/L (ref 0.0–0.1)
BASOPHILS RELATIVE PERCENT: 0.8 %
EOSINOPHILS ABSOLUTE COUNT: 0 10*9/L (ref 0.0–0.4)
EOSINOPHILS RELATIVE PERCENT: 0.1 %
HEMATOCRIT: 38 % (ref 36.0–46.0)
HEMOGLOBIN: 12.6 g/dL (ref 12.0–16.0)
LARGE UNSTAINED CELLS: 2 % (ref 0–4)
LYMPHOCYTES ABSOLUTE COUNT: 1.9 10*9/L (ref 1.5–5.0)
LYMPHOCYTES RELATIVE PERCENT: 22.3 %
MEAN CORPUSCULAR HEMOGLOBIN CONC: 33.2 g/dL (ref 31.0–37.0)
MEAN CORPUSCULAR HEMOGLOBIN: 28.1 pg (ref 26.0–34.0)
MEAN CORPUSCULAR VOLUME: 84.7 fL (ref 80.0–100.0)
MEAN PLATELET VOLUME: 7.8 fL (ref 7.0–10.0)
MONOCYTES ABSOLUTE COUNT: 0.4 10*9/L (ref 0.2–0.8)
NEUTROPHILS ABSOLUTE COUNT: 6 10*9/L (ref 2.0–7.5)
NEUTROPHILS RELATIVE PERCENT: 70.2 %
PLATELET COUNT: 184 10*9/L (ref 150–440)
RED BLOOD CELL COUNT: 4.49 10*12/L (ref 4.00–5.20)
RED CELL DISTRIBUTION WIDTH: 16.5 % — ABNORMAL HIGH (ref 12.0–15.0)
WBC ADJUSTED: 8.5 10*9/L (ref 4.5–11.0)

## 2017-12-21 LAB — LYMPHOCYTES RELATIVE PERCENT: Lab: 22.3

## 2017-12-21 LAB — ALT (SGPT): Alanine aminotransferase:CCnc:Pt:Ser/Plas:Qn:: 42

## 2017-12-21 MED ORDER — LORAZEPAM 0.5 MG TABLET: tablet | 0 refills | 0 days

## 2017-12-21 MED ORDER — LORAZEPAM 0.5 MG TABLET
ORAL_TABLET | Freq: Every evening | ORAL | 0 refills | 0.00000 days | Status: CP | PRN
Start: 2017-12-21 — End: 2017-12-21

## 2017-12-21 MED FILL — LORAZEPAM/0.5MG/TABS: LORAZEPAM/0.5MG/TABS | 30 days supply | Qty: 30 | Fill #0

## 2017-12-21 NOTE — Unmapped (Signed)
Breast Cancer Return Patient Evaluation:     Referring Physician: Referred Self  No address on file.     PCP: Stacie Acres WHITE, MD    Assessment/Plan:  I spent at least 40 minutes with this patient more than 50% in counseling. The following issues were discussed:  A 62yo woman with a Left breast pT3pN2a, IDC, G2, ER+(95%)/PR+(30%)/HER2-neg, 5/6 LN positive, +LVI and positive surgical margins s/p BCT with targeted axillary LN bx on 07/03/17. Unfortunately her pathology revealed residual disease and she requires re-excision with ALND. Completed this on 08/14/17 showing 7 mm of residual tumor, negative margins, 2/23 +LN with larger tumor deposit of 6 mm. Planning for ddAC-T but she has pre-exsisting neuropathy which makes use of weekly paclitaxel challenging so tried q 2 week taxol. In her, we started with her taxane because of wound healing and inability to get a port. However, she had a hypesensitivity reaction  into Taxol infusion and was able to complete at a slower rate.  Accordingly, switched to q3w Abraxane. She is postmenopausal so will plan for an aromatase inhibitor for her HR+ diease.   Now receiving AC  I spent at least 40 minutes with this patient more than 50% in counseling. The following issues were discussed:     - Plan: Notes that claritin helped the bone pain.   Has nausea and fatigue but manageable.    Will refer to pain managementmt because of neuropathy    RTC in 2 weeks for C3 ddAC with Udenyca    Reason for Visit: A 62 y.o. female with breast cancer   Receiving adjuvant chemotherapy for her pT3pN2a HR+ Her2 negative left sided breast cancer.    10/11/17 She initially presented with self-palpated left breast mass and workup/surgery revealed an 8.3cm tumor HR+/HER2-neg with positive LNs. She is recovering well from surgery.  Drains removed a week ago.She has some pain on the left side of her breast and pain plus numbness down inner side of her left arm. She reports that she has baseline neuropathy in her feet and is followed by a Neuroloogist in [place].  She does not have diabetes and states that the neuropathy is due to structural issues with her feet and high arches and runs in her family. She has no history of heart issues, never had an MI or heart failure. She recently moved to [facility] from [place].  She was laid off from her job and was working part-time but could not qualify for [employer] on the open market so she had to quit her part-time job.  States she would appreciate seeing a Artist to figure out how to pay for all this.    Interval History:  Above.  Doing okay.  Claritin improved the bone pain.  She does have some nausea and fatigue but it is manageable.  She notes that her neuropathy has really persisted and perhaps worsened.  She no longer sees her neurologist in [place] and would like a referral to a pain management here      Breast Cancer History:   Oncology History    2018: Left breast cT2 N1a, IDC, G1, +/+/-.        Malignant neoplasm of upper-outer quadrant of left breast in female, estrogen receptor positive (CMS-HCC)    06/07/2017 -  Presenting Symptoms     Left breast palpable UOQ mass         06/07/2017 Interval Scan(s)     Diagnostic [facility] at [facility]: left  breast UOQ mass difficult to measure, but appears to measure at least 5.6 cm.    [place]: left breast ill defined shadowing lesion at 2:00, 6 CFN measuring 3.7 x 2.6 x 2.5 cm.     Single mildly abnormal node in left axilla with constriction of fatty hilum.         06/13/2017 Biopsy     [facility] core biopsy of left breast 2:00, 6 CFN: IDC, G1, ER+(95%), PR+(30%), HER2 negative by FISH.  [facility] core biopsy left axillary LN: +LN metastasis.         06/13/2017 -  Other     Seen in [facility] ED following core biopsy in [facility] for axillary pain with non-expanding 4 cm hematoma noted in left axilla. No intervention required other than symptomatic measures (compression, pain management). 06/20/2017 Tumor Board     [facility] recs: left breast cT2 N1a IDC, G1, receptors pending. Per outside imaging there was a single abnormal LN found by [place] only. Awaiting receptors. Need SAVI into that clipped node. Discussed that she otherwise fits ACOSOG Z0011 criteria with negative clinical exam prior to biopsy and single abnormal LN on axillary [place]. Reasonable to do SLNB and excise biopsied node as well. If receptors favorable, consider surgery first approach, good BCT candidate. If high risk (TN, HER+) may need NACT approach. Briefly discussed ALTERNATE, but consensus was that she will likely be recommended for chemo given age and nodal positivity. Meeting surgery, rad/onc today. Will circle back with med/onc as needed.         07/03/2017 Surgery     Left [facility] localized/US guided partial mastectomy and left sentinel lymph node biopsy with targeted axillary lymph node excision: Invasive mammary carcinoma with mixed ductal and lobular features present in association with in situ carcinoma with mixed ductal and lobular features measuring 8.3 cm with positive inferior and lateral margin. Sentinel lymph node biopsy: 1/1 node positive for metastatic disease measuring 7 mm. No ECE. Palpable axillary lymph nodes: 4/4 lymph nodes with metastatic disease measuring 10 mm.  No ECE.  Additional palpable axillary lymph node 0/1 with isolated tumor cells. SAVI scout in the axilla was defective and there was no localization signal from the SAVI probe.         08/14/2017 Surgery     Left re-excision, ALND w ARM: Lateral margin: Invasive carcinoma with mixed ductal and lobular features measuring 7mm w LVI, margin negative.   Inferior margin: Multiple foci of lymphovasular invasion present.  No evidence of stromal invasion (final margin negative)  Left ALND: ??Two of twenty-three lymph nodes positive for carcinoma (2/23).  Size of larger tumor deposit: 6 mm.  Extracapsular extension: Absent         09/21/2017 -  Chemotherapy     Started ddTaxol--->ddAC         09/21/2017 Adverse Reaction     First Taxol infused for ~20 minutes resulting in flushing with chest tightness and nausea.  Infusion was stopped and administered.  Symptoms subsided within 20 minutes but she continued to feel some chest tightness with deep inspiration.  Albuterol nebulizer was given.  She then returned to baseline and infusion was restarted at 1/2 rate and titrated up slowly.  She was able to complete infusion without any further events.          10/05/2017 -  Chemotherapy     Switching to Abraxane due to infusion reaction         10/11/2017 Adverse Reaction  Acute DVT Rt interval jugular, brachiocephalic, subclavian vein-now on Eliquis 5 mg qd.          12/05/2017 -  Chemotherapy     Chemotherapy Treatment    Treatment Goal Curative   Line of Treatment [No plan line of treatment]   Plan Name OP BREAST AC (DOSE DENSE)   Start Date 12/07/2017   End Date 01/19/2018 (Planned)   Provider Willeen Niece, MD   Chemotherapy dexamethasone (DECADRON) tablet 12 mg, 12 mg, Oral, Once, 1 of 4 cycles  Administration: 12 mg (12/07/2017)  cyclophosphamide (CYTOXAN) 1,254 mg in sodium chloride (NS) 0.9 % 250 mL IVPB, 600 mg/m2 = 1,254 mg, Intravenous, Once, 1 of 4 cycles  Administration: 1,254 mg (12/07/2017)  DOXOrubicin (ADRIAMYCIN) syringe 125.4 mg, 60 mg/m2 = 125.4 mg, Intravenous, Once, 1 of 4 cycles  Administration: 125.4 mg (12/07/2017)                   Past Medical History:  Patient Active Problem List   Diagnosis   ??? Malignant neoplasm of upper-outer quadrant of left breast in female, estrogen receptor positive (CMS-HCC)       Gyn History:   LMP 2012    Surgical History:   Past Surgical History:   Procedure Laterality Date   ??? ANAL FISSURECTOMY     ??? BREAST BIOPSY Left 06/2017    malignant   ??? BREAST BIOPSY Left 06/2017    Lympnode bx-positive   ??? IR INSERT PORT AGE GREATER THAN 5 YRS  09/28/2017    IR INSERT PORT AGE GREATER THAN 5 YRS 09/28/2017 Rush Barer, MD IMG VIR [facility]   ??? PR BX/REMV,LYMPH NODE,DEEP AXILL Left 07/03/2017    Procedure: BX/EXC LYMPH NODE; OPEN, DEEP AXILRY NODE;  Surgeon: Talbert Cage, DO;  Location: ASC OR [facility];  Service: Surgical Oncology   ??? PR INTRAOPERATIVE SENTINEL LYMPH NODE ID W DYE INJECTION Left 07/03/2017    Procedure: INTRAOPERATIVE IDENTIFICATION SENTINEL LYMPH NODE(S) INCLUDE INJECTION NON-RADIOACTIVE DYE, WHEN PERFORMED;  Surgeon: Talbert Cage, DO;  Location: ASC OR [facility];  Service: Surgical Oncology   ??? PR INTRAOPERATIVE SENTINEL LYMPH NODE ID W DYE INJECTION Left 08/14/2017    Procedure: INTRAOPERATIVE IDENTIFICATION SENTINEL LYMPH NODE(S) INCLUDE INJECTION NON-RADIOACTIVE DYE, WHEN PERFORMED;  Surgeon: Talbert Cage, DO;  Location: ASC OR [facility];  Service: Surgical Oncology Breast   ??? PR MASTECTOMY, PARTIAL Left 07/03/2017    Procedure: MASTECTOMY, savi scout PARTIAL (EG, LUMPECTOMY, TYLECTOMY, QUADRANTECTOMY, SEGMENTECTOMY);  Surgeon: Talbert Cage, DO;  Location: ASC OR [facility];  Service: Surgical Oncology   ??? PR MASTECTOMY, PARTIAL Left 08/14/2017    Procedure: MASTECTOMY, PARTIAL (EG, LUMPECTOMY, TYLECTOMY, QUADRANTECTOMY, SEGMENTECTOMY) Re-excision;  Surgeon: Talbert Cage, DO;  Location: ASC OR [facility];  Service: Surgical Oncology Breast   ??? PR REMOVE ARMPITS LYMPH NODES COMPLT Left 08/14/2017    Procedure: AXILLARY LYMPHADENECTOMY; COMPLETE;  Surgeon: Talbert Cage, DO;  Location: ASC [facility];  Service: Surgical Oncology Breast       Medications:  Current Outpatient Medications   Medication Sig Dispense Refill   ??? acetaminophen (TYLENOL) 500 MG tablet Take 1,000 mg by mouth every four (4) hours as needed for pain.     ??? apixaban (ELIQUIS) 5 mg Tab Take 1 tablet (5 mg total) by mouth Two (2) times a day. 60 tablet 5   ??? buPROPion (WELLBUTRIN XL) 300 MG 24 hr tablet Take 300 mg by mouth daily.     ??? dexamethasone (  DECADRON) 4 MG tablet Take 2 tablets (8 mg total) by mouth daily. Take on Days 2, 3, and 4. 24 tablet 0   ??? docusate sodium (COLACE) 100 MG capsule Take 100 mg by mouth daily with breakfast.     ??? FLUoxetine (PROZAC) 10 MG tablet Take 10 mg by mouth daily.     ??? GABAPENTIN ORAL Take 900 mg by mouth nightly. Pt taking 1 capsule at noon and 3 capsules at 8PM     ??? loratadine (CLARITIN ORAL) Take by mouth.     ??? LORazepam (ATIVAN) 0.5 MG tablet Take 0.5 mg by mouth nightly as needed (take for difficulty sleeping on nights taking dexamethasone).     ??? ondansetron (ZOFRAN) 4 MG tablet Take 2 tablets (8 mg total) by mouth every eight (8) hours as needed for nausea. 30 tablet 2   ??? ondansetron (ZOFRAN) 8 MG tablet Take 1 tablet (8 mg total) by mouth every twelve (12) hours as needed for nausea. 30 tablet 2   ??? polyethylene glycol (MIRALAX) 17 gram packet Take 17 g by mouth daily.     ??? prochlorperazine (COMPAZINE) 10 MG tablet Take 1 tablet (10 mg total) by mouth every six (6) hours as needed (nausea). 30 tablet 2   ??? ranitidine (ZANTAC) 150 MG tablet Take 150 mg by mouth daily.        No current facility-administered medications for this visit.      Facility-Administered Medications Ordered in Other Visits   Medication Dose Route Frequency Provider Last Rate Last Dose   ??? heparin, porcine (PF) 100 unit/mL injection 500 Units  500 Units Intravenous Q30 Min PRN Marcy Panning, [place]   500 Units at 12/21/17 1225   ??? sodium chloride (NS) 0.9 % flush 10 mL  10 mL Intravenous Q30 Min PRN Marcy Panning, FNP   10 mL at 12/21/17 1226       Allergies:  Allergies   Allergen Reactions   ??? Erythromycin Diarrhea   ??? Penicillins Rash       Personal and Social History:  The patient is married. She lives at home with her husband, Ed.  She has two children    Family History:  Cancer-related family history includes Breast cancer in her paternal aunt. There is no history of Cancer or Ovarian cancer.  indicated that the status of her mother is unknown. She indicated that the status of her father is unknown. She indicated that the status of her sister is unknown. She indicated that the status of her maternal grandmother is unknown. She indicated that the status of her maternal grandfather is unknown. She indicated that the status of her paternal grandmother is unknown. She indicated that the status of her paternal grandfather is unknown. She indicated that the status of her daughter is unknown. She indicated that the status of her neg hx is unknown.      Review of Systems: A complete review of systems was obtained including: Constitutional, Eyes, ENT, Cardiovascular, Respiratory, GI, GU, Musculoskeletal, Skin, Neurological, Psychiatric, Endocrine, Heme/Lymphatic, and Allergic/Immunologic systems. It is negative or non-contributory to the patient???s management except for the following: None    Physical Examination:  Vital Signs: BP 123/67  - Pulse 80  - Temp 36.8 ??C (98.2 ??F) (Oral)  - Resp 16  - Ht 171 cm (5' 7.32)  - Wt 88.7 kg (195 lb 9.6 oz)  - SpO2 98%  - BMI 30.34 kg/m??   General:  Healthy-appearing female in no  acute distress..  Cardiovascular:  Heart not clinically enlarged.  No significant murmurs or gallops.  No lower extremity edema.    Respiratory:  Chest clear to percussion and auscultation.   Gastrointestinal:  Abdomen soft without masses and tenderness, no hepatosplenomegaly or other masses.   Musculoskeletal:  No bony pain or tenderness.   Skin and Subcutaneous Tissues:  Negative. No rash, ecchymoses, purpuric lesions noted.  Breasts:  Left breast with well healing scar.  Mild swelling lateral left breast+resolving seroma. Resolving erythema, no fluctuance. Left axilla tender with well healed scar. R breast normal appearing.  Psychiatric:  Alert and oriented.  Mood is normal.  No other symptoms.   Heme/Lymphatic/Immunologic:  No cervical or axillary adenopathy.   Upper Extremity Lymphedema: None.

## 2017-12-21 NOTE — Unmapped (Signed)
Pt presents in NAD, no complaints, pain free, no negative changes since last visit. Port accessed prior to arrival to infusion center.+blood return. During pre hydration, pt had multiple questions r/t ,medications, process, what to expect. Pt understood all replies. Pt encouraged to feel free to ask staff anything when here or to e-mail navigator.     1619 During time of premeds patient mentioned had prescription in pharmacy. Heather with [facility] contacted and was able to bring medicine to chairside.    1638 Port de-accessed after 500 units of heparin instilled pt tolerated infusion without difficulty. NAD, no questions, no complaints at time of discharge, verbalized understanding of d/c instructions. Pt left infusion center ambulatory, steady gait.

## 2017-12-21 NOTE — Unmapped (Signed)
1220:  Labs drawn and sent for analysis.  Care provided by  Rica Records, RN

## 2017-12-21 NOTE — Unmapped (Addendum)
Results for MEGA, KINKADE (MRN [id number]) as of 12/21/2017 16:18   Ref. Range 12/21/2017 12:38   WBC Latest Ref Range: 4.5 - 11.0 10*9/L 8.5   RBC Latest Ref Range: 4.00 - 5.20 10*12/L 4.49   HGB Latest Ref Range: 12.0 - 16.0 g/dL 82.9   HCT Latest Ref Range: 36.0 - 46.0 % 38.0   MCV Latest Ref Range: 80.0 - 100.0 fL 84.7   MCH Latest Ref Range: 26.0 - 34.0 pg 28.1   MCHC Latest Ref Range: 31.0 - 37.0 g/dL 56.2   RDW Latest Ref Range: 12.0 - 15.0 % 16.5 (H)   MPV Latest Ref Range: 7.0 - 10.0 fL 7.8   Platelet Latest Ref Range: 150 - 440 10*9/L 184   Neutrophils % Latest Units: % 70.2   Lymphocytes % Latest Units: % 22.3   Monocytes % Latest Units: % 4.8   Eosinophils % Latest Units: % 0.1   Basophils % Latest Units: % 0.8   Absolute Neutrophils Latest Ref Range: 2.0 - 7.5 10*9/L 6.0   Absolute Lymphocytes Latest Ref Range: 1.5 - 5.0 10*9/L 1.9   Absolute Monocytes  Latest Ref Range: 0.2 - 0.8 10*9/L 0.4   Absolute Eosinophils Latest Ref Range: 0.0 - 0.4 10*9/L 0.0   Absolute Basophils  Latest Ref Range: 0.0 - 0.1 10*9/L 0.1   Anisocytosis Latest Ref Range: Not Present  Slight (A)   Smear Review Latest Ref Range: Undefined  See Comment (A)   Large Unstained Cells Latest Ref Range: 0 - 4 % 2   Toxic Granulation Latest Ref Range: Not Present  Present (A)   Albumin Latest Ref Range: 3.5 - 5.0 g/dL 4.2   Total Protein Latest Ref Range: 6.5 - 8.3 g/dL 6.5   Total Bilirubin Latest Ref Range: 0.0 - 1.2 mg/dL 0.4   Bilirubin, Direct Latest Ref Range: 0.00 - 0.40 mg/dL <1.30   AST Latest Ref Range: 14 - 38 U/L 28   ALT Latest Ref Range: 15 - 48 U/L 42   Alkaline Phosphatase Latest Ref Range: 38 - 126 U/L 77       If you feel like you're having an emergency please call 911.  For appointments or questions Monday through Friday 8AM-5PM please call [phone] or Toll Free [phone]. For Medical questions or concerns ask for the Nurse Triage Line.  On Nights, Weekends, and Holidays call [phone] and ask for the Oncologist on Call.  Reasons to call the Nurse Triage Line:  Fever of 100.5 or greater  Nausea and/or vomiting not relived with nausea medicine  Diarrhea or constipation  Severe pain not relieved with usual pain regimen  Shortness of breath  Uncontrolled bleeding  Mental status changes

## 2017-12-23 ENCOUNTER — Encounter: Admit: 2017-12-23 | Discharge: 2017-12-24 | Payer: PRIVATE HEALTH INSURANCE

## 2017-12-23 DIAGNOSIS — Z170 Estrogen receptor positive status [ER+]: Secondary | ICD-10-CM

## 2017-12-23 DIAGNOSIS — C50412 Malignant neoplasm of upper-outer quadrant of left female breast: Principal | ICD-10-CM

## 2017-12-23 NOTE — Unmapped (Signed)
9811: Patient present for Neulasta injection, no acute concerns reported.    0900: Injection administered. AVS declined. Patient discharged home alert, oriented and in stable condition.

## 2017-12-24 ENCOUNTER — Ambulatory Visit: Payer: BLUE CROSS/BLUE SHIELD | Admitting: Nurse Practitioner

## 2017-12-24 ENCOUNTER — Encounter
Admit: 2017-12-24 | Discharge: 2018-01-22 | Payer: PRIVATE HEALTH INSURANCE | Attending: Rehabilitative and Restorative Service Providers" | Primary: Rehabilitative and Restorative Service Providers"

## 2017-12-24 DIAGNOSIS — C50412 Malignant neoplasm of upper-outer quadrant of left female breast: Principal | ICD-10-CM

## 2017-12-24 DIAGNOSIS — I890 Lymphedema, not elsewhere classified: Secondary | ICD-10-CM

## 2017-12-24 DIAGNOSIS — Z170 Estrogen receptor positive status [ER+]: Secondary | ICD-10-CM

## 2017-12-24 NOTE — Unmapped (Signed)
Marland Kitchen  OUTPATIENT PHYSICALTHERAPY  Upper Quadrant Lymphedema   Daily Note  Date:12/24/2017    Patient Name: Erika Cabrera  Date of Birth:1955-09-27  Session #:5  Diagnosis:   Encounter Diagnoses   Name Primary?   ??? Malignant neoplasm of upper-outer quadrant of left breast in female, estrogen receptor positive (CMS-HCC) Yes   ??? Lymphedema      Onset:  Referring Provider: Ophelia Shoulder*    Initial Evaluation and POC certification : 10/25/17-01/23/18      Contraindications:  none    Precautions/Red Flags:  history of cancer      ASSESSMENT/Reason for Referral:    62 y.o. female presents with Stage  II  lymphedema secondary  In chest and under axilla. Pt unable to tolerate lying down flat  Due to SOB symptoms. Pt with some swelling medial/ superior to port site; messaged Dr Avis Epley regarding this to check for potential clot.     Patient requires skilled Physical Therapy services  for the following problem list and secondary functional limitations:    Problem List:   Lack of knowledge of lymphedema and self care of this condition, Increased risk of infection, Lack of appropriate compression garment and Uncontrolled lymphedema swelling    Secondary Functional Limitations:  Decreased  ROM of arm and Scar/Myofascial  immobility    Barriers to learning:  no        Patient Goals: Decrease swelling, obtain a compression garment and decrease stiffness    Physical Therapy Goals:   In 3-4 sessions    Pt will have knowledge of proper skin care and lymphedema risk precautions to reduce risk of infection.  Patient will be able to properly demonstrate self-manual lymphatic drainage x1 in clinic to assist in management of lymphedema swelling  Pt. will independently don/doff and tolerate compression garment or compression bandages x1 in clinic  to ensure independent management of lymphedema.  Patient will demonstrate proper posture  to minimize pain and tightness in the affected quadrant.  MLD will be performed clinically to improve lymphangiomotoricity, thereby rerouting lymphatic flow to accomodate cancer treatment effects on the lymphatic system.    In 1-6 months:    Pt will be independent with all self care related to lymphedema.      Prognosis for goal achievement:   Good due to good motivation.        PLAN:    mechanics, taping, and pump/ equipment  recommendations     Planned frequency and duration  of treatment:    2x week for 2 weeks then 1x week for 6 weeks.            SUBJECTIVE:  Patient???s communication preference: verbal, written, visual, prn     Pt reports that she had infusion on Friday and reports that she is fatigued but not as badly as first round.  Reports spoke to MD regarding neuropathy and is being referred   Pain specialists due to neuropathy.     Onset of swelling:  Since drains removed.      Location of pain: No pain, but sig neuropathy all 4 extremities, worse on feet.   Current Pain:  3/10 bilat feet.      History of Present Illness/ Pt reports:   Pt reports recurrent Seroma since drains removed . Has been drained multiple times 1s time 360 ml, second time 310,  35 mil, 3rd time 55 ml, 4th time 48 ml.  Pt reports has not noticed swelling in extremity.  Some ROM  Deficits/ tightness noted.       Home environment:       Lives with family. Has two kids in college   Sleeping position: bed     Pt has caregiver available, capable and willing to help:yes     Occupation/ Activities/Recreation:        Occupational History   ??? Not on file       Prior Functional Status:   Independent    Current Functional Limitations: The patient reports the following limitations based on verbal description and a Quick DASH score of  which indicates 6% impairment of the upper extremity:  arm, shoulder, and hand pain    Sexual Dysfunction or Bowel or Bladder changes : none    History of infections: no    Previous Lymphedema Treatment Type: none    Current management: n/a       Medical hx/conditions/surgical procedures, medications, allergies: Reviewed      Recent Procedures/Tests/Findings:  Oncology History    2018: Left breast cT2 N1a, IDC, G1, +/+/-.        Malignant neoplasm of upper-outer quadrant of left breast in female, estrogen receptor positive (CMS-HCC)    06/07/2017 -  Presenting Symptoms     Left breast palpable UOQ mass         06/07/2017 Interval Scan(s)     Diagnostic [facility] at [facility]: left breast UOQ mass difficult to measure, but appears to measure at least 5.6 cm.    [place]: left breast ill defined shadowing lesion at 2:00, 6 CFN measuring 3.7 x 2.6 x 2.5 cm.     Single mildly abnormal node in left axilla with constriction of fatty hilum.         06/13/2017 Biopsy     [facility] core biopsy of left breast 2:00, 6 CFN: IDC, G1, ER+(95%), PR+(30%), HER2 negative by FISH.  [facility] core biopsy left axillary LN: +LN metastasis.         06/13/2017 -  Other     Seen in [facility] ED following core biopsy in [facility] for axillary pain with non-expanding 4 cm hematoma noted in left axilla. No intervention required other than symptomatic measures (compression, pain management).         06/20/2017 Tumor Board     [facility] recs: left breast cT2 N1a IDC, G1, receptors pending. Per outside imaging there was a single abnormal LN found by [place] only. Awaiting receptors. Need SAVI into that clipped node. Discussed that she otherwise fits ACOSOG Z0011 criteria with negative clinical exam prior to biopsy and single abnormal LN on axillary [place]. Reasonable to do SLNB and excise biopsied node as well. If receptors favorable, consider surgery first approach, good BCT candidate. If high risk (TN, HER+) may need NACT approach. Briefly discussed ALTERNATE, but consensus was that she will likely be recommended for chemo given age and nodal positivity. Meeting surgery, rad/onc today. Will circle back with med/onc as needed.         07/03/2017 Surgery     Left [facility] localized/US guided partial mastectomy and left sentinel lymph node biopsy with targeted axillary lymph node excision: Invasive mammary carcinoma with mixed ductal and lobular features present in association with in situ carcinoma with mixed ductal and lobular features measuring 8.3 cm with positive inferior and lateral margin. Sentinel lymph node biopsy: 1/1 node positive for metastatic disease measuring 7 mm. No ECE. Palpable axillary lymph nodes: 4/4 lymph nodes with metastatic disease measuring 10 mm.  No ECE.  Additional palpable axillary lymph node 0/1 with isolated tumor cells. SAVI scout in the axilla was defective and there was no localization signal from the SAVI probe.         08/14/2017 Surgery     Left re-excision, ALND w ARM: Lateral margin: Invasive carcinoma with mixed ductal and lobular features measuring 7mm w LVI, margin negative.   Inferior margin: Multiple foci of lymphovasular invasion present.  No evidence of stromal invasion (final margin negative)  Left ALND: ??Two of twenty-three lymph nodes positive for carcinoma (2/23).  Size of larger tumor deposit: 6 mm.  Extracapsular extension: Absent         09/21/2017 -  Chemotherapy     Started ddTaxol--->ddAC         09/21/2017 Adverse Reaction     First Taxol infused for ~20 minutes resulting in flushing with chest tightness and nausea.  Infusion was stopped and administered.  Symptoms subsided within 20 minutes but she continued to feel some chest tightness with deep inspiration.  Albuterol nebulizer was given.  She then returned to baseline and infusion was restarted at 1/2 rate and titrated up slowly.  She was able to complete infusion without any further events.          10/05/2017 -  Chemotherapy     Switching to Abraxane due to infusion reaction         10/11/2017 Adverse Reaction     Acute DVT Rt interval jugular, brachiocephalic, subclavian vein-now on Eliquis 5 mg qd.          12/05/2017 -  Chemotherapy     Chemotherapy Treatment    Treatment Goal Curative   Line of Treatment [No plan line of treatment]   Plan Name OP BREAST AC (DOSE DENSE) Start Date 12/07/2017   End Date 01/19/2018 (Planned)   Provider Willeen Niece, MD   Chemotherapy dexamethasone (DECADRON) tablet 12 mg, 12 mg, Oral, Once, 2 of 4 cycles  Administration: 12 mg (12/07/2017), 12 mg (12/21/2017)  cyclophosphamide (CYTOXAN) 1,254 mg in sodium chloride (NS) 0.9 % 250 mL IVPB, 600 mg/m2 = 1,254 mg, Intravenous, Once, 2 of 4 cycles  Administration: 1,254 mg (12/07/2017), 1,254 mg (12/21/2017)  DOXOrubicin (ADRIAMYCIN) syringe 125.4 mg, 60 mg/m2 = 125.4 mg, Intravenous, Once, 2 of 4 cycles  Administration: 125.4 mg (12/07/2017), 125.4 mg (12/21/2017)                 OBJECTIVE:        Photos 12/24/17     Girth Measurements:      DYNAMIC GAIT INDEX    Grading: record the lowest category that applies.   1.Gait level surface: 3  Instructions: Walk at your normal speed from here to the next mark (20???).     (3) Normal: walks 20???, no assistive devices, good speed, no evidence for imbalance, normal gait pattern.   (2) Mild impairment: walks 20???, uses assistive devices, slower speed, mild gait deviations.   (1) Moderate impairment: walks 20???, slow speed, abnormal gait patters, evidence for imbalance.   (0) Severe impairment: cannot walk 20??? without assistance, severe gait deviations or imbalance.     2.Change in gait speed: 3  Instructions: Begin walking at your normal pace (for 5???), when I tell you ???go???, walk as fast as you can (for 5???). When I tell you ???slow???, walk as slowly as you can (for 5???).     (3) Normal: Able to smoothly change walking speed without loss of balance or  gait deviation. Shows significant difference in walking speeds between normal, fast and slow paces.   (2) Mild impairment: Is able to change speed but demonstrates mild gait deviations, or no gait deviations but unable to achieve a significant change in velocity, or uses as assistive device.   (1) Moderate impairment: Makes only minor adjustments to walking speed, or accomplishes a change in speed with significant gait deviations, or changes speed but loses balance but is able to recover and continue walking.   (0) Severe impairment: Cannot change speeds, or loss balance and has to reach for a wall or be caught.     3.Gait with horizontal head turns: 1  Instructions: Begin walking at your normal pace. When I tell you to ???look right???, keep walking straight, but turn your head to the right. Keep looking to the right until I tell you ???look left???, then keep walking straight and turn your head to the left. Keep your head to the left until I tell you, ???look straight???, then keep walking straight, but return your head to the centre.     (3) Normal: Performs head turns smoothly with no change in gait.   (2) Mild impairment: Performs head turns smoothly with slight change in gait velocity, i.e. minor disruption to smooth gait path or uses walking aid.   (1) Moderate impairment: Performs head turns with moderate change in gait velocity, slows down, staggers, but recovers, can continue to walk.   (0) Severe impairment: Performs task with severe disruption of gait, i.e. staggers outside 15??? path, loses balance, stops, reaches for wall.     4.Gait with vertical head turns:3  Instructions: Begin walking at your normal pace. When I tell you to ???look up???, keep walking straight, but tip your head and look up. Keep looking up until I tell you, ???look down???. Then keep walking straight and turn your head down. Keep looking down until I tell you, ??? look straight???, then keep walking straight, but return your head to the centre.     (3) Normal: Performs head turns smoothly with no change in gait.   (2) Mild impairment: Performs head turns smoothly with slight change in gait velocity, i.e. minor disruption to smooth gait path or uses walking aid.   (1) Moderate impairment: Performs head turns with moderate change in gait velocity, slows down, staggers, but recovers, can continue to walk.   (0) Severe impairment: Performs task with severe disruption of gait, i.e. staggers outside 15??? path, loses balance, stops, reaches for wall.     5.Gait and pivot turn: 1  Instructions: Begin walking at your normal pace. When I tell you, ???turn and stop???, turn as quickly as you can to face the opposite direction and stop.     (3) Normal: Pivot turns safely within 3 seconds and stops quickly with no loss of balance.   (2) Mild impairment: pivot turns safely in >3 seconds and stops with no loss of balance.   (1) Moderate impairment: Turns slowly, requires verbal cueing, requires several small steps to catch balance following turn and stop.   (0) Severe impairment: Cannot turn safely, requires assistance to turn and stop.     6.Step over obstacle: 2  Instructions: Begin walking at your normal speed. When you come to the shoebox, step over it, not around it, and keep walking.     (3) Normal: Is able to step over box without changing gait speed; no evidence for imbalance.   (2) Mild impairment: Is able to  step over shoe box, but must slow down and adjust steps to clear box safely.   (1) Moderate impairment: Is able to step over box but must stop, then step over. May require verbal cueing.   (0) Severe impairment: Cannot perform without assistance.     7.Step around obstacles: 3  Instructions: Begin walking at normal speed. When you come to the first cone (about 6??? away), walk around the right side of it. When you some to the second cone (6??? past first cone), walk around it to the left.     (3) Normal: Is able to walk safely around cones safely without changing gait speed; no evidence of imbalance.   (2) Mild impairment: Is able to step around both cones, but must slow down and adjust steps to clear cones.   (1) Moderate impairment: Is able to clear cones but must significantly slow speed to accomplish task, or requires verbal cueing.   (0) Severe impairment: Unable to clear cones, walks into one or both cones, or requires physical assistance.     8.Steps: 2  Instructions: Walk up these stairs as you would at home.(i.e. using a rail if necessary. At the top, turn around and walk down.     (3) Normal: Alternating feet, no rail.   (2) Mild impairment: Alternating feet, must use rail.   (1) Moderate impairment: Two feet to a stair, must use rail.   (0) Severe impairment: Cannot do safely.     TOTAL SCORE: 18/24  F:\Intranet\[facility] website\physiotherapy section\Dynamic Gait Index v.doc       Location of swelling:  L chest and axilla.     Skin: WFL except for the following:  increased skin thickness    Stemmer???s sign:no.      Sensation Intact to light touch:   Decreased sensation under axilla, around scar, and posterior lateral upper arm     Incision: healing appropriately for post op status, adhered to underlying tissue    Axillary Web Syndrome::yes     Posture/Observations:  Forward head, rounded shoulders        Range of Motion/Flexibilty:   UE WFL except  End range overhead activity.     Strength/MMT:   UE WFL     Total Treatment Time: 55 min        Treatment Rendered:      Manual therapy  X 45 min    - LUQ MLD.     Self Care x   - assessed look of swelling in neck ( see picture) .    - emailed Dr Avis Epley regarding this              Today's Charges (noted here with $$):       ADLs/IADLs   $$ PT Self Care/Home Management Training [mins]: 10  Therapeutic Interventions  $$ Manual Therapy [mins]: 45               Data from the patient???s history and examination indicates the presence of 1-2 personal factors and/or comorbidities that will impact the plan of care for the current problem, including cancer history Examination of body systems includes 4 or more  body structures, functions, activity limitations and/or participation restrictions including integumentary system, lymphatic system, self care and scar mobility. The assessed clinical presentation is evolving based on  changing lymphatic status. These factors result in the need for moderate clinical decision making.    Equipment provided/recommended:   written HEP    Communication/consultation with other professionals:  Email to DME supplier Clyda Greener re: garment needs        I attest that I have reviewed the above information.  Signed: Kirke Corin, PT   12/24/2017 11:31 AM

## 2017-12-24 NOTE — Unmapped (Signed)
Hi,     Patient contacted the Communication Center regarding the following:    Wanted to see if she could come in today to see her provider. Informed patient that she was scheduled for 12/25/2017 at 4:00 pm. Patient still wanted to see if she could be seen today    Please feel free to contact patient at [phone].    Thanks in advance,    Jodi Mourning  [facility] Communication Center   [phone]

## 2017-12-24 NOTE — Unmapped (Signed)
Hi,     Erika Cabrera contacted the Communication Center regarding the following:    Patient spoke with [name] earlier regarding problems with port. Patient was told she could come today at that time but declined. Patient is now able to come to clinic and would like to still come today.    Please feel free to contact [phone].    Thanks in advance,    [name]  [facility] Communication Center   [phone]

## 2017-12-24 NOTE — Unmapped (Signed)
Call made to patient after receiving concerning message re: SOB and swelling superior/medially to port. Patient states that she is not having SOB currently, but has at times. Has had some hot flashes as she describes, but states her temperature has been WNL. Informed patient that Dr. Avis Epley would like her to be further evaluated this week. Offered patient to come into infusion to see NP and she stated she would probably not have a ride in order to get there today and would prefer to come tomorrow. Informed her that we could get her in to see Dr. Avis Epley NP tomorrow, 5/21; offered open time slots to her. She states she would like to come at [employer]. Will inform the scheduler.     Advised patient that if she develops any other symptoms (I.e. Sudden onset SOB, fever, etc) she should seek immediate medical attention at an emergency room. She verbalized understanding and thanked for the call.

## 2017-12-24 NOTE — Unmapped (Signed)
-----   Message from Willeen Niece, MD sent at 12/24/2017  1:34 PM EDT -----  I'm at [facility] for a couple days. Derrick Tiegs can you call and get her in to be seen this week? thanks  ----- Message -----  From: Lodema Hong, PT  Sent: 12/24/2017  11:21 AM  To: Willeen Niece, MD     I saw Ms Kestner in clinic today and noticed some swelling medially and superiorly to her port. She has been complaining of increased SOB symptoms  And I just wanted to make sure her port was clear and no potential clots. Can someone reach out to her?       Thank you so much.       - I have a picture in my note in her chart, but It would not let me send it to you in this message.

## 2017-12-25 ENCOUNTER — Encounter: Admit: 2017-12-25 | Discharge: 2017-12-26 | Payer: PRIVATE HEALTH INSURANCE | Attending: Family | Primary: Family

## 2017-12-25 ENCOUNTER — Encounter: Admit: 2017-12-25 | Discharge: 2017-12-26 | Payer: PRIVATE HEALTH INSURANCE

## 2017-12-25 DIAGNOSIS — C50412 Malignant neoplasm of upper-outer quadrant of left female breast: Principal | ICD-10-CM

## 2017-12-25 DIAGNOSIS — Z170 Estrogen receptor positive status [ER+]: Secondary | ICD-10-CM

## 2017-12-25 LAB — CBC W/ AUTO DIFF
BASOPHILS ABSOLUTE COUNT: 0 10*9/L (ref 0.0–0.1)
BASOPHILS RELATIVE PERCENT: 0.2 %
EOSINOPHILS ABSOLUTE COUNT: 0 10*9/L (ref 0.0–0.4)
EOSINOPHILS RELATIVE PERCENT: 0.2 %
HEMATOCRIT: 34.3 % — ABNORMAL LOW (ref 36.0–46.0)
HEMOGLOBIN: 11.7 g/dL — ABNORMAL LOW (ref 12.0–16.0)
LARGE UNSTAINED CELLS: 0 % (ref 0–4)
LYMPHOCYTES ABSOLUTE COUNT: 1 10*9/L — ABNORMAL LOW (ref 1.5–5.0)
LYMPHOCYTES RELATIVE PERCENT: 5.3 %
MEAN CORPUSCULAR HEMOGLOBIN CONC: 34 g/dL (ref 31.0–37.0)
MEAN CORPUSCULAR HEMOGLOBIN: 28.9 pg (ref 26.0–34.0)
MEAN PLATELET VOLUME: 7.9 fL (ref 7.0–10.0)
MONOCYTES ABSOLUTE COUNT: 0.2 10*9/L (ref 0.2–0.8)
MONOCYTES RELATIVE PERCENT: 0.8 %
NEUTROPHILS ABSOLUTE COUNT: 17.1 10*9/L — ABNORMAL HIGH (ref 2.0–7.5)
NEUTROPHILS RELATIVE PERCENT: 93.3 %
PLATELET COUNT: 150 10*9/L (ref 150–440)
RED CELL DISTRIBUTION WIDTH: 16.3 % — ABNORMAL HIGH (ref 12.0–15.0)
WBC ADJUSTED: 18.4 10*9/L — ABNORMAL HIGH (ref 4.5–11.0)

## 2017-12-25 LAB — GLUCOSE RANDOM: Glucose:MCnc:Pt:Ser/Plas:Qn:: 85

## 2017-12-25 LAB — COMPREHENSIVE METABOLIC PANEL
ALKALINE PHOSPHATASE: 77 U/L (ref 38–126)
ALT (SGPT): 32 U/L (ref 15–48)
ANION GAP: 9 mmol/L (ref 9–15)
AST (SGOT): 21 U/L (ref 14–38)
BILIRUBIN TOTAL: 0.5 mg/dL (ref 0.0–1.2)
BLOOD UREA NITROGEN: 16 mg/dL (ref 7–21)
BUN / CREAT RATIO: 28
CALCIUM: 9.1 mg/dL (ref 8.5–10.2)
CO2: 27 mmol/L (ref 22.0–30.0)
CREATININE: 0.58 mg/dL — ABNORMAL LOW (ref 0.60–1.00)
EGFR MDRD AF AMER: >=60 mL/min/{1.73_m2} (ref >=60)
EGFR MDRD NON AF AMER: >=60 mL/min/{1.73_m2} (ref >=60)
GLUCOSE RANDOM: 85 mg/dL (ref 65–179)
POTASSIUM: 3.5 mmol/L (ref 3.5–5.0)
PROTEIN TOTAL: 5.9 g/dL — ABNORMAL LOW (ref 6.5–8.3)

## 2017-12-25 LAB — MEAN CORPUSCULAR HEMOGLOBIN CONC: Lab: 34

## 2017-12-25 NOTE — Unmapped (Signed)
It was a pleasure seeing you today.   Continue with the current plan of care.  Follow-up as directed.    Orders Placed This Encounter   Procedures   ??? CT Chest W Contrast       Labs from today if done:  Lab on 12/25/2017   Component Date Value   ??? Sodium 12/25/2017 140    ??? Potassium 12/25/2017 3.5    ??? Chloride 12/25/2017 104    ??? CO2 12/25/2017 27.0    ??? BUN 12/25/2017 16    ??? Creatinine 12/25/2017 0.58*   ??? BUN/Creatinine Ratio 12/25/2017 28    ??? EGFR MDRD Non Af Amer 12/25/2017 >=60    ??? EGFR MDRD Af Amer 12/25/2017 >=60    ??? Anion Gap 12/25/2017 9    ??? Glucose 12/25/2017 85    ??? Calcium 12/25/2017 9.1    ??? Albumin 12/25/2017 3.7    ??? Total Protein 12/25/2017 5.9*   ??? Total Bilirubin 12/25/2017 0.5    ??? AST 12/25/2017 21    ??? ALT 12/25/2017 32    ??? Alkaline Phosphatase 12/25/2017 77    ??? WBC 12/25/2017 18.4*   ??? RBC 12/25/2017 4.04    ??? HGB 12/25/2017 11.7*   ??? HCT 12/25/2017 34.3*   ??? MCV 12/25/2017 84.8    ??? MCH 12/25/2017 28.9    ??? MCHC 12/25/2017 34.0    ??? RDW 12/25/2017 16.3*   ??? MPV 12/25/2017 7.9    ??? Platelet 12/25/2017 150    ??? Neutrophils % 12/25/2017 93.3    ??? Lymphocytes % 12/25/2017 5.3    ??? Monocytes % 12/25/2017 0.8    ??? Eosinophils % 12/25/2017 0.2    ??? Basophils % 12/25/2017 0.2    ??? Neutrophil Left Shift 12/25/2017 1+*   ??? Absolute Neutrophils 12/25/2017 17.1*   ??? Absolute Lymphocytes 12/25/2017 1.0*   ??? Absolute Monocytes 12/25/2017 0.2    ??? Absolute Eosinophils 12/25/2017 0.0    ??? Absolute Basophils 12/25/2017 0.0    ??? Large Unstained Cells 12/25/2017 0    ??? Anisocytosis 12/25/2017 Slight*        Future Appointments:  Future Appointments   Date Time Provider Department Center   01/03/2018 10:30 AM Sherin Shelah Lewandowsky, PT [facility]   01/10/2018 10:30 AM Sherin Shelah Lewandowsky, PT [facility]   01/11/2018  7:30 AM ADULT ONC LAB [facility]   01/11/2018  8:30 AM Marcy Panning, FNP [id number]   01/11/2018  9:30 AM ONCINF CHAIR 38 [facility]   01/17/2018 10:30 AM Sherin Shelah Lewandowsky, PT [facility]   01/25/2018  7:30 AM ADULT ONC LAB [facility]   01/25/2018  8:30 AM Marcy Panning, FNP [facility]   01/25/2018  9:30 AM ONCDEV CHAIR 52 [facility]   01/31/2018 10:30 AM Sherin Shelah Lewandowsky, PT [facility]       For appointments call [phone]  For new symptoms or health related issues, please call   Nurse Navigator: [name], RN [phone]  On weekends and after hours on weekdays, please call [phone] for the oncology fellow on call.      If you use my[facility], please know that I check messages only twice a week. If your message is urgent, please call or page your nurse navigator or the on-call fellow.

## 2017-12-25 NOTE — Unmapped (Signed)
1115:  Labs drawn and sent for analysis.  Care provided by  Carlynn Herald, RN

## 2017-12-25 NOTE — Unmapped (Signed)
[facility] Triage Note     Patient: Erika Cabrera     Reason for call: Potential port-a-cath problem    Time call returned: 8:39     Phone Assessment: Spoke with patient, she stated she spoke with Nathaneil Canary NP this am and she will be in for evaluation at noon to see her.     Triage Recommendations: Will forward to care team     Patient Response: N/A     Outstanding tasks: Notification

## 2017-12-26 NOTE — Unmapped (Signed)
Patient called and asked to speak to a nurse as soon as possible. Patient stated that she has severe constipation. Patient stated that it has been ongoing for past couple of days.    Thanks in advance,  Yolanda Manges.  [facility] Communication Center  [phone]

## 2017-12-26 NOTE — Unmapped (Signed)
[facility] Triage Note     Patient: Erika Cabrera     Reason for call: constipation    Time call returned: 1550     Phone Assessment: returned call to pt. Pt reports constipation. Last good BM on Saturday. States that she has had several  small BMs today. Reports small amt of gas. States that her abd is not hard but feels very uncomfortable.     Reports that constipation is always an issue for her.     Last night she took miralax at 10pm Had colace with laxative this morning. Took suppository today  And had very small BM after.     Drinking lots of fluids.      Triage Recommendations: Encouraged pt to take colace bid. Suggested that she try Milk of Mag 2 Tbl every 6 hours until bowels move.     Told her I would notify team and call back with any other suggestions.     Discouraged pt from using suppository until I discussed with team.     Patient Response: verbalized understanding and agreement with plan     Outstanding tasks:follow up as needed

## 2017-12-27 MED ORDER — LIDOCAINE-DIPHENHYD-AL-MAG-SIM 200 MG-25 MG-400 MG-40MG/30ML MOUTHWASH
Freq: Four times a day (QID) | OROMUCOSAL | 1 refills | 0 days | Status: CP | PRN
Start: 2017-12-27 — End: 2018-01-22

## 2017-12-27 NOTE — Unmapped (Addendum)
Erika Cabrera:     Referring Physician: Cala Bradford, Md  [address]  [facility] [place], [place] [address].     PCP: Stacie Acres WHITE, MD    Assessment/Plan:  I spent at least 40 minutes with this patient more than 50% in counseling. The following issues were discussed:    A 62yo woman with a Left Erika pT3pN2a, IDC, G2, ER+(95%)/PR+(30%)/HER2-neg, 5/6 LN positive, +LVI and positive surgical margins s/p BCT with targeted axillary LN bx on 07/03/17. Unfortunately her pathology revealed residual disease and she requires re-excision with ALND. Completed this on 08/14/17 showing 7 mm of residual tumor, negative margins, 2/23 +LN with larger tumor deposit of 6 mm.    Planned for ddAC-T but H/O neuropathy; making weekly paclitaxel challenging so tried q 2 week taxol. In her, we started with her taxane because of wound healing and inability to get a port. However, she had a hypesensitivity reaction  into Taxol infusion and was able to complete at a slower rate.  Accordingly, switched to q3w Abraxane. Now, S/P 2 cycles of ddAC with Rt upper chest wall swelling concerning for new DVT VS port-line leak VS displaced lymphatic fluid. Sending for doppler and CT scan to further evaluate her site. Port NOT to be used until cleared by CT imaging/Doppler. Her blood counts are hemodynamically stable and she does not appear to be in any cardiac/respiratory distress.    I spent at least 40 minutes with this patient more than 50% in counseling. The following issues were discussed:     - Plan: S/P 1 dose of Taxol (2/2 infusion rxn at C1), f/b 3 doses of Abraxane   S/P C2D1 ddAC, EF >55% (07/2017)  - Antiestrogen therapy (AI) for 5-50yrs  - CT-CAP 07/25/17 NED  - Neuropathy: persistent but controlled on gabapentin  - Anxiety-provided more education Re: a/e of ddAC and reviewed anti-emetic regimen  - Bone pain with Neulasta with 1st Cycle of ddTaxol    Will attempt Udenyca again with pharmacy guidance    On how to manage bone pain. Will use Claritin 3 days     Prior to Neulasta with otc Tylenol/Oxycodone prn    ADDENDUM: RUE doppler negative for new Thrombus-showing only improvement in previous DVTs.  Chest CT scheduled for 12/28/17.    RTC in 2 weeks for C3 ddAC with Udenyca    Reason for Visit: A 62 y.o. female with Erika cancer   Receiving adjuvant chemotherapy for her pT3pN2a HR+ Her2 negative left sided Erika cancer.    10/11/17 She initially presented with self-palpated left Erika mass and workup/surgery revealed an 8.3cm tumor HR+/HER2-neg with positive LNs. She is recovering well from surgery.  Drains removed a week ago.She has some pain on the left side of her Erika and pain plus numbness down inner side of her left arm. She reports that she has baseline neuropathy in her feet and is followed by a Neuroloogist in [place].  She does not have diabetes and states that the neuropathy is due to structural issues with her feet and high arches and runs in her family. She has no history of heart issues, never had an MI or heart failure. She recently moved to [facility] from [place].  She was laid off from her job and was working part-time but could not qualify for [employer] on the open market so she had to quit her part-time job.  States she would appreciate seeing  a Artist to figure out how to pay for all this.    Interval History:  Here with husband.  Here ahead of normally scheduled chemo appt d/t concern over Rt chest wall swelling.  Seen 12/24/17 by lymphedema PT for STAGE II LYMPHEDEMA.  Found to have w/medial-superior swelling patch to port site on exam.   Difficulty with lying down on the exam table with some mild SOB reported on Monday.  Today, pt denies any cp, palpitations or SOB.  Does have some mild fatigue, but CBC and CMP are adequate/stable.  Notes the swelling is not getting any bigger, nor is it tender.  Denies any trauma to the RUE.    H/O:  1. Previous lumpectomy with ALND (2/23) to Left Erika/axilla, with subsequent re-excision to achieve negative margins.  Started adjvu chemo 09/2017 with taxane regimen, now has completed C2 ddAC 12/21/17.  2. Recent DVT Dx 10/11/17 Rt internal jugular, brachiocephalic and subclavian vein.   Compliant with Eliquis.     Additionally, reporting mild to moderate appetite changes, constipation, CIPN, nausea, HF, insomnia with mouth sores there are intermittent.  Performance status=1    Erika Cancer History:   Oncology History    2018: Left Erika cT2 N1a, IDC, G1, +/+/-.        Malignant neoplasm of upper-outer quadrant of left Erika in female, estrogen receptor positive (CMS-HCC)    06/07/2017 -  Presenting Symptoms     Left Erika palpable UOQ mass         06/07/2017 Interval Scan(s)     Diagnostic MMG at [facility]: left Erika UOQ mass difficult to measure, but appears to measure at least 5.6 cm.    [place]: left Erika ill defined shadowing lesion at 2:00, 6 CFN measuring 3.7 x 2.6 x 2.5 cm.     Single mildly abnormal node in left axilla with constriction of fatty hilum.         06/13/2017 Biopsy     [facility] core biopsy of left Erika 2:00, 6 CFN: IDC, G1, ER+(95%), PR+(30%), HER2 negative by FISH.  [facility] core biopsy left axillary LN: +LN metastasis.         06/13/2017 -  Other     Seen in [facility] ED following core biopsy in [facility] for axillary pain with non-expanding 4 cm hematoma noted in left axilla. No intervention required other than symptomatic measures (compression, pain management).         06/20/2017 Tumor Board     [facility] recs: left Erika cT2 N1a IDC, G1, receptors pending. Per outside imaging there was a single abnormal LN found by [place] only. Awaiting receptors. Need SAVI into that clipped node. Discussed that she otherwise fits ACOSOG Z0011 criteria with negative clinical exam prior to biopsy and single abnormal LN on axillary [place]. Reasonable to do SLNB and excise biopsied node as well. If receptors favorable, consider surgery first approach, good BCT candidate. If high risk (TN, HER+) may need NACT approach. Briefly discussed ALTERNATE, but consensus was that she will likely be recommended for chemo given age and nodal positivity. Meeting surgery, rad/onc today. Will circle back with med/onc as needed.         07/03/2017 Surgery     Left [facility] localized/US guided partial mastectomy and left sentinel lymph node biopsy with targeted axillary lymph node excision: Invasive mammary carcinoma with mixed ductal and lobular features present in association with in situ carcinoma with mixed ductal and lobular features measuring 8.3 cm with positive  inferior and lateral margin. Sentinel lymph node biopsy: 1/1 node positive for metastatic disease measuring 7 mm. No ECE. Palpable axillary lymph nodes: 4/4 lymph nodes with metastatic disease measuring 10 mm.  No ECE.  Additional palpable axillary lymph node 0/1 with isolated tumor cells. SAVI scout in the axilla was defective and there was no localization signal from the SAVI probe.         08/14/2017 Surgery     Left re-excision, ALND w ARM: Lateral margin: Invasive carcinoma with mixed ductal and lobular features measuring 7mm w LVI, margin negative.   Inferior margin: Multiple foci of lymphovasular invasion present.  No evidence of stromal invasion (final margin negative)  Left ALND: ??Two of twenty-three lymph nodes positive for carcinoma (2/23).  Size of larger tumor deposit: 6 mm.  Extracapsular extension: Absent         09/21/2017 -  Chemotherapy     Started ddTaxol--->ddAC         09/21/2017 Adverse Reaction     First Taxol infused for ~20 minutes resulting in flushing with chest tightness and nausea.  Infusion was stopped and administered.  Symptoms subsided within 20 minutes but she continued to feel some chest tightness with deep inspiration.  Albuterol nebulizer was given.  She then returned to baseline and infusion was restarted at 1/2 rate and titrated up slowly.  She was able to complete infusion without any further events.          10/05/2017 -  Chemotherapy     Switching to Abraxane due to infusion reaction         10/11/2017 Adverse Reaction     Acute DVT Rt interval jugular, brachiocephalic, subclavian vein-now on Eliquis 5 mg qd.          12/05/2017 -  Chemotherapy     Chemotherapy Treatment    Treatment Goal Curative   Line of Treatment [No plan line of treatment]   Plan Name OP Erika AC (DOSE DENSE)   Start Date 12/07/2017   End Date 01/19/2018 (Planned)   Provider Willeen Niece, MD   Chemotherapy dexamethasone (DECADRON) tablet 12 mg, 12 mg, Oral, Once, 2 of 4 cycles  Administration: 12 mg (12/07/2017), 12 mg (12/21/2017)  cyclophosphamide (CYTOXAN) 1,254 mg in sodium chloride (NS) 0.9 % 250 mL IVPB, 600 mg/m2 = 1,254 mg, Intravenous, Once, 2 of 4 cycles  Administration: 1,254 mg (12/07/2017), 1,254 mg (12/21/2017)  DOXOrubicin (ADRIAMYCIN) syringe 125.4 mg, 60 mg/m2 = 125.4 mg, Intravenous, Once, 2 of 4 cycles  Administration: 125.4 mg (12/07/2017), 125.4 mg (12/21/2017)                   Past Medical History:  Patient Active Problem List   Diagnosis   ??? Malignant neoplasm of upper-outer quadrant of left Erika in female, estrogen receptor positive (CMS-HCC)       Gyn History:   LMP 2012    Surgical History:   Past Surgical History:   Procedure Laterality Date   ??? ANAL FISSURECTOMY     ??? Erika BIOPSY Left 06/2017    malignant   ??? Erika BIOPSY Left 06/2017    Lympnode bx-positive   ??? IR INSERT PORT AGE GREATER THAN 5 YRS  09/28/2017    IR INSERT PORT AGE GREATER THAN 5 YRS 09/28/2017 Rush Barer, MD IMG VIR [facility]   ??? PR BX/REMV,LYMPH NODE,DEEP AXILL Left 07/03/2017    Procedure: BX/EXC LYMPH NODE; OPEN, DEEP AXILRY NODE;  Surgeon: Carmin Muskrat  Dellis Anes, DO;  Location: ASC OR [facility];  Service: Surgical Oncology   ??? PR INTRAOPERATIVE SENTINEL LYMPH NODE ID W DYE INJECTION Left 07/03/2017    Procedure: INTRAOPERATIVE IDENTIFICATION SENTINEL LYMPH NODE(S) INCLUDE INJECTION NON-RADIOACTIVE DYE, WHEN PERFORMED;  Surgeon: Talbert Cage, DO;  Location: ASC [facility];  Service: Surgical Oncology   ??? PR INTRAOPERATIVE SENTINEL LYMPH NODE ID W DYE INJECTION Left 08/14/2017    Procedure: INTRAOPERATIVE IDENTIFICATION SENTINEL LYMPH NODE(S) INCLUDE INJECTION NON-RADIOACTIVE DYE, WHEN PERFORMED;  Surgeon: Talbert Cage, DO;  Location: ASC OR [facility];  Service: Surgical Oncology Erika   ??? PR MASTECTOMY, PARTIAL Left 07/03/2017    Procedure: MASTECTOMY, savi scout PARTIAL (EG, LUMPECTOMY, TYLECTOMY, QUADRANTECTOMY, SEGMENTECTOMY);  Surgeon: Talbert Cage, DO;  Location: ASC OR [facility];  Service: Surgical Oncology   ??? PR MASTECTOMY, PARTIAL Left 08/14/2017    Procedure: MASTECTOMY, PARTIAL (EG, LUMPECTOMY, TYLECTOMY, QUADRANTECTOMY, SEGMENTECTOMY) Re-excision;  Surgeon: Talbert Cage, DO;  Location: ASC OR [facility];  Service: Surgical Oncology Erika   ??? PR REMOVE ARMPITS LYMPH NODES COMPLT Left 08/14/2017    Procedure: AXILLARY LYMPHADENECTOMY; COMPLETE;  Surgeon: Talbert Cage, DO;  Location: ASC OR [facility];  Service: Surgical Oncology Erika       Medications:  Current Outpatient Medications   Medication Sig Dispense Refill   ??? acetaminophen (TYLENOL) 500 MG tablet Take 1,000 mg by mouth every four (4) hours as needed for pain.     ??? apixaban (ELIQUIS) 5 mg Tab Take 1 tablet (5 mg total) by mouth Two (2) times a day. 60 tablet 5   ??? buPROPion (WELLBUTRIN XL) 300 MG 24 hr tablet Take 300 mg by mouth daily.     ??? dexamethasone (DECADRON) 4 MG tablet Take 2 tablets (8 mg total) by mouth daily. Take on Days 2, 3, and 4. 24 tablet 0   ??? docusate sodium (COLACE) 100 MG capsule Take 100 mg by mouth daily with breakfast.     ??? FLUoxetine (PROZAC) 10 MG tablet Take 10 mg by mouth daily.     ??? GABAPENTIN ORAL Take 900 mg by mouth nightly. Pt taking 1 capsule at noon and 3 capsules at 8PM     ??? loratadine (CLARITIN ORAL) Take by mouth.     ??? LORazepam (ATIVAN) 0.5 MG tablet Take 1 tablet (0.5 mg total) by mouth nightly as needed (take for difficulty sleeping on nights taking dexamethasone). 30 tablet 0   ??? ondansetron (ZOFRAN) 4 MG tablet Take 2 tablets (8 mg total) by mouth every eight (8) hours as needed for nausea. 30 tablet 2   ??? ondansetron (ZOFRAN) 8 MG tablet Take 1 tablet (8 mg total) by mouth every twelve (12) hours as needed for nausea. 30 tablet 2   ??? polyethylene glycol (MIRALAX) 17 gram packet Take 17 g by mouth daily.     ??? prochlorperazine (COMPAZINE) 10 MG tablet Take 1 tablet (10 mg total) by mouth every six (6) hours as needed (nausea). 30 tablet 2   ??? ranitidine (ZANTAC) 150 MG tablet Take 150 mg by mouth daily.        No current facility-administered medications for this visit.        Allergies:  Allergies   Allergen Reactions   ??? Erythromycin Diarrhea   ??? Penicillins Rash       Personal and Social History:  Social History     Socioeconomic History   ??? Marital status: Married     Spouse name: Not on file   ???  Number of children: Not on file   ??? Years of education: Not on file   ??? Highest education level: Not on file   Occupational History   ??? Not on file   Social Needs   ??? Financial resource strain: Not on file   ??? Food insecurity:     Worry: Not on file     Inability: Not on file   ??? Transportation needs:     Medical: Not on file     Non-medical: Not on file   Tobacco Use   ??? Smoking status: Never Smoker   ??? Smokeless tobacco: Never Used   Substance and Sexual Activity   ??? Alcohol use: No   ??? Drug use: No   ??? Sexual activity: Not on file   Lifestyle   ??? Physical activity:     Days per week: Not on file     Minutes per session: Not on file   ??? Stress: Not on file   Relationships   ??? Social connections:     Talks on phone: Not on file     Gets together: Not on file     Attends religious service: Not on file     Active member of club or organization: Not on file     Attends meetings of clubs or organizations: Not on file     Relationship status: Not on file Other Topics Concern   ??? Not on file   Social History Narrative    The patient is married. She lives at home with her husband, Ed.  She has two children       Family History:  Cancer-related family history includes Erika cancer in her paternal aunt. There is no history of Cancer or Ovarian cancer.  indicated that the status of her mother is unknown. She indicated that the status of her father is unknown. She indicated that the status of her sister is unknown. She indicated that the status of her maternal grandmother is unknown. She indicated that the status of her maternal grandfather is unknown. She indicated that the status of her paternal grandmother is unknown. She indicated that the status of her paternal grandfather is unknown. She indicated that the status of her daughter is unknown. She indicated that the status of her neg hx is unknown.      Review of Systems: A complete review of systems was obtained including: Constitutional, Eyes, ENT, Cardiovascular, Respiratory, GI, GU, Musculoskeletal, Skin, Neurological, Psychiatric, Endocrine, Heme/Lymphatic, and Allergic/Immunologic systems. It is negative or non-contributory to the patient???s management except for the following: None    Physical Examination:  Vital Signs: BP 120/60  - Pulse 68  - Temp 37.3 ??C (99.2 ??F) (Oral)  - Resp 18  - Ht 171 cm (5' 7.32)  - Wt 89.9 kg (198 lb 4.8 oz)  - SpO2 99%  - BMI 30.76 kg/m??   General:  Healthy-appearing female in no acute distress..  Cardiovascular:  Heart not clinically enlarged.  No significant murmurs or gallops.  No lower extremity edema.    Respiratory:  Chest clear to percussion and auscultation.   Gastrointestinal:  Abdomen soft without masses and tenderness, no hepatosplenomegaly or other masses.   Musculoskeletal:  No bony pain or tenderness.   Skin and Subcutaneous Tissues:  No rash, ecchymoses, purpuric lesions noted. Inferior-medial to port there is a patch of fluid/swelling noted-soft/boggy and non-tender/non-erythematous.        Breasts:  Left Erika with well healing scar.  Mild swelling  lateral left Erika+resolving seroma. Resolving erythema, no fluctuance. Left axilla tender with well healed scar. R Erika normal appearing.  Psychiatric:  Alert and oriented.  Mood is normal.  No other symptoms.   Heme/Lymphatic/Immunologic:  No cervical or axillary adenopathy.   Upper Extremity Lymphedema: None.

## 2017-12-27 NOTE — Unmapped (Signed)
[facility] Triage Note     Patient: Erika Cabrera     Reason for call:  constipation  Time call returned: 1320     Phone Assessment: returned call to pt to follow up regarding constipation. She said that she had a BM yesterday after taking 1 dose of Milk of Mag. And feels relief.     Pt then mentioned that neuropathy to b/l le has gotten worse since last clinic visit. States that pain is a 7/10 when ambulating. Report numbness from calf to bottom of foot. Takes gabapentin  as ordered and states that last night the dose did not touch the pain.     Also reports sore to L side of her mouth that was assessed by team at last clinic visit. Reports that it is swollen, red, and painful. Pt reports that it has gotten worse since clinic visit and is very painful.    Pt using salt water rinses regularly with no relief.  Triage Recommendations:   Reviewed pharm suggestion for bowel regimen with pt.   Bowel Regimen:    Miralax daily +   Senna/docusate 1 tab twice daily. ??     If she does not have regular bowel movements with the above regimen: ??increase the senna/docusate to 1 tab qam and 2 tabs qhs. ??She can then even titrate to 2 tabs BID if needed.     She should continue the bowel regimen daily unless she develops diarrhea.   Told pt I would notify team of concerns and follow up as needed     Told pt I would discuss other concerns with team and follow up.     Patient Response: verbalized understanding and agreement with plan     Outstanding tasks: follow up with pt regarding neuropathy and mouth sore     Patient Pharmacy has been verified and primary pharmacy has been marked as preferred

## 2017-12-28 DIAGNOSIS — Z170 Estrogen receptor positive status [ER+]: Secondary | ICD-10-CM

## 2017-12-28 DIAGNOSIS — C50412 Malignant neoplasm of upper-outer quadrant of left female breast: Principal | ICD-10-CM

## 2017-12-29 ENCOUNTER — Encounter: Admit: 2017-12-29 | Discharge: 2017-12-29 | Payer: PRIVATE HEALTH INSURANCE

## 2018-01-03 DIAGNOSIS — Z170 Estrogen receptor positive status [ER+]: Secondary | ICD-10-CM

## 2018-01-03 DIAGNOSIS — C50412 Malignant neoplasm of upper-outer quadrant of left female breast: Principal | ICD-10-CM

## 2018-01-03 DIAGNOSIS — I890 Lymphedema, not elsewhere classified: Secondary | ICD-10-CM

## 2018-01-03 NOTE — Unmapped (Signed)
Marland Kitchen  OUTPATIENT PHYSICALTHERAPY  Upper Quadrant Lymphedema   Daily Note  Date:01/03/2018    Patient Name: Erika Cabrera  Date of Birth:1955/11/21  Session #:5  Diagnosis:   Encounter Diagnoses   Name Primary?   ??? Malignant neoplasm of upper-outer quadrant of left breast in female, estrogen receptor positive (CMS-HCC) Yes   ??? Lymphedema        Referring Provider: Ophelia Shoulder*    Initial Evaluation and POC certification : 10/25/17-01/23/18      Contraindications:  none    Precautions/Red Flags:  history of cancer      ASSESSMENT/Reason for Referral:    62 y.o. female presents with Stage  II  lymphedema secondary  In chest and under axilla. Swelling around port sig improved since last session.  Neuropathy symptoms are not improving and are worsening per pt report.  Today's session focused on MLD which pt tolerates well. Pt planning to attend stepson's wedding tom and is worried about fatigue levels. Reviewed energy conservation tech with patient.      Patient requires skilled Physical Therapy services  for the following problem list and secondary functional limitations:    Problem List:   Lack of knowledge of lymphedema and self care of this condition, Increased risk of infection, Lack of appropriate compression garment and Uncontrolled lymphedema swelling    Secondary Functional Limitations:  Decreased  ROM of arm and Scar/Myofascial  immobility    Barriers to learning:  no      Patient Goals: Decrease swelling, obtain a compression garment and decrease stiffness    Physical Therapy Goals:   In 3-4 sessions    Pt will have knowledge of proper skin care and lymphedema risk precautions to reduce risk of infection.  Patient will be able to properly demonstrate self-manual lymphatic drainage x1 in clinic to assist in management of lymphedema swelling  Pt. will independently don/doff and tolerate compression garment or compression bandages x1 in clinic  to ensure independent management of lymphedema.  Patient will demonstrate proper posture  to minimize pain and tightness in the affected quadrant.  MLD will be performed clinically to improve lymphangiomotoricity, thereby rerouting lymphatic flow to accomodate cancer treatment effects on the lymphatic system.    In 1-6 months:    Pt will be independent with all self care related to lymphedema.      Prognosis for goal achievement:   Good due to good motivation.      PLAN:    mechanics, taping, and pump/ equipment  recommendations     Planned frequency and duration  of treatment:    2x week for 2 weeks then 1x week for 6 weeks.        SUBJECTIVE:  Patient???s communication preference: verbal, written, visual, prn     Pt reports frustrated regarding Ca dx and how she is feeling. Reports that her energy levels are still decreased.     Onset of swelling:  Since drains removed.      Location of pain: No pain, but sig neuropathy all 4 extremities, worse on feet.   Current Pain:  4/10 bilat feet.      History of Present Illness/ Pt reports:   Pt reports recurrent Seroma since drains removed . Has been drained multiple times 1s time 360 ml, second time 310,  35 mil, 3rd time 55 ml, 4th time 48 ml.  Pt reports has not noticed swelling in extremity.  Some ROM  Deficits/ tightness noted.     Home  environment:       Lives with family. Has two kids in college   Sleeping position: bed     Pt has caregiver available, capable and willing to help:yes     Occupation/ Activities/Recreation:        Occupational History   ??? Not on file       Prior Functional Status:   Independent    Current Functional Limitations: The patient reports the following limitations based on verbal description and a Quick DASH score of  which indicates 6% impairment of the upper extremity:  arm, shoulder, and hand pain    Sexual Dysfunction or Bowel or Bladder changes : none    History of infections: no    Previous Lymphedema Treatment Type: none    Current management: n/a       Medical hx/conditions/surgical procedures, medications, allergies:    Reviewed      Recent Procedures/Tests/Findings:  Oncology History    2018: Left breast cT2 N1a, IDC, G1, +/+/-.        Malignant neoplasm of upper-outer quadrant of left breast in female, estrogen receptor positive (CMS-HCC)    06/07/2017 -  Presenting Symptoms     Left breast palpable UOQ mass         06/07/2017 Interval Scan(s)     Diagnostic MMG at [facility]: left breast UOQ mass difficult to measure, but appears to measure at least 5.6 cm.    [place]: left breast ill defined shadowing lesion at 2:00, 6 CFN measuring 3.7 x 2.6 x 2.5 cm.     Single mildly abnormal node in left axilla with constriction of fatty hilum.         06/13/2017 Biopsy     [facility] core biopsy of left breast 2:00, 6 CFN: IDC, G1, ER+(95%), PR+(30%), HER2 negative by FISH.  [facility] core biopsy left axillary LN: +LN metastasis.         06/13/2017 -  Other     Seen in [facility] ED following core biopsy in [facility] for axillary pain with non-expanding 4 cm hematoma noted in left axilla. No intervention required other than symptomatic measures (compression, pain management).         06/20/2017 Tumor Board     [facility] recs: left breast cT2 N1a IDC, G1, receptors pending. Per outside imaging there was a single abnormal LN found by [place] only. Awaiting receptors. Need SAVI into that clipped node. Discussed that she otherwise fits ACOSOG Z0011 criteria with negative clinical exam prior to biopsy and single abnormal LN on axillary [place]. Reasonable to do SLNB and excise biopsied node as well. If receptors favorable, consider surgery first approach, good BCT candidate. If high risk (TN, HER+) may need NACT approach. Briefly discussed ALTERNATE, but consensus was that she will likely be recommended for chemo given age and nodal positivity. Meeting surgery, rad/onc today. Will circle back with med/onc as needed.         07/03/2017 Surgery     Left [facility] localized/US guided partial mastectomy and left sentinel lymph node biopsy with targeted axillary lymph node excision: Invasive mammary carcinoma with mixed ductal and lobular features present in association with in situ carcinoma with mixed ductal and lobular features measuring 8.3 cm with positive inferior and lateral margin. Sentinel lymph node biopsy: 1/1 node positive for metastatic disease measuring 7 mm. No ECE. Palpable axillary lymph nodes: 4/4 lymph nodes with metastatic disease measuring 10 mm.  No ECE.  Additional palpable axillary lymph node 0/1 with  isolated tumor cells. SAVI scout in the axilla was defective and there was no localization signal from the SAVI probe.         08/14/2017 Surgery     Left re-excision, ALND w ARM: Lateral margin: Invasive carcinoma with mixed ductal and lobular features measuring 7mm w LVI, margin negative.   Inferior margin: Multiple foci of lymphovasular invasion present.  No evidence of stromal invasion (final margin negative)  Left ALND: ??Two of twenty-three lymph nodes positive for carcinoma (2/23).  Size of larger tumor deposit: 6 mm.  Extracapsular extension: Absent         09/21/2017 -  Chemotherapy     Started ddTaxol--->ddAC         09/21/2017 Adverse Reaction     First Taxol infused for ~20 minutes resulting in flushing with chest tightness and nausea.  Infusion was stopped and administered.  Symptoms subsided within 20 minutes but she continued to feel some chest tightness with deep inspiration.  Albuterol nebulizer was given.  She then returned to baseline and infusion was restarted at 1/2 rate and titrated up slowly.  She was able to complete infusion without any further events.          10/05/2017 -  Chemotherapy     Switching to Abraxane due to infusion reaction         10/11/2017 Adverse Reaction     Acute DVT Rt interval jugular, brachiocephalic, subclavian vein-now on Eliquis 5 mg qd.          12/05/2017 -  Chemotherapy     Chemotherapy Treatment    Treatment Goal Curative   Line of Treatment [No plan line of treatment]   Plan Name OP BREAST AC (DOSE DENSE)   Start Date 12/07/2017   End Date 01/19/2018 (Planned)   Provider Willeen Niece, MD   Chemotherapy dexamethasone (DECADRON) tablet 12 mg, 12 mg, Oral, Once, 2 of 4 cycles  Administration: 12 mg (12/07/2017), 12 mg (12/21/2017)  cyclophosphamide (CYTOXAN) 1,254 mg in sodium chloride (NS) 0.9 % 250 mL IVPB, 600 mg/m2 = 1,254 mg, Intravenous, Once, 2 of 4 cycles  Administration: 1,254 mg (12/07/2017), 1,254 mg (12/21/2017)  DOXOrubicin (ADRIAMYCIN) syringe 125.4 mg, 60 mg/m2 = 125.4 mg, Intravenous, Once, 2 of 4 cycles  Administration: 125.4 mg (12/07/2017), 125.4 mg (12/21/2017)                 OBJECTIVE:        Photos 12/24/17     Girth Measurements:      DYNAMIC GAIT INDEX    Grading: record the lowest category that applies.   1.Gait level surface: 3  Instructions: Walk at your normal speed from here to the next mark (20???).     (3) Normal: walks 20???, no assistive devices, good speed, no evidence for imbalance, normal gait pattern.   (2) Mild impairment: walks 20???, uses assistive devices, slower speed, mild gait deviations.   (1) Moderate impairment: walks 20???, slow speed, abnormal gait patters, evidence for imbalance.   (0) Severe impairment: cannot walk 20??? without assistance, severe gait deviations or imbalance.     2.Change in gait speed: 3  Instructions: Begin walking at your normal pace (for 5???), when I tell you ???go???, walk as fast as you can (for 5???). When I tell you ???slow???, walk as slowly as you can (for 5???).     (3) Normal: Able to smoothly change walking speed without loss of balance or gait deviation. Shows significant difference  in walking speeds between normal, fast and slow paces.   (2) Mild impairment: Is able to change speed but demonstrates mild gait deviations, or no gait deviations but unable to achieve a significant change in velocity, or uses as assistive device.   (1) Moderate impairment: Makes only minor adjustments to walking speed, or accomplishes a change in speed with significant gait deviations, or changes speed but loses balance but is able to recover and continue walking.   (0) Severe impairment: Cannot change speeds, or loss balance and has to reach for a wall or be caught.     3.Gait with horizontal head turns: 1  Instructions: Begin walking at your normal pace. When I tell you to ???look right???, keep walking straight, but turn your head to the right. Keep looking to the right until I tell you ???look left???, then keep walking straight and turn your head to the left. Keep your head to the left until I tell you, ???look straight???, then keep walking straight, but return your head to the centre.     (3) Normal: Performs head turns smoothly with no change in gait.   (2) Mild impairment: Performs head turns smoothly with slight change in gait velocity, i.e. minor disruption to smooth gait path or uses walking aid.   (1) Moderate impairment: Performs head turns with moderate change in gait velocity, slows down, staggers, but recovers, can continue to walk.   (0) Severe impairment: Performs task with severe disruption of gait, i.e. staggers outside 15??? path, loses balance, stops, reaches for wall.     4.Gait with vertical head turns:3  Instructions: Begin walking at your normal pace. When I tell you to ???look up???, keep walking straight, but tip your head and look up. Keep looking up until I tell you, ???look down???. Then keep walking straight and turn your head down. Keep looking down until I tell you, ??? look straight???, then keep walking straight, but return your head to the centre.     (3) Normal: Performs head turns smoothly with no change in gait.   (2) Mild impairment: Performs head turns smoothly with slight change in gait velocity, i.e. minor disruption to smooth gait path or uses walking aid.   (1) Moderate impairment: Performs head turns with moderate change in gait velocity, slows down, staggers, but recovers, can continue to walk.   (0) Severe impairment: Performs task with severe disruption of gait, i.e. staggers outside 15??? path, loses balance, stops, reaches for wall.     5.Gait and pivot turn: 1  Instructions: Begin walking at your normal pace. When I tell you, ???turn and stop???, turn as quickly as you can to face the opposite direction and stop.     (3) Normal: Pivot turns safely within 3 seconds and stops quickly with no loss of balance.   (2) Mild impairment: pivot turns safely in >3 seconds and stops with no loss of balance.   (1) Moderate impairment: Turns slowly, requires verbal cueing, requires several small steps to catch balance following turn and stop.   (0) Severe impairment: Cannot turn safely, requires assistance to turn and stop.     6.Step over obstacle: 2  Instructions: Begin walking at your normal speed. When you come to the shoebox, step over it, not around it, and keep walking.     (3) Normal: Is able to step over box without changing gait speed; no evidence for imbalance.   (2) Mild impairment: Is able to step over shoe box, but  must slow down and adjust steps to clear box safely.   (1) Moderate impairment: Is able to step over box but must stop, then step over. May require verbal cueing.   (0) Severe impairment: Cannot perform without assistance.     7.Step around obstacles: 3  Instructions: Begin walking at normal speed. When you come to the first cone (about 6??? away), walk around the right side of it. When you some to the second cone (6??? past first cone), walk around it to the left.     (3) Normal: Is able to walk safely around cones safely without changing gait speed; no evidence of imbalance.   (2) Mild impairment: Is able to step around both cones, but must slow down and adjust steps to clear cones.   (1) Moderate impairment: Is able to clear cones but must significantly slow speed to accomplish task, or requires verbal cueing.   (0) Severe impairment: Unable to clear cones, walks into one or both cones, or requires physical assistance.     8.Steps: 2 Instructions: Walk up these stairs as you would at home.(i.e. using a rail if necessary. At the top, turn around and walk down.     (3) Normal: Alternating feet, no rail.   (2) Mild impairment: Alternating feet, must use rail.   (1) Moderate impairment: Two feet to a stair, must use rail.   (0) Severe impairment: Cannot do safely.     TOTAL SCORE: 18/24  F:\[employer]\[facility] website\physiotherapy section\Dynamic Gait Index v.doc       Location of swelling:  L chest and axilla.     Skin: WFL except for the following:  increased skin thickness    Stemmer???s sign:no.      Sensation Intact to light touch:   Decreased sensation under axilla, around scar, and posterior lateral upper arm     Incision: healing appropriately for post op status, adhered to underlying tissue    Axillary Web Syndrome::yes     Posture/Observations:  Forward head, rounded shoulders        Range of Motion/Flexibilty:   UE WFL except  End range overhead activity.     Strength/MMT:   UE WFL     Total Treatment Time: 55 min        Treatment Rendered:      Manual therapy  X 45 min    - LUQ MLD.   Self care x10 min    - reviewed energy conservation tech with patient.          Today's Charges (noted here with $$):                          Data from the patient???s history and examination indicates the presence of 1-2 personal factors and/or comorbidities that will impact the plan of care for the current problem, including cancer history Examination of body systems includes 4 or more  body structures, functions, activity limitations and/or participation restrictions including integumentary system, lymphatic system, self care and scar mobility. The assessed clinical presentation is evolving based on  changing lymphatic status. These factors result in the need for moderate clinical decision making.    Equipment provided/recommended:   written HEP    Communication/consultation with other professionals:  Email to DME supplier [employer] re: garment needs        I attest that I have reviewed the above information.  Signed: Kirke Corin, PT   01/03/2018 1:07 PM

## 2018-01-05 NOTE — Unmapped (Signed)
Talked with patient. Reviewed her scans. CT was unremarkable. Port is drawing blood uneventfully. Went to PT on Thursday and swelling looks improved. Will continue to monitor the area and re-image as needed.

## 2018-01-10 DIAGNOSIS — C50412 Malignant neoplasm of upper-outer quadrant of left female breast: Principal | ICD-10-CM

## 2018-01-10 DIAGNOSIS — Z170 Estrogen receptor positive status [ER+]: Secondary | ICD-10-CM

## 2018-01-10 DIAGNOSIS — I890 Lymphedema, not elsewhere classified: Secondary | ICD-10-CM

## 2018-01-10 NOTE — Unmapped (Signed)
Marland Kitchen  OUTPATIENT PHYSICALTHERAPY  Upper Quadrant Lymphedema   Daily Note  Date:01/10/2018    Patient Name: Erika Cabrera  Date of Birth:1956/03/10  Session #:8  Diagnosis:   Encounter Diagnoses   Name Primary?   ??? Malignant neoplasm of upper-outer quadrant of left breast in female, estrogen receptor positive (CMS-HCC) Yes   ??? Lymphedema        Referring Provider: Ophelia Shoulder*    Initial Evaluation and POC certification : 10/25/17-01/23/18      Contraindications:  none    Precautions/Red Flags:  history of cancer      ASSESSMENT/Reason for Referral:    62 y.o. female presents with Stage  II  lymphedema secondary  In chest and under axilla.  Pt now with full ROM, however still demos tightness in axilla and chest worsened with overhead activity.  Cont to tolerate MLD well and swelling well managed.       Patient requires skilled Physical Therapy services  for the following problem list and secondary functional limitations:    Problem List:   Lack of knowledge of lymphedema and self care of this condition, Increased risk of infection, Lack of appropriate compression garment and Uncontrolled lymphedema swelling    Secondary Functional Limitations:  Decreased  ROM of arm and Scar/Myofascial  immobility    Barriers to learning:  no      Patient Goals: Decrease swelling, obtain a compression garment and decrease stiffness    Physical Therapy Goals:   In 3-4 sessions    Pt will have knowledge of proper skin care and lymphedema risk precautions to reduce risk of infection.  Patient will be able to properly demonstrate self-manual lymphatic drainage x1 in clinic to assist in management of lymphedema swelling  Pt. will independently don/doff and tolerate compression garment or compression bandages x1 in clinic  to ensure independent management of lymphedema.  Patient will demonstrate proper posture  to minimize pain and tightness in the affected quadrant.  MLD will be performed clinically to improve lymphangiomotoricity, thereby rerouting lymphatic flow to accomodate cancer treatment effects on the lymphatic system.    In 1-6 months:    Pt will be independent with all self care related to lymphedema.      Prognosis for goal achievement:   Good due to good motivation.      PLAN:    mechanics, taping, and pump/ equipment  recommendations     Planned frequency and duration  of treatment:    2x week for 2 weeks then 1x week for 6 weeks.        SUBJECTIVE:  Patient???s communication preference: verbal, written, visual, prn     Pt reports was able to tolerate her step son's wedding without sig complaints of fatigue. Reports currently with tightness in axilla.     0/10 Pain reported today.     Onset of swelling:  Since drains removed.          History of Present Illness/ Pt reports:   Pt reports recurrent Seroma since drains removed . Has been drained multiple times 1s time 360 ml, second time 310,  35 mil, 3rd time 55 ml, 4th time 48 ml.  Pt reports has not noticed swelling in extremity.  Some ROM  Deficits/ tightness noted.     Home environment:       Lives with family. Has two kids in college   Sleeping position: bed     Pt has caregiver available, capable and willing to help:yes  Occupation/ Activities/Recreation:        Occupational History   ??? Not on file       Prior Functional Status:   Independent    Current Functional Limitations: The patient reports the following limitations based on verbal description and a Quick DASH score of  which indicates 6% impairment of the upper extremity:  arm, shoulder, and hand pain    Sexual Dysfunction or Bowel or Bladder changes : none    History of infections: no    Previous Lymphedema Treatment Type: none    Current management: n/a       Medical hx/conditions/surgical procedures, medications, allergies:    Reviewed      Recent Procedures/Tests/Findings:  Oncology History    2018: Left breast cT2 N1a, IDC, G1, +/+/-.        Malignant neoplasm of upper-outer quadrant of left breast in female, estrogen receptor positive (CMS-HCC)    06/07/2017 -  Presenting Symptoms     Left breast palpable UOQ mass         06/07/2017 Interval Scan(s)     Diagnostic [facility] at [facility]: left breast UOQ mass difficult to measure, but appears to measure at least 5.6 cm.    [place]: left breast ill defined shadowing lesion at 2:00, 6 CFN measuring 3.7 x 2.6 x 2.5 cm.     Single mildly abnormal node in left axilla with constriction of fatty hilum.         06/13/2017 Biopsy     [facility] core biopsy of left breast 2:00, 6 CFN: IDC, G1, ER+(95%), PR+(30%), HER2 negative by FISH.  [facility] core biopsy left axillary LN: +LN metastasis.         06/13/2017 -  Other     Seen in [facility] ED following core biopsy in [facility] for axillary pain with non-expanding 4 cm hematoma noted in left axilla. No intervention required other than symptomatic measures (compression, pain management).         06/20/2017 Tumor Board     [facility] recs: left breast cT2 N1a IDC, G1, receptors pending. Per outside imaging there was a single abnormal LN found by [place] only. Awaiting receptors. Need SAVI into that clipped node. Discussed that she otherwise fits ACOSOG Z0011 criteria with negative clinical exam prior to biopsy and single abnormal LN on axillary [place]. Reasonable to do SLNB and excise biopsied node as well. If receptors favorable, consider surgery first approach, good BCT candidate. If high risk (TN, HER+) may need NACT approach. Briefly discussed ALTERNATE, but consensus was that she will likely be recommended for chemo given age and nodal positivity. Meeting surgery, rad/onc today. Will circle back with med/onc as needed.         07/03/2017 Surgery     Left [facility] localized/US guided partial mastectomy and left sentinel lymph node biopsy with targeted axillary lymph node excision: Invasive mammary carcinoma with mixed ductal and lobular features present in association with in situ carcinoma with mixed ductal and lobular features measuring 8.3 cm with positive inferior and lateral margin. Sentinel lymph node biopsy: 1/1 node positive for metastatic disease measuring 7 mm. No ECE. Palpable axillary lymph nodes: 4/4 lymph nodes with metastatic disease measuring 10 mm.  No ECE.  Additional palpable axillary lymph node 0/1 with isolated tumor cells. SAVI scout in the axilla was defective and there was no localization signal from the SAVI probe.         08/14/2017 Surgery     Left re-excision, ALND  w ARM: Lateral margin: Invasive carcinoma with mixed ductal and lobular features measuring 7mm w LVI, margin negative.   Inferior margin: Multiple foci of lymphovasular invasion present.  No evidence of stromal invasion (final margin negative)  Left ALND: ??Two of twenty-three lymph nodes positive for carcinoma (2/23).  Size of larger tumor deposit: 6 mm.  Extracapsular extension: Absent         09/21/2017 -  Chemotherapy     Started ddTaxol--->ddAC         09/21/2017 Adverse Reaction     First Taxol infused for ~20 minutes resulting in flushing with chest tightness and nausea.  Infusion was stopped and administered.  Symptoms subsided within 20 minutes but she continued to feel some chest tightness with deep inspiration.  Albuterol nebulizer was given.  She then returned to baseline and infusion was restarted at 1/2 rate and titrated up slowly.  She was able to complete infusion without any further events.          10/05/2017 -  Chemotherapy     Switching to Abraxane due to infusion reaction         10/11/2017 Adverse Reaction     Acute DVT Rt interval jugular, brachiocephalic, subclavian vein-now on Eliquis 5 mg qd.          12/05/2017 -  Chemotherapy     Chemotherapy Treatment    Treatment Goal Curative   Line of Treatment [No plan line of treatment]   Plan Name OP BREAST AC (DOSE DENSE)   Start Date 12/07/2017   End Date 01/19/2018 (Planned)   Provider Willeen Niece, MD   Chemotherapy dexamethasone (DECADRON) tablet 12 mg, 12 mg, Oral, Once, 2 of 4 cycles Administration: 12 mg (12/07/2017), 12 mg (12/21/2017)  cyclophosphamide (CYTOXAN) 1,254 mg in sodium chloride (NS) 0.9 % 250 mL IVPB, 600 mg/m2 = 1,254 mg, Intravenous, Once, 2 of 4 cycles  Administration: 1,254 mg (12/07/2017), 1,254 mg (12/21/2017)  DOXOrubicin (ADRIAMYCIN) syringe 125.4 mg, 60 mg/m2 = 125.4 mg, Intravenous, Once, 2 of 4 cycles  Administration: 125.4 mg (12/07/2017), 125.4 mg (12/21/2017)                 OBJECTIVE:        Photos 12/24/17     Girth Measurements:      DYNAMIC GAIT INDEX    Grading: record the lowest category that applies.   1.Gait level surface: 3  Instructions: Walk at your normal speed from here to the next mark (20???).     (3) Normal: walks 20???, no assistive devices, good speed, no evidence for imbalance, normal gait pattern.   (2) Mild impairment: walks 20???, uses assistive devices, slower speed, mild gait deviations.   (1) Moderate impairment: walks 20???, slow speed, abnormal gait patters, evidence for imbalance.   (0) Severe impairment: cannot walk 20??? without assistance, severe gait deviations or imbalance.     2.Change in gait speed: 3  Instructions: Begin walking at your normal pace (for 5???), when I tell you ???go???, walk as fast as you can (for 5???). When I tell you ???slow???, walk as slowly as you can (for 5???).     (3) Normal: Able to smoothly change walking speed without loss of balance or gait deviation. Shows significant difference in walking speeds between normal, fast and slow paces.   (2) Mild impairment: Is able to change speed but demonstrates mild gait deviations, or no gait deviations but unable to achieve a significant change in velocity, or  uses as assistive device.   (1) Moderate impairment: Makes only minor adjustments to walking speed, or accomplishes a change in speed with significant gait deviations, or changes speed but loses balance but is able to recover and continue walking.   (0) Severe impairment: Cannot change speeds, or loss balance and has to reach for a wall or be caught.     3.Gait with horizontal head turns: 1  Instructions: Begin walking at your normal pace. When I tell you to ???look right???, keep walking straight, but turn your head to the right. Keep looking to the right until I tell you ???look left???, then keep walking straight and turn your head to the left. Keep your head to the left until I tell you, ???look straight???, then keep walking straight, but return your head to the centre.     (3) Normal: Performs head turns smoothly with no change in gait.   (2) Mild impairment: Performs head turns smoothly with slight change in gait velocity, i.e. minor disruption to smooth gait path or uses walking aid.   (1) Moderate impairment: Performs head turns with moderate change in gait velocity, slows down, staggers, but recovers, can continue to walk.   (0) Severe impairment: Performs task with severe disruption of gait, i.e. staggers outside 15??? path, loses balance, stops, reaches for wall.     4.Gait with vertical head turns:3  Instructions: Begin walking at your normal pace. When I tell you to ???look up???, keep walking straight, but tip your head and look up. Keep looking up until I tell you, ???look down???. Then keep walking straight and turn your head down. Keep looking down until I tell you, ??? look straight???, then keep walking straight, but return your head to the centre.     (3) Normal: Performs head turns smoothly with no change in gait.   (2) Mild impairment: Performs head turns smoothly with slight change in gait velocity, i.e. minor disruption to smooth gait path or uses walking aid.   (1) Moderate impairment: Performs head turns with moderate change in gait velocity, slows down, staggers, but recovers, can continue to walk.   (0) Severe impairment: Performs task with severe disruption of gait, i.e. staggers outside 15??? path, loses balance, stops, reaches for wall.     5.Gait and pivot turn: 1  Instructions: Begin walking at your normal pace. When I tell you, ???turn and stop???, turn as quickly as you can to face the opposite direction and stop.     (3) Normal: Pivot turns safely within 3 seconds and stops quickly with no loss of balance.   (2) Mild impairment: pivot turns safely in >3 seconds and stops with no loss of balance.   (1) Moderate impairment: Turns slowly, requires verbal cueing, requires several small steps to catch balance following turn and stop.   (0) Severe impairment: Cannot turn safely, requires assistance to turn and stop.     6.Step over obstacle: 2  Instructions: Begin walking at your normal speed. When you come to the shoebox, step over it, not around it, and keep walking.     (3) Normal: Is able to step over box without changing gait speed; no evidence for imbalance.   (2) Mild impairment: Is able to step over shoe box, but must slow down and adjust steps to clear box safely.   (1) Moderate impairment: Is able to step over box but must stop, then step over. May require verbal cueing.   (0) Severe impairment: Cannot perform  without assistance.     7.Step around obstacles: 3  Instructions: Begin walking at normal speed. When you come to the first cone (about 6??? away), walk around the right side of it. When you some to the second cone (6??? past first cone), walk around it to the left.     (3) Normal: Is able to walk safely around cones safely without changing gait speed; no evidence of imbalance.   (2) Mild impairment: Is able to step around both cones, but must slow down and adjust steps to clear cones.   (1) Moderate impairment: Is able to clear cones but must significantly slow speed to accomplish task, or requires verbal cueing.   (0) Severe impairment: Unable to clear cones, walks into one or both cones, or requires physical assistance.     8.Steps: 2  Instructions: Walk up these stairs as you would at home.(i.e. using a rail if necessary. At the top, turn around and walk down.     (3) Normal: Alternating feet, no rail.   (2) Mild impairment: Alternating feet, must use rail.   (1) Moderate impairment: Two feet to a stair, must use rail.   (0) Severe impairment: Cannot do safely.     TOTAL SCORE: 18/24  F:\[employer]\[facility] website\physiotherapy section\Dynamic Gait Index v.doc       Location of swelling:  L chest and axilla.     Skin: WFL except for the following:  increased skin thickness    Stemmer???s sign:no.      Sensation Intact to light touch:   Decreased sensation under axilla, around scar, and posterior lateral upper arm     Incision: healing appropriately for post op status, adhered to underlying tissue    Axillary Web Syndrome::yes     Posture/Observations:  Forward head, rounded shoulders        Range of Motion/Flexibilty:   UE WFL except  End range overhead activity.     Strength/MMT:   UE WFL     Total Treatment Time: 55 min        Treatment Rendered:      Manual therapy  X 55 min    - LUQ MLD.   - cording stretch x 10 min           Today's Charges (noted here with $$):          Therapeutic Interventions  $$ Manual Therapy [mins]: 55               Equipment provided/recommended:   written HEP    Communication/consultation with other professionals:  Email to DME supplier [employer] re: garment needs        I attest that I have reviewed the above information.  Signed: Kirke Corin, PT   01/10/2018 2:03 PM

## 2018-01-11 ENCOUNTER — Encounter: Admit: 2018-01-11 | Discharge: 2018-01-11 | Payer: PRIVATE HEALTH INSURANCE

## 2018-01-11 ENCOUNTER — Encounter: Admit: 2018-01-11 | Discharge: 2018-01-11 | Payer: PRIVATE HEALTH INSURANCE | Attending: Family | Primary: Family

## 2018-01-11 DIAGNOSIS — C50412 Malignant neoplasm of upper-outer quadrant of left female breast: Principal | ICD-10-CM

## 2018-01-11 DIAGNOSIS — Z170 Estrogen receptor positive status [ER+]: Secondary | ICD-10-CM

## 2018-01-11 DIAGNOSIS — R32 Unspecified urinary incontinence: Secondary | ICD-10-CM

## 2018-01-11 LAB — CBC W/ AUTO DIFF
BASOPHILS ABSOLUTE COUNT: 0 10*9/L (ref 0.0–0.1)
BASOPHILS RELATIVE PERCENT: 1.1 %
EOSINOPHILS RELATIVE PERCENT: 0.3 %
HEMOGLOBIN: 12.4 g/dL (ref 12.0–16.0)
LARGE UNSTAINED CELLS: 4 % (ref 0–4)
LYMPHOCYTES ABSOLUTE COUNT: 1 10*9/L — ABNORMAL LOW (ref 1.5–5.0)
LYMPHOCYTES RELATIVE PERCENT: 24 %
MEAN CORPUSCULAR HEMOGLOBIN CONC: 33.9 g/dL (ref 31.0–37.0)
MEAN CORPUSCULAR HEMOGLOBIN: 29.1 pg (ref 26.0–34.0)
MEAN CORPUSCULAR VOLUME: 85.7 fL (ref 80.0–100.0)
MEAN PLATELET VOLUME: 8.1 fL (ref 7.0–10.0)
MONOCYTES RELATIVE PERCENT: 11.6 %
NEUTROPHILS ABSOLUTE COUNT: 2.4 10*9/L (ref 2.0–7.5)
NEUTROPHILS RELATIVE PERCENT: 59.1 %
PLATELET COUNT: 243 10*9/L (ref 150–440)
RED BLOOD CELL COUNT: 4.28 10*12/L (ref 4.00–5.20)
RED CELL DISTRIBUTION WIDTH: 17.6 % — ABNORMAL HIGH (ref 12.0–15.0)
WBC ADJUSTED: 4.1 10*9/L — ABNORMAL LOW (ref 4.5–11.0)

## 2018-01-11 LAB — URINALYSIS WITH CULTURE REFLEX
BACTERIA: NONE SEEN /HPF
BILIRUBIN UA: NEGATIVE
GLUCOSE UA: NEGATIVE
KETONES UA: NEGATIVE
LEUKOCYTE ESTERASE UA: NEGATIVE
NITRITE UA: NEGATIVE
PH UA: 5 (ref 5.0–9.0)
SQUAMOUS EPITHELIAL: <1 /HPF (ref 0–5)
UROBILINOGEN UA: 0.2
WBC UA: 1 /HPF (ref 0–5)

## 2018-01-11 LAB — HEPATIC FUNCTION PANEL
ALKALINE PHOSPHATASE: 44 U/L (ref 38–126)
ALT (SGPT): 49 U/L — ABNORMAL HIGH (ref 15–48)
AST (SGOT): 36 U/L (ref 14–38)
BILIRUBIN TOTAL: 0.4 mg/dL (ref 0.0–1.2)

## 2018-01-11 LAB — ALKALINE PHOSPHATASE: Alkaline phosphatase:CCnc:Pt:Ser/Plas:Qn:: 44

## 2018-01-11 LAB — MEAN CORPUSCULAR VOLUME: Lab: 85.7

## 2018-01-11 LAB — RBC UA: Lab: <1

## 2018-01-11 NOTE — Unmapped (Signed)
If you feel like this is an emergency please call 911.  For appointments or questions Monday through Friday 8AM-5PM please call [phone] or Toll Free [phone]. For Medical questions or concerns ask for the Nurse Triage Line.  On Nights, Weekends, and Holidays call [phone] and ask for the Oncologist on Call.  Reasons to call the Nurse Triage Line:  Fever of 100.5 or greater  Nausea and/or vomiting not relived with nausea medicine  Diarrhea or constipation  Severe pain not relieved with usual pain regimen  Shortness of breath  Uncontrolled bleeding  Mental status changes    Office Visit on 01/11/2018   Component Date Value Ref Range Status   ??? Color, UA 01/11/2018 Yellow   Final   ??? Clarity, UA 01/11/2018 Clear   Final   ??? Specific Gravity, UA 01/11/2018 1.022  1.003 - 1.030 Final   ??? pH, UA 01/11/2018 5.0  5.0 - 9.0 Final   ??? Leukocyte Esterase, UA 01/11/2018 Negative  Negative Final   ??? Nitrite, UA 01/11/2018 Negative  Negative Final   ??? Protein, UA 01/11/2018 Trace* Negative Final   ??? Glucose, UA 01/11/2018 Negative  Negative Final   ??? Ketones, UA 01/11/2018 Negative  Negative Final   ??? Urobilinogen, UA 01/11/2018 0.2 mg/dL  0.2 mg/dL, 1.0 mg/dL Final   ??? Bilirubin, UA 01/11/2018 Negative  Negative Final   ??? Blood, UA 01/11/2018 Negative  Negative Final   ??? RBC, UA 01/11/2018 <1  <=4 /HPF Final   ??? WBC, UA 01/11/2018 1  0 - 5 /HPF Final   ??? Squam Epithel, UA 01/11/2018 <1  0 - 5 /HPF Final   ??? Bacteria, UA 01/11/2018 None Seen  None Seen /HPF Final   ??? Mucus, UA 01/11/2018 Few* None Seen /HPF Final   Lab on 01/11/2018   Component Date Value Ref Range Status   ??? Albumin 01/11/2018 3.9  3.5 - 5.0 g/dL Final   ??? Total Protein 01/11/2018 6.2* 6.5 - 8.3 g/dL Final   ??? Total Bilirubin 01/11/2018 0.4  0.0 - 1.2 mg/dL Final   ??? Bilirubin, Direct 01/11/2018 <0.10  0.00 - 0.40 mg/dL Final   ??? AST 57/84/6962 36  14 - 38 U/L Final   ??? ALT 01/11/2018 49* 15 - 48 U/L Final   ??? Alkaline Phosphatase 01/11/2018 44 38 - 126 U/L Final   ??? WBC 01/11/2018 4.1* 4.5 - 11.0 10*9/L Final   ??? RBC 01/11/2018 4.28  4.00 - 5.20 10*12/L Final   ??? HGB 01/11/2018 12.4  12.0 - 16.0 g/dL Final   ??? HCT 95/28/4132 36.6  36.0 - 46.0 % Final   ??? MCV 01/11/2018 85.7  80.0 - 100.0 fL Final   ??? MCH 01/11/2018 29.1  26.0 - 34.0 pg Final   ??? MCHC 01/11/2018 33.9  31.0 - 37.0 g/dL Final   ??? RDW 44/08/270 17.6* 12.0 - 15.0 % Final   ??? MPV 01/11/2018 8.1  7.0 - 10.0 fL Final   ??? Platelet 01/11/2018 243  150 - 440 10*9/L Final   ??? Neutrophils % 01/11/2018 59.1  % Final   ??? Lymphocytes % 01/11/2018 24.0  % Final   ??? Monocytes % 01/11/2018 11.6  % Final   ??? Eosinophils % 01/11/2018 0.3  % Final   ??? Basophils % 01/11/2018 1.1  % Final   ??? Absolute Neutrophils 01/11/2018 2.4  2.0 - 7.5 10*9/L Final   ??? Absolute Lymphocytes 01/11/2018 1.0* 1.5 - 5.0 10*9/L Final   ???  Absolute Monocytes 01/11/2018 0.5  0.2 - 0.8 10*9/L Final   ??? Absolute Eosinophils 01/11/2018 0.0  0.0 - 0.4 10*9/L Final   ??? Absolute Basophils 01/11/2018 0.0  0.0 - 0.1 10*9/L Final   ??? Large Unstained Cells 01/11/2018 4  0 - 4 % Final   ??? Anisocytosis 01/11/2018 Slight* Not Present Final

## 2018-01-11 NOTE — Unmapped (Addendum)
Breast Cancer Return Patient Evaluation:     Referring Physician: Cala Bradford, Md  [address]  [facility] [place], [place] [address].     PCP: Stacie Acres WHITE, MD    Assessment/Plan:  I spent at least 40 minutes with this patient more than 50% in counseling. The following issues were discussed:    A 61yo woman with a Left breast pT3pN2a, IDC, G2, ER+(95%)/PR+(30%)/HER2-neg, 5/6 LN positive, +LVI and positive surgical margins s/p BCT with targeted axillary LN bx on 07/03/17. Unfortunately her pathology revealed residual disease and she requires re-excision with ALND. Completed this on 08/14/17 showing 7 mm of residual tumor, negative margins, 2/23 +LN with larger tumor deposit of 6 mm.    Planned for ddAC-T but H/O neuropathy; making weekly paclitaxel challenging so tried q 2 week taxol. In her, we started with her taxane because of wound healing and inability to get a port. However, she had a hypesensitivity reaction  into Taxol infusion and was able to complete at a slower rate.  Accordingly, switched to q3w Abraxane. Now receiving ddAC. Today, she is reporting acute symptoms of urinary incontinence and numbness around the anus. She is still continent of stool, as a matter of fact she has been very constipated recently. Her exam only showed external hemorrhoids without no significant paresthesias. Never the less, she is very high risk for PD to the lumbar spine therefore will send for for MRI imaging. Also plan to collect a urinalysis today to R/O a UTI, which could explain her recent incontinence.      I spent at least 40 minutes with this patient more than 50% in counseling. The following issues were discussed:     - Plan: Commence to C3 ddAC, labs adequate  - Cardiac monitoring: Echo 07/27/17 EF >55%  - Antiestrogen therapy (AI) for 5-18yrs  - CT-CAP 07/25/17 NED  - Neuropathy: persistent but improved with gabapentin-doesn't want anymore medicine changes    Declined Cymbalta/Lyrica  - Bone pain with Neulasta with 1st Cycle of ddTaxol    Udenyca with Claritin 3 days     Can also use otc Tylenol/Oxycodone prn for Neulasta bone pain  - Nausea with every cycle of ddAC, relieved with Zofran  - Urinary incontinence: unknown etiology, glucose is normal, check UA to R/O UTI  - Saddle numbness: unknown etiology, send for lumbar MRI, R/O bulging disc vs spinal nerve impingement vs PD  ---->On a side note, patient suffering for resolving constipation with external hemorrhoids on exam. Numbness could be from active internal hemorrhoids. Recommended she try external Prep H.     RTC in 2 weeks for C4 ddAC with Udenyca    Reason for Visit: A 62 y.o. female with breast cancer   Receiving adjuvant chemotherapy for her pT3pN2a HR+ Her2 negative left sided breast cancer.    10/11/17 She initially presented with self-palpated left breast mass and workup/surgery revealed an 8.3cm tumor HR+/HER2-neg with positive LNs. She reported baseline neuropathy in her feet and is followed by a Neuroloogist in [place].  She does not have diabetes and states that the neuropathy is due to structural issues with her feet and high arches and runs in her family. She has no history of heart issues, never had an MI or heart failure. She recently moved to [facility] from [place].  She was laid off from her job and was working part-time but could not qualify for [employer] on the open market so  she had to quit her part-time job.      Interval History:  Here with sister.  Swelling over the RC fossa area improved.  Chest CT was normal and on today's visit she states the swelling has resolved.  Lymphedema is getting better.  Neuropathy waxes and wanes on Neurontin 300 mg TID, does not want to try Cymbalta/Lyrica.  Feels some numbness in the rectum x 1 week, ever since her constipation flared-up.   Incontinent of urine since this morning, no dysuria or urinary frequency.   Has had 4 other episodes over the past 2 months.  No chronic degenerative disc disease. Does have some intermittent LBP.   Additionally, reporting mild to moderate appetite changes, constipation, CIPN, nausea, HF, insomnia with mouth sores there are intermittent.  Performance status=1    Breast Cancer History:   Oncology History    2018: Left breast cT2 N1a, IDC, G1, +/+/-.        Malignant neoplasm of upper-outer quadrant of left breast in female, estrogen receptor positive (CMS-HCC)    06/07/2017 -  Presenting Symptoms     Left breast palpable UOQ mass         06/07/2017 Interval Scan(s)     Diagnostic MMG at [facility]: left breast UOQ mass difficult to measure, but appears to measure at least 5.6 cm.    [place]: left breast ill defined shadowing lesion at 2:00, 6 CFN measuring 3.7 x 2.6 x 2.5 cm.     Single mildly abnormal node in left axilla with constriction of fatty hilum.         06/13/2017 Biopsy     [facility] core biopsy of left breast 2:00, 6 CFN: IDC, G1, ER+(95%), PR+(30%), HER2 negative by FISH.  [facility] core biopsy left axillary LN: +LN metastasis.         06/13/2017 -  Other     Seen in [facility] ED following core biopsy in [facility] for axillary pain with non-expanding 4 cm hematoma noted in left axilla. No intervention required other than symptomatic measures (compression, pain management).         06/20/2017 Tumor Board     [facility] recs: left breast cT2 N1a IDC, G1, receptors pending. Per outside imaging there was a single abnormal LN found by [place] only. Awaiting receptors. Need SAVI into that clipped node. Discussed that she otherwise fits ACOSOG Z0011 criteria with negative clinical exam prior to biopsy and single abnormal LN on axillary [place]. Reasonable to do SLNB and excise biopsied node as well. If receptors favorable, consider surgery first approach, good BCT candidate. If high risk (TN, HER+) may need NACT approach. Briefly discussed ALTERNATE, but consensus was that she will likely be recommended for chemo given age and nodal positivity. Meeting surgery, rad/onc today. Will circle back with med/onc as needed.         07/03/2017 Surgery     Left [facility] localized/US guided partial mastectomy and left SLNbx with ALNDx: Invasive mammary carcinoma with mixed ductal and lobular features present in association with in situ carcinoma with mixed ductal and lobular features measuring 8.3 cm with positive inferior and lateral margin. Sentinel lymph node biopsy: 1/1 node positive for metastatic disease measuring 7 mm. No ECE. Palpable axillary lymph nodes: 4/4 lymph nodes with metastatic disease measuring 10 mm.  No ECE.  Additional palpable axillary lymph node 0/1 with isolated tumor cells. SAVI scout in the axilla was defective and there was no localization signal from the SAVI probe.  08/14/2017 Surgery     Left re-excision, ALND w ARM: Lateral margin: Invasive carcinoma with mixed ductal and lobular features measuring 7mm w LVI, margin negative.   Inferior margin: Multiple foci of lymphovasular invasion present.  No evidence of stromal invasion (final margin negative)  Left ALND: ??Two of twenty-three lymph nodes positive for carcinoma (2/23).  Size of larger tumor deposit: 6 mm.  Extracapsular extension: Absent         09/21/2017 -  Chemotherapy     Started ddTaxol--->ddAC         09/21/2017 Adverse Reaction     First Taxol infused for ~20 minutes resulting in flushing with chest tightness and nausea.  Infusion was stopped and administered.  Symptoms subsided within 20 minutes but she continued to feel some chest tightness with deep inspiration.  Albuterol nebulizer was given.  She then returned to baseline and infusion was restarted at 1/2 rate and titrated up slowly.  She was able to complete infusion without any further events.          10/05/2017 -  Chemotherapy     Switching to Abraxane due to infusion reaction         10/11/2017 Adverse Reaction     Acute DVT Rt interval jugular, brachiocephalic, subclavian vein-now on Eliquis 5 mg qd. 12/05/2017 -  Chemotherapy     Chemotherapy Treatment    Treatment Goal Curative   Line of Treatment [No plan line of treatment]   Plan Name OP BREAST AC (DOSE DENSE)   Start Date 12/07/2017   End Date 01/26/2018 (Planned)   Provider Willeen Niece, MD   Chemotherapy dexamethasone (DECADRON) tablet 12 mg, 12 mg, Oral, Once, 3 of 4 cycles  Administration: 12 mg (12/07/2017), 12 mg (12/21/2017)  cyclophosphamide (CYTOXAN) 1,254 mg in sodium chloride (NS) 0.9 % 250 mL IVPB, 600 mg/m2 = 1,254 mg, Intravenous, Once, 3 of 4 cycles  Administration: 1,254 mg (12/07/2017), 1,254 mg (12/21/2017)  DOXOrubicin (ADRIAMYCIN) syringe 125.4 mg, 60 mg/m2 = 125.4 mg, Intravenous, Once, 3 of 4 cycles  Administration: 125.4 mg (12/07/2017), 125.4 mg (12/21/2017)                   Past Medical History:  Patient Active Problem List   Diagnosis   ??? Malignant neoplasm of upper-outer quadrant of left breast in female, estrogen receptor positive (CMS-HCC)       Gyn History:   LMP 2012    Surgical History:   Past Surgical History:   Procedure Laterality Date   ??? ANAL FISSURECTOMY     ??? BREAST BIOPSY Left 06/2017    malignant   ??? BREAST BIOPSY Left 06/2017    Lympnode bx-positive   ??? IR INSERT PORT AGE GREATER THAN 5 YRS  09/28/2017    IR INSERT PORT AGE GREATER THAN 5 YRS 09/28/2017 Rush Barer, MD IMG VIR [facility]   ??? PR BX/REMV,LYMPH NODE,DEEP AXILL Left 07/03/2017    Procedure: BX/EXC LYMPH NODE; OPEN, DEEP AXILRY NODE;  Surgeon: Talbert Cage, DO;  Location: ASC OR [facility];  Service: Surgical Oncology   ??? PR INTRAOPERATIVE SENTINEL LYMPH NODE ID W DYE INJECTION Left 07/03/2017    Procedure: INTRAOPERATIVE IDENTIFICATION SENTINEL LYMPH NODE(S) INCLUDE INJECTION NON-RADIOACTIVE DYE, WHEN PERFORMED;  Surgeon: Talbert Cage, DO;  Location: ASC OR [facility];  Service: Surgical Oncology   ??? PR INTRAOPERATIVE SENTINEL LYMPH NODE ID W DYE INJECTION Left 08/14/2017    Procedure: INTRAOPERATIVE IDENTIFICATION SENTINEL LYMPH NODE(S) INCLUDE INJECTION  NON-RADIOACTIVE DYE, WHEN PERFORMED;  Surgeon: Talbert Cage, DO;  Location: ASC OR [facility];  Service: Surgical Oncology Breast   ??? PR MASTECTOMY, PARTIAL Left 07/03/2017    Procedure: MASTECTOMY, savi scout PARTIAL (EG, LUMPECTOMY, TYLECTOMY, QUADRANTECTOMY, SEGMENTECTOMY);  Surgeon: Talbert Cage, DO;  Location: ASC OR [facility];  Service: Surgical Oncology   ??? PR MASTECTOMY, PARTIAL Left 08/14/2017    Procedure: MASTECTOMY, PARTIAL (EG, LUMPECTOMY, TYLECTOMY, QUADRANTECTOMY, SEGMENTECTOMY) Re-excision;  Surgeon: Talbert Cage, DO;  Location: ASC OR [facility];  Service: Surgical Oncology Breast   ??? PR REMOVE ARMPITS LYMPH NODES COMPLT Left 08/14/2017    Procedure: AXILLARY LYMPHADENECTOMY; COMPLETE;  Surgeon: Talbert Cage, DO;  Location: ASC OR [facility];  Service: Surgical Oncology Breast       Medications:  Current Outpatient Medications   Medication Sig Dispense Refill   ??? acetaminophen (TYLENOL) 500 MG tablet Take 1,000 mg by mouth every four (4) hours as needed for pain.     ??? apixaban (ELIQUIS) 5 mg Tab Take 1 tablet (5 mg total) by mouth Two (2) times a day. 60 tablet 5   ??? buPROPion (WELLBUTRIN XL) 300 MG 24 hr tablet Take 300 mg by mouth daily.     ??? dexamethasone (DECADRON) 4 MG tablet Take 2 tablets (8 mg total) by mouth daily. Take on Days 2, 3, and 4. 24 tablet 0   ??? docusate sodium (COLACE) 100 MG capsule Take 100 mg by mouth daily with breakfast.     ??? FLUoxetine (PROZAC) 10 MG tablet Take 10 mg by mouth daily.     ??? GABAPENTIN ORAL Take 900 mg by mouth nightly. Pt taking 1 capsule at noon and 3 capsules at 8PM     ??? lidocaine-diphenhydramine-aluminum-magnesium (FIRST-MOUTHWASH BLM) 200-25-400-40 mg/30 mL Mwsh 10 mL by Mouth route 4 (four) times a day as needed. 240 mL 1   ??? loratadine (CLARITIN ORAL) Take by mouth.     ??? LORazepam (ATIVAN) 0.5 MG tablet Take 1 tablet (0.5 mg total) by mouth nightly as needed (take for difficulty sleeping on nights taking dexamethasone). 30 tablet 0   ??? ondansetron (ZOFRAN) 4 MG tablet Take 2 tablets (8 mg total) by mouth every eight (8) hours as needed for nausea. 30 tablet 2   ??? polyethylene glycol (MIRALAX) 17 gram packet Take 17 g by mouth daily.     ??? ranitidine (ZANTAC) 150 MG tablet Take 150 mg by mouth daily.      ??? ondansetron (ZOFRAN) 8 MG tablet Take 1 tablet (8 mg total) by mouth every twelve (12) hours as needed for nausea. 30 tablet 2   ??? prochlorperazine (COMPAZINE) 10 MG tablet Take 1 tablet (10 mg total) by mouth every six (6) hours as needed (nausea). (Patient not taking: Reported on 01/11/2018) 30 tablet 2     No current facility-administered medications for this visit.      Facility-Administered Medications Ordered in Other Visits   Medication Dose Route Frequency Provider Last Rate Last Dose   ??? dexamethasone (DECADRON) 4 mg/mL injection 12 mg  12 mg Intravenous Once PRN Willeen Niece, MD       ??? heparin, porcine (PF) 100 unit/mL injection 500 Units  500 Units Intravenous Q30 Min PRN Marcy Panning, [place]   500 Units at 01/11/18 1212   ??? OKAY TO SEND MEDICATION/CHEMOTHERAPY TO UNIT   Other Once Willeen Niece, MD       ??? ondansetron [facility]) injection 8 mg  8 mg Intravenous  Once PRN Willeen Niece, MD       ??? sodium chloride (NS) 0.9 % infusion  100 mL/hr Intravenous Continuous Willeen Niece, MD   Stopped at 01/11/18 1211       Allergies:  Allergies   Allergen Reactions   ??? Erythromycin Diarrhea   ??? Penicillins Rash       Personal and Social History:  Social History     Socioeconomic History   ??? Marital status: Married     Spouse name: Not on file   ??? Number of children: Not on file   ??? Years of education: Not on file   ??? Highest education level: Not on file   Occupational History   ??? Not on file   Social Needs   ??? Financial resource strain: Not on file   ??? Food insecurity:     Worry: Not on file     Inability: Not on file   ??? Transportation needs:     Medical: Not on file     Non-medical: Not on file   Tobacco Use   ??? Smoking status: Never Smoker   ??? Smokeless tobacco: Never Used   Substance and Sexual Activity   ??? Alcohol use: No   ??? Drug use: No   ??? Sexual activity: Not on file   Lifestyle   ??? Physical activity:     Days per week: Not on file     Minutes per session: Not on file   ??? Stress: Not on file   Relationships   ??? Social connections:     Talks on phone: Not on file     Gets together: Not on file     Attends religious service: Not on file     Active member of club or organization: Not on file     Attends meetings of clubs or organizations: Not on file     Relationship status: Not on file   Other Topics Concern   ??? Not on file   Social History Narrative    The patient is married. She lives at home with her husband, Ed.  She has two children       Family History:  Cancer-related family history includes Breast cancer in her paternal aunt. There is no history of Cancer or Ovarian cancer.  indicated that the status of her mother is unknown. She indicated that the status of her father is unknown. She indicated that the status of her sister is unknown. She indicated that the status of her maternal grandmother is unknown. She indicated that the status of her maternal grandfather is unknown. She indicated that the status of her paternal grandmother is unknown. She indicated that the status of her paternal grandfather is unknown. She indicated that the status of her daughter is unknown. She indicated that the status of her neg hx is unknown.      Review of Systems: A complete review of systems was obtained including: Constitutional, Eyes, ENT, Cardiovascular, Respiratory, GI, GU, Musculoskeletal, Skin, Neurological, Psychiatric, Endocrine, Heme/Lymphatic, and Allergic/Immunologic systems. It is negative or non-contributory to the patient???s management except for the following: None    Physical Examination:  Vital Signs: BP 115/64  - Pulse 83  - Temp 36.9 ??C (98.4 ??F) (Oral)  - Resp 18  - Wt 86.3 kg (190 lb 4.8 oz)  - SpO2 98%  - BMI 29.52 kg/m??   General:  Healthy-appearing female in no acute distress..  Cardiovascular:  Heart not clinically enlarged.  No significant murmurs or gallops.  No lower extremity edema.    Respiratory:  Chest clear to percussion and auscultation.   Gastrointestinal:  Abdomen soft without masses and tenderness, no hepatosplenomegaly or other masses. External rectal exam with numerous external hemorrhoids which are TTP.    Musculoskeletal:  No bony pain or tenderness.   Skin and Subcutaneous Tissues:  No rash, ecchymoses, purpuric lesions noted.   Breasts:  Left breast with well healing scar.  Mild swelling lateral left breast+resolving seroma. Resolving erythema, no fluctuance. Left axilla tender with well healed scar. R breast normal appearing.  Psychiatric:  Alert and oriented.  Mood is normal.  No other symptoms.   Heme/Lymphatic/Immunologic:  No cervical or axillary adenopathy.   Upper Extremity Lymphedema: None.   Neuro: A & O x3. ??  Hearing intact to finger rub bilaterally. ??  Peripheral fields intact to confrontation bilaterally. ??  EOMI, no nystagmus. PERRLA. ??  Facial sensation intact and equal bilaterally. Facial muscles intact and symmetric. Swallowing intact, uvula rises midline, tongue protrudes midline. ??  Shoulder shrug, head movement intact and equal bilaterally. ??  No tremors. Motor strength 5/5 upper and lower extremeties, bilaterally.   Gait smooth and coordinated. ??  RAM and finger-to-nose smoothly intact. ??  Sensation intact to light touch, equal and symmetric.

## 2018-01-11 NOTE — Unmapped (Signed)
Pt arrived to chair at 0955 for C3D1 [facility]. Pt denies any issues today, awake and alert, NAD, VSS. Premedications and pre-hydration administered per treatment plan, see eMAR. Doxorubicin administered IV push at 1123 without complication; blood return verified per protocol without incident. Cytoxan infusion started at 1135; completed at 1207 without complication. AVS given and reviewed. Pt denies any questions or concerns; discharged home ambulatory at 1215.

## 2018-01-11 NOTE — Unmapped (Signed)
Patient labs drawn and sent for analysis. Care per Liz Beach

## 2018-01-11 NOTE — Unmapped (Signed)
It was a pleasure seeing you today.   Continue with the current plan of care.  Follow-up in 2 weeks.    No orders of the defined types were placed in this encounter.      Labs from today if done:  Lab on 01/11/2018   Component Date Value   ??? WBC 01/11/2018 4.1*   ??? RBC 01/11/2018 4.28    ??? HGB 01/11/2018 12.4    ??? HCT 01/11/2018 36.6    ??? MCV 01/11/2018 85.7    ??? [facility] 01/11/2018 29.1    ??? MCHC 01/11/2018 33.9    ??? RDW 01/11/2018 17.6*   ??? MPV 01/11/2018 8.1    ??? Platelet 01/11/2018 243    ??? Neutrophils % 01/11/2018 59.1    ??? Lymphocytes % 01/11/2018 24.0    ??? Monocytes % 01/11/2018 11.6    ??? Eosinophils % 01/11/2018 0.3    ??? Basophils % 01/11/2018 1.1    ??? Absolute Neutrophils 01/11/2018 2.4    ??? Absolute Lymphocytes 01/11/2018 1.0*   ??? Absolute Monocytes 01/11/2018 0.5    ??? Absolute Eosinophils 01/11/2018 0.0    ??? Absolute Basophils 01/11/2018 0.0    ??? Large Unstained Cells 01/11/2018 4    ??? Anisocytosis 01/11/2018 Slight*        Future Appointments:  Future Appointments   Date Time Provider Department Center   01/11/2018  8:30 AM Marcy Panning, FNP [facility]   01/11/2018  9:30 AM ONCINF CHAIR 38 [id number]   01/17/2018 10:30 AM Sherin Shelah Lewandowsky, PT [facility]   01/25/2018  7:30 AM ADULT ONC LAB [facility]   01/25/2018  8:30 AM Marcy Panning, FNP [id number]   01/25/2018  9:30 AM ONCDEV CHAIR 52 [id number]   01/31/2018 10:30 AM Sherin Shelah Lewandowsky, PT [facility] [place]       For appointments call [phone]  For new symptoms or health related issues, please call   Nurse Navigator: [name], RN [phone]  On weekends and after hours on weekdays, please call [phone] for the oncology fellow on call.      If you use my[facility]chart, please know that I check messages only twice a week. If your message is urgent, please call or page your nurse navigator or the on-call fellow.

## 2018-01-13 ENCOUNTER — Encounter: Admit: 2018-01-13 | Discharge: 2018-01-14 | Payer: PRIVATE HEALTH INSURANCE

## 2018-01-13 DIAGNOSIS — C50412 Malignant neoplasm of upper-outer quadrant of left female breast: Principal | ICD-10-CM

## 2018-01-13 DIAGNOSIS — Z170 Estrogen receptor positive status [ER+]: Secondary | ICD-10-CM

## 2018-01-13 NOTE — Unmapped (Signed)
Patient here for Neulasta injection. Neulasta given, reviewed plan of care and follow up schedule. Patient ambulatory at the time of discharge.

## 2018-01-13 NOTE — Unmapped (Signed)
Patient called, she realized overnight she had not gotten her Neulasta injection on Saturday 01/12/18 after receiving chemo on 01/11/18. Appt was not scheduled. Scheduled patient to come in this morning and reasseured her that medication was effective to receive today. RN additionally reviewed upcoming treatment schedule and added additional appt for injection in one week after next/last treatment.

## 2018-01-17 ENCOUNTER — Encounter: Admit: 2018-01-17 | Discharge: 2018-01-18 | Payer: PRIVATE HEALTH INSURANCE

## 2018-01-17 DIAGNOSIS — C50412 Malignant neoplasm of upper-outer quadrant of left female breast: Principal | ICD-10-CM

## 2018-01-17 DIAGNOSIS — R32 Unspecified urinary incontinence: Secondary | ICD-10-CM

## 2018-01-17 DIAGNOSIS — Z170 Estrogen receptor positive status [ER+]: Secondary | ICD-10-CM

## 2018-01-17 DIAGNOSIS — I890 Lymphedema, not elsewhere classified: Secondary | ICD-10-CM

## 2018-01-17 NOTE — Unmapped (Signed)
[facility]  OUTPATIENT PHYSICALTHERAPY  Upper Quadrant Lymphedema   Progress Note  Date:01/17/2018    Patient Name: Erika Cabrera  Date of Birth:03-Mar-1956  Session #:9  Diagnosis:   Encounter Diagnoses   Name Primary?   ??? Malignant neoplasm of upper-outer quadrant of left breast in female, estrogen receptor positive (CMS-HCC) Yes   ??? Lymphedema        Referring Provider: Ophelia Shoulder*    Initial Evaluation and POC certification : 10/25/17-01/23/18      Contraindications:  none    Precautions/Red Flags:  history of cancer      ASSESSMENT/Reason for Referral:    62 y.o. female presents with Stage  II  lymphedema secondary in chest and under axilla. Pt fatigued with chemo. Volumes indicate no sig swelling at this time. Recommending we see patient 1x week for 4-5 weeks during radiation to promote lymphatic drainage while skin being radiated.  Pt currently with full ROM and no longer  Bound down by cording. All cording either met or progressing ( see below).       Patient requires skilled Physical Therapy services  for the following problem list and secondary functional limitations:    Problem List:   Lack of knowledge of lymphedema and self care of this condition, Increased risk of infection, Lack of appropriate compression garment and Uncontrolled lymphedema swelling    Secondary Functional Limitations:  Decreased  ROM of arm and Scar/Myofascial  immobility    Barriers to learning:  no      Patient Goals: Decrease swelling, obtain a compression garment and decrease stiffness    Physical Therapy Goals:   In 3-4 sessions    Pt will have knowledge of proper skin care and lymphedema risk precautions to reduce risk of infection.   Met 01/17/18  Patient will be able to properly demonstrate self-manual lymphatic drainage x1 in clinic to assist in management of lymphedema swelling progressing- needs review 01/17/18   Pt. will independently don/doff and tolerate compression garment or compression bandages x1 in clinic  to ensure independent management of lymphedema.  Progressing - will wait until after radiation to assess need of compression bra.   Patient will demonstrate proper posture  to minimize pain and tightness in the affected quadrant.  Progressing 01/17/18  MLD will be performed clinically to improve lymphangiomotoricity, thereby rerouting lymphatic flow to accomodate cancer treatment effects on the lymphatic system. Met 01/17/18    In 1-6 months:    Pt will be independent with all self care related to lymphedema.  Progressing 01/17/18       Prognosis for goal achievement:   Good due to good motivation.      PLAN:    mechanics, taping, and pump/ equipment  recommendations     Planned frequency and duration  of treatment:   1x week for 6 weeks during radiation  Then follow up check 6 weeks post radiation.        SUBJECTIVE:  Patient???s communication preference: verbal, written, visual, prn      Pt reports fatigue with chemo. Reports feeling sig numbness in bilat arms, legs and axilla.  Reports has 1 more chemo left to go.     0/10 Pain reported today.     Onset of swelling:  Since drains removed.      History of Present Illness/ Pt reports:   Pt reports recurrent Seroma since drains removed . Has been drained multiple times 1s time 360 ml, second time 310,  35 mil,  3rd time 55 ml, 4th time 48 ml.  Pt reports has not noticed swelling in extremity.  Some ROM  Deficits/ tightness noted.     Home environment:       Lives with family. Has two kids in college   Sleeping position: bed     Pt has caregiver available, capable and willing to help:yes     Occupation/ Activities/Recreation:        Occupational History   ??? Not on file       Prior Functional Status:   Independent    Current Functional Limitations: The patient reports the following limitations based on verbal description and a Quick DASH score of  which indicates 6% impairment of the upper extremity:  arm, shoulder, and hand pain    Sexual Dysfunction or Bowel or Bladder changes : none    History of infections: no    Previous Lymphedema Treatment Type: none    Current management: n/a       Medical hx/conditions/surgical procedures, medications, allergies:    Reviewed      Recent Procedures/Tests/Findings:  Oncology History    2018: Left breast cT2 N1a, IDC, G1, +/+/-.        Malignant neoplasm of upper-outer quadrant of left breast in female, estrogen receptor positive (CMS-HCC)    06/07/2017 -  Presenting Symptoms     Left breast palpable UOQ mass         06/07/2017 Interval Scan(s)     Diagnostic [facility] at [facility]: left breast UOQ mass difficult to measure, but appears to measure at least 5.6 cm.    [place]: left breast ill defined shadowing lesion at 2:00, 6 CFN measuring 3.7 x 2.6 x 2.5 cm.     Single mildly abnormal node in left axilla with constriction of fatty hilum.         06/13/2017 Biopsy     [facility] core biopsy of left breast 2:00, 6 CFN: IDC, G1, ER+(95%), PR+(30%), HER2 negative by FISH.  [facility] core biopsy left axillary LN: +LN metastasis.         06/13/2017 -  Other     Seen in [facility] ED following core biopsy in [facility] for axillary pain with non-expanding 4 cm hematoma noted in left axilla. No intervention required other than symptomatic measures (compression, pain management).         06/20/2017 Tumor Board     [facility] recs: left breast cT2 N1a IDC, G1, receptors pending. Per outside imaging there was a single abnormal LN found by [place] only. Awaiting receptors. Need SAVI into that clipped node. Discussed that she otherwise fits ACOSOG Z0011 criteria with negative clinical exam prior to biopsy and single abnormal LN on axillary [place]. Reasonable to do SLNB and excise biopsied node as well. If receptors favorable, consider surgery first approach, good BCT candidate. If high risk (TN, HER+) may need NACT approach. Briefly discussed ALTERNATE, but consensus was that she will likely be recommended for chemo given age and nodal positivity. Meeting surgery, rad/onc today. Will circle back with med/onc as needed.         07/03/2017 Surgery     Left [facility] localized/US guided partial mastectomy and left SLNbx with ALNDx: Invasive mammary carcinoma with mixed ductal and lobular features present in association with in situ carcinoma with mixed ductal and lobular features measuring 8.3 cm with positive inferior and lateral margin. Sentinel lymph node biopsy: 1/1 node positive for metastatic disease measuring 7 mm. No ECE. Palpable axillary  lymph nodes: 4/4 lymph nodes with metastatic disease measuring 10 mm.  No ECE.  Additional palpable axillary lymph node 0/1 with isolated tumor cells. SAVI scout in the axilla was defective and there was no localization signal from the SAVI probe.         08/14/2017 Surgery     Left re-excision, ALND w ARM: Lateral margin: Invasive carcinoma with mixed ductal and lobular features measuring 7mm w LVI, margin negative.   Inferior margin: Multiple foci of lymphovasular invasion present.  No evidence of stromal invasion (final margin negative)  Left ALND: ??Two of twenty-three lymph nodes positive for carcinoma (2/23).  Size of larger tumor deposit: 6 mm.  Extracapsular extension: Absent         09/21/2017 -  Chemotherapy     Started ddTaxol--->ddAC         09/21/2017 Adverse Reaction     First Taxol infused for ~20 minutes resulting in flushing with chest tightness and nausea.  Infusion was stopped and administered.  Symptoms subsided within 20 minutes but she continued to feel some chest tightness with deep inspiration.  Albuterol nebulizer was given.  She then returned to baseline and infusion was restarted at 1/2 rate and titrated up slowly.  She was able to complete infusion without any further events.          10/05/2017 -  Chemotherapy     Switching to Abraxane due to infusion reaction         10/11/2017 Adverse Reaction     Acute DVT Rt interval jugular, brachiocephalic, subclavian vein-now on Eliquis 5 mg qd.          12/05/2017 -  Chemotherapy Chemotherapy Treatment    Treatment Goal Curative   Line of Treatment [No plan line of treatment]   Plan Name OP BREAST AC (DOSE DENSE)   Start Date 12/07/2017   End Date 01/26/2018   Provider Willeen Niece, MD   Chemotherapy dexamethasone (DECADRON) tablet 12 mg, 12 mg, Oral, Once, 4 of 4 cycles  Administration: 12 mg (12/07/2017), 12 mg (12/21/2017), 12 mg (01/11/2018), 12 mg (01/25/2018)  cyclophosphamide (CYTOXAN) 1,254 mg in sodium chloride (NS) 0.9 % 250 mL IVPB, 600 mg/m2 = 1,254 mg, Intravenous, Once, 4 of 4 cycles  Administration: 1,254 mg (12/07/2017), 1,254 mg (12/21/2017), 1,254 mg (01/11/2018), 1,254 mg (01/25/2018)  DOXOrubicin (ADRIAMYCIN) syringe 125.4 mg, 60 mg/m2 = 125.4 mg, Intravenous, Once, 4 of 4 cycles  Administration: 125.4 mg (12/07/2017), 125.4 mg (12/21/2017), 125.4 mg (01/11/2018), 125.4 mg (01/25/2018)                 OBJECTIVE:        Photos 12/24/17     Girth Measurements:      DYNAMIC GAIT INDEX    Grading: record the lowest category that applies.   1.Gait level surface: 3  Instructions: Walk at your normal speed from here to the next mark (20???).     (3) Normal: walks 20???, no assistive devices, good speed, no evidence for imbalance, normal gait pattern.   (2) Mild impairment: walks 20???, uses assistive devices, slower speed, mild gait deviations.   (1) Moderate impairment: walks 20???, slow speed, abnormal gait patters, evidence for imbalance.   (0) Severe impairment: cannot walk 20??? without assistance, severe gait deviations or imbalance.     2.Change in gait speed: 3  Instructions: Begin walking at your normal pace (for 5???), when I tell you ???go???, walk as fast as you can (for 5???).  When I tell you ???slow???, walk as slowly as you can (for 5???).     (3) Normal: Able to smoothly change walking speed without loss of balance or gait deviation. Shows significant difference in walking speeds between normal, fast and slow paces.   (2) Mild impairment: Is able to change speed but demonstrates mild gait deviations, or no gait deviations but unable to achieve a significant change in velocity, or uses as assistive device.   (1) Moderate impairment: Makes only minor adjustments to walking speed, or accomplishes a change in speed with significant gait deviations, or changes speed but loses balance but is able to recover and continue walking.   (0) Severe impairment: Cannot change speeds, or loss balance and has to reach for a wall or be caught.     3.Gait with horizontal head turns: 1  Instructions: Begin walking at your normal pace. When I tell you to ???look right???, keep walking straight, but turn your head to the right. Keep looking to the right until I tell you ???look left???, then keep walking straight and turn your head to the left. Keep your head to the left until I tell you, ???look straight???, then keep walking straight, but return your head to the centre.     (3) Normal: Performs head turns smoothly with no change in gait.   (2) Mild impairment: Performs head turns smoothly with slight change in gait velocity, i.e. minor disruption to smooth gait path or uses walking aid.   (1) Moderate impairment: Performs head turns with moderate change in gait velocity, slows down, staggers, but recovers, can continue to walk.   (0) Severe impairment: Performs task with severe disruption of gait, i.e. staggers outside 15??? path, loses balance, stops, reaches for wall.     4.Gait with vertical head turns:3  Instructions: Begin walking at your normal pace. When I tell you to ???look up???, keep walking straight, but tip your head and look up. Keep looking up until I tell you, ???look down???. Then keep walking straight and turn your head down. Keep looking down until I tell you, ??? look straight???, then keep walking straight, but return your head to the centre.     (3) Normal: Performs head turns smoothly with no change in gait.   (2) Mild impairment: Performs head turns smoothly with slight change in gait velocity, i.e. minor disruption to smooth gait path or uses walking aid.   (1) Moderate impairment: Performs head turns with moderate change in gait velocity, slows down, staggers, but recovers, can continue to walk.   (0) Severe impairment: Performs task with severe disruption of gait, i.e. staggers outside 15??? path, loses balance, stops, reaches for wall.     5.Gait and pivot turn: 1  Instructions: Begin walking at your normal pace. When I tell you, ???turn and stop???, turn as quickly as you can to face the opposite direction and stop.     (3) Normal: Pivot turns safely within 3 seconds and stops quickly with no loss of balance.   (2) Mild impairment: pivot turns safely in >3 seconds and stops with no loss of balance.   (1) Moderate impairment: Turns slowly, requires verbal cueing, requires several small steps to catch balance following turn and stop.   (0) Severe impairment: Cannot turn safely, requires assistance to turn and stop.     6.Step over obstacle: 2  Instructions: Begin walking at your normal speed. When you come to the shoebox, step over it, not around it, and  keep walking.     (3) Normal: Is able to step over box without changing gait speed; no evidence for imbalance.   (2) Mild impairment: Is able to step over shoe box, but must slow down and adjust steps to clear box safely.   (1) Moderate impairment: Is able to step over box but must stop, then step over. May require verbal cueing.   (0) Severe impairment: Cannot perform without assistance.     7.Step around obstacles: 3  Instructions: Begin walking at normal speed. When you come to the first cone (about 6??? away), walk around the right side of it. When you some to the second cone (6??? past first cone), walk around it to the left.     (3) Normal: Is able to walk safely around cones safely without changing gait speed; no evidence of imbalance.   (2) Mild impairment: Is able to step around both cones, but must slow down and adjust steps to clear cones.   (1) Moderate impairment: Is able to clear cones but must significantly slow speed to accomplish task, or requires verbal cueing.   (0) Severe impairment: Unable to clear cones, walks into one or both cones, or requires physical assistance.     8.Steps: 2  Instructions: Walk up these stairs as you would at home.(i.e. using a rail if necessary. At the top, turn around and walk down.     (3) Normal: Alternating feet, no rail.   (2) Mild impairment: Alternating feet, must use rail.   (1) Moderate impairment: Two feet to a stair, must use rail.   (0) Severe impairment: Cannot do safely.     TOTAL SCORE: 18/24  F:\Intran[employer]\[facility] website\physiotherapy section\Dynamic Gait Index v.doc       Location of swelling:  L chest and axilla.     Skin: WFL except for the following:  increased skin thickness    Stemmer???s sign:no.      Sensation Intact to light touch:   Decreased sensation under axilla, around scar, and posterior lateral upper arm     Incision: healing appropriately for post op status, adhered to underlying tissue    Axillary Web Syndrome::yes     Posture/Observations:  Forward head, rounded shoulders        Range of Motion/Flexibilty:   UE WFL except  End range overhead activity.     Strength/MMT:   UE WFL     Total Treatment Time: 55 min        Treatment Rendered:      Manual therapy  X 55 min    - LUQ MLD.   - cording stretch x 10 min             Today's Charges (noted here with $$):                          Equipment provided/recommended:   written HEP    Communication/consultation with other professionals:  Email to DME supplier [employer] re: garment needs        I attest that I have reviewed the above information.  Signed: Kirke Corin, PT   01/17/2018 4:40 PM

## 2018-01-22 MED ORDER — LIDOCAINE-DIPHENHYD-AL-MAG-SIM 200 MG-25 MG-400 MG-40MG/30ML MOUTHWASH
Freq: Four times a day (QID) | OROMUCOSAL | 1 refills | 0.00000 days | Status: CP | PRN
Start: 2018-01-22 — End: 2018-02-27

## 2018-01-25 ENCOUNTER — Encounter: Admit: 2018-01-25 | Discharge: 2018-01-25 | Payer: PRIVATE HEALTH INSURANCE

## 2018-01-25 ENCOUNTER — Encounter: Admit: 2018-01-25 | Discharge: 2018-01-25 | Payer: PRIVATE HEALTH INSURANCE | Attending: Family | Primary: Family

## 2018-01-25 DIAGNOSIS — Z170 Estrogen receptor positive status [ER+]: Secondary | ICD-10-CM

## 2018-01-25 DIAGNOSIS — C50412 Malignant neoplasm of upper-outer quadrant of left female breast: Principal | ICD-10-CM

## 2018-01-25 DIAGNOSIS — M5126 Other intervertebral disc displacement, lumbar region: Secondary | ICD-10-CM

## 2018-01-25 LAB — CBC W/ AUTO DIFF
BASOPHILS ABSOLUTE COUNT: 0 10*9/L (ref 0.0–0.1)
BASOPHILS RELATIVE PERCENT: 0.7 %
EOSINOPHILS ABSOLUTE COUNT: 0 10*9/L (ref 0.0–0.4)
EOSINOPHILS RELATIVE PERCENT: 0.1 %
HEMATOCRIT: 34 % — ABNORMAL LOW (ref 36.0–46.0)
LARGE UNSTAINED CELLS: 2 % (ref 0–4)
LYMPHOCYTES ABSOLUTE COUNT: 0.9 10*9/L — ABNORMAL LOW (ref 1.5–5.0)
LYMPHOCYTES RELATIVE PERCENT: 14.2 %
MEAN CORPUSCULAR HEMOGLOBIN CONC: 33.5 g/dL (ref 31.0–37.0)
MEAN CORPUSCULAR HEMOGLOBIN: 29.2 pg (ref 26.0–34.0)
MEAN CORPUSCULAR VOLUME: 87 fL (ref 80.0–100.0)
MEAN PLATELET VOLUME: 8.2 fL (ref 7.0–10.0)
MONOCYTES ABSOLUTE COUNT: 0.3 10*9/L (ref 0.2–0.8)
MONOCYTES RELATIVE PERCENT: 5.6 %
NEUTROPHILS RELATIVE PERCENT: 77.6 %
RED BLOOD CELL COUNT: 3.9 10*12/L — ABNORMAL LOW (ref 4.00–5.20)
RED CELL DISTRIBUTION WIDTH: 18.8 % — ABNORMAL HIGH (ref 12.0–15.0)
WBC ADJUSTED: 6.1 10*9/L (ref 4.5–11.0)

## 2018-01-25 LAB — SLIDE REVIEW

## 2018-01-25 LAB — HEPATIC FUNCTION PANEL
ALKALINE PHOSPHATASE: 55 U/L (ref 38–126)
AST (SGOT): 22 U/L (ref 14–38)
BILIRUBIN DIRECT: <0.1 mg/dL (ref 0.00–0.40)
PROTEIN TOTAL: 5.8 g/dL — ABNORMAL LOW (ref 6.5–8.3)

## 2018-01-25 LAB — TOXIC GRANULATION

## 2018-01-25 LAB — ALBUMIN: Albumin:MCnc:Pt:Ser/Plas:Qn:: 3.6

## 2018-01-25 LAB — EOSINOPHILS ABSOLUTE COUNT: Lab: 0

## 2018-01-25 NOTE — Unmapped (Signed)
Lab on 01/25/2018   Component Date Value Ref Range Status   ??? Albumin 01/25/2018 3.6  3.5 - 5.0 g/dL Final   ??? Total Protein 01/25/2018 5.8* 6.5 - 8.3 g/dL Final   ??? Total Bilirubin 01/25/2018 0.2  0.0 - 1.2 mg/dL Final   ??? Bilirubin, Direct 01/25/2018 <0.10  0.00 - 0.40 mg/dL Final   ??? AST 16/05/9603 22  14 - 38 U/L Final   ??? ALT 01/25/2018 33  15 - 48 U/L Final   ??? Alkaline Phosphatase 01/25/2018 55  38 - 126 U/L Final   ??? WBC 01/25/2018 6.1  4.5 - 11.0 10*9/L Final   ??? RBC 01/25/2018 3.90* 4.00 - 5.20 10*12/L Final   ??? HGB 01/25/2018 11.4* 12.0 - 16.0 g/dL Final   ??? HCT 54/04/8118 34.0* 36.0 - 46.0 % Final   ??? MCV 01/25/2018 87.0  80.0 - 100.0 fL Final   ??? MCH 01/25/2018 29.2  26.0 - 34.0 pg Final   ??? MCHC 01/25/2018 33.5  31.0 - 37.0 g/dL Final   ??? RDW 14/78/2956 18.8* 12.0 - 15.0 % Final   ??? MPV 01/25/2018 8.2  7.0 - 10.0 fL Final   ??? Platelet 01/25/2018 113* 150 - 440 10*9/L Final   ??? Neutrophils % 01/25/2018 77.6  % Final   ??? Lymphocytes % 01/25/2018 14.2  % Final   ??? Monocytes % 01/25/2018 5.6  % Final   ??? Eosinophils % 01/25/2018 0.1  % Final   ??? Basophils % 01/25/2018 0.7  % Final   ??? Absolute Neutrophils 01/25/2018 4.8  2.0 - 7.5 10*9/L Final   ??? Absolute Lymphocytes 01/25/2018 0.9* 1.5 - 5.0 10*9/L Final   ??? Absolute Monocytes 01/25/2018 0.3  0.2 - 0.8 10*9/L Final   ??? Absolute Eosinophils 01/25/2018 0.0  0.0 - 0.4 10*9/L Final   ??? Absolute Basophils 01/25/2018 0.0  0.0 - 0.1 10*9/L Final   ??? Large Unstained Cells 01/25/2018 2  0 - 4 % Final   ??? Microcytosis 01/25/2018 Slight* Not Present Final   ??? Macrocytosis 01/25/2018 Slight* Not Present Final   ??? Anisocytosis 01/25/2018 Moderate* Not Present Final   ??? Smear Review Comments 01/25/2018 See Comment* Undefined Final    Smear Reviewed     ??? Toxic Granulation 01/25/2018 Present* Not Present Final     If you feel like this is an emergency please call 911.  For appointments or questions Monday through Friday 8AM-5PM please call [phone] or Toll Free [phone]. For Medical questions or concerns ask for the Nurse Triage Line.  On Nights, Weekends, and Holidays call [phone] and ask for the Oncologist on Call.  Reasons to call the Nurse Triage Line:  Fever of 100.5 or greater  Nausea and/or vomiting not relived with nausea medicine  Diarrhea or constipation  Severe pain not relieved with usual pain regimen  Shortness of breath  Uncontrolled bleeding  Mental status changes

## 2018-01-25 NOTE — Unmapped (Signed)
Encounter addended by: Fredia Beets, RN on: 01/25/2018 12:38 PM   Actions taken: Flowsheet accepted

## 2018-01-25 NOTE — Unmapped (Signed)
It was a pleasure seeing you today.   Continue with the current plan of care.  Follow-up in 6 weeks.    Orders Placed This Encounter   Procedures   ??? Ambulatory referral to Neurosurgery   ??? Ambulatory referral to Internal Medicine       Labs from today if done:  Lab on 01/25/2018   Component Date Value   ??? WBC 01/25/2018 6.1    ??? RBC 01/25/2018 3.90*   ??? HGB 01/25/2018 11.4*   ??? HCT 01/25/2018 34.0*   ??? MCV 01/25/2018 87.0    ??? [facility] 01/25/2018 29.2    ??? MCHC 01/25/2018 33.5    ??? RDW 01/25/2018 18.8*   ??? MPV 01/25/2018 8.2    ??? Platelet 01/25/2018 113*   ??? Neutrophils % 01/25/2018 77.6    ??? Lymphocytes % 01/25/2018 14.2    ??? Monocytes % 01/25/2018 5.6    ??? Eosinophils % 01/25/2018 0.1    ??? Basophils % 01/25/2018 0.7    ??? Absolute Neutrophils 01/25/2018 4.8    ??? Absolute Lymphocytes 01/25/2018 0.9*   ??? Absolute Monocytes 01/25/2018 0.3    ??? Absolute Eosinophils 01/25/2018 0.0    ??? Absolute Basophils 01/25/2018 0.0    ??? Large Unstained Cells 01/25/2018 2    ??? Microcytosis 01/25/2018 Slight*   ??? Macrocytosis 01/25/2018 Slight*   ??? Anisocytosis 01/25/2018 Moderate*        Future Appointments:  Future Appointments   Date Time Provider Department Center   01/25/2018  9:30 AM ONCDEV CHAIR 52 [id number]   01/26/2018  1:00 PM ONCINF CHAIR 10 [id number]   01/31/2018 10:30 AM Sherin Shelah Lewandowsky, PT [facility]   02/04/2018 12:00 AM Radonc Recurring Account [facility]   02/08/2018 12:30 PM Tressie Ellis, MD [facility]   02/08/2018  2:00 PM Tressie Ellis, MD [facility]   03/07/2018 12:10 AM Radonc Recurring Account [facility]   03/07/2018 12:15 AM Radonc Recurring Account [facility]   03/07/2018  9:30 AM Sherin Shelah Lewandowsky, PT [facility]   03/14/2018  9:30 AM Sherin Shelah Lewandowsky, PT [facility]   03/21/2018 10:30 AM Sherin Shelah Lewandowsky, PT [facility]   03/28/2018  9:30 AM Sherin Shelah Lewandowsky, PT [facility]   04/04/2018 10:30 AM Sherin Shelah Lewandowsky, PT [facility]       For appointments call [phone]  For new symptoms or health related issues, please call   Nurse Navigator: [name], RN [phone]  On weekends and after hours on weekdays, please call [phone] for the oncology fellow on call.      If you use [facility], please know that I check messages only twice a week. If your message is urgent, please call or page your nurse navigator or the on-call fellow.

## 2018-01-25 NOTE — Unmapped (Signed)
Pt here for last treatment of A/C. Tolerated well. Port with good blood return throughout.   Pt completed treatment without complications. Port de-accessed per protocol. Pt discharged from clinic stable and ambulatory; accompanied by her husband.

## 2018-01-25 NOTE — Unmapped (Signed)
0745:  Labs drawn and sent for analysis.  Care provided by  Weyman Croon, RN

## 2018-01-26 ENCOUNTER — Ambulatory Visit: Admit: 2018-01-26 | Discharge: 2018-01-27 | Payer: PRIVATE HEALTH INSURANCE

## 2018-01-26 DIAGNOSIS — C50412 Malignant neoplasm of upper-outer quadrant of left female breast: Principal | ICD-10-CM

## 2018-01-26 DIAGNOSIS — Z170 Estrogen receptor positive status [ER+]: Secondary | ICD-10-CM

## 2018-01-26 NOTE — Unmapped (Signed)
If you feel like this is an emergency please call 911.  For appointments or questions Monday through Friday 8AM-5PM please call [phone] or Toll Free [phone]. For Medical questions or concerns ask for the Nurse Triage Line.  On Nights, Weekends, and Holidays call [phone] and ask for the Oncologist on Call.  Reasons to call the Nurse Triage Line:  Fever of 100.5 or greater  Nausea and/or vomiting not relived with nausea medicine  Diarrhea or constipation  Severe pain not relieved with usual pain regimen  Shortness of breath  Uncontrolled bleeding  Mental status changes

## 2018-01-26 NOTE — Unmapped (Signed)
Pt arrived to chair at 1240 for subcutaneous udenyca. Pt denies any issues today, awake and alert, NAD, VSS. Udenyca injection completed at 1253 without complication. AVS given and reviewed. Pt denies any questions or concerns; discharged home ambulatory at 1255.

## 2018-02-03 NOTE — Unmapped (Signed)
Breast Cancer Return Patient Evaluation:     Referring Physician: Cala Bradford, Md  [address]  [facility] [place], [place] [address].     PCP: Stacie Acres WHITE, MD    Assessment/Plan:  I spent at least 40 minutes with this patient more than 50% in counseling. The following issues were discussed:    A 62yo woman with a Left breast pT3pN2a, IDC, G2, ER+(95%)/PR+(30%)/HER2-neg, 5/6 LN positive, +LVI and positive surgical margins s/p BCT with targeted axillary LN bx on 07/03/17. Unfortunately her pathology revealed residual disease and she requires re-excision with ALND. Completed this on 08/14/17 showing 7 mm of residual tumor, negative margins, 2/23 +LN with larger tumor deposit of 6 mm.    Planned for ddAC-T but H/O neuropathy; making weekly paclitaxel challenging so tried q 2 week taxol. In her, we started with her taxane because of wound healing and inability to get a port. However, she had a hypesensitivity reaction  into Taxol infusion and was able to complete at a slower rate.  Accordingly, switched to q3w Abraxane. Now receiving ddAC.    Today, her symptoms are slightly improved from last visit.  Her urinary incontinence has improved with the regular toilet training and fluid restriction at bedtime.  She is continent of her bowels, yet she still reports some numbness in her saddle area.  I do believe the symptoms are more related to degenerative disc and bulging in the lumbar spine as evidenced by her abnormal MRI on 01/17/2018.  I think that some physical therapy and an evaluation with a uro-gynecologist would be helpful.  I have also submitted a referral to a spine specialist to further evaluate her symptoms.  She has continued to ambulate with little assistance, but feels a little bit more so unsteady on her feet.  She reports no recent falls and is able to get onto the exam table without difficulty.  It is possible that some of her recent taxane treatment has impacted her mobility, but having already had a history of spinal disc disease it is unclear as to how much of an impact there has been from her treatments. Adriamycin and Cytoxan are not known to impact neuropathy dramatically, therefore we will complete her fourth cycle today.  She already has a radiation oncology appointment with Dr. Lucrezia Europe to begin the planning phase for her treatments.    I spent at least 40 minutes with this patient more than 50% in counseling. The following issues were discussed:     - Plan: Commence to C4 ddAC, labs adequate  - Cardiac monitoring: Echo 07/27/17 EF >55%  - Antiestrogen therapy (AI) for 5-16yrs  - CT-CAP 07/25/17 NED  - Neuropathy: persistent but improved with gabapentin-doesn't want anymore medicine changes    Declined Cymbalta/Lyrica  - Bone pain with Neulasta with 1st Cycle of ddTaxol    Udenyca with Claritin 3 days     Can also use otc Tylenol/Oxycodone prn for Neulasta bone pain  - Nausea with every cycle of ddAC, relieved with Zofran  - Urinary incontinence: Improved with fluid restriction and regular timed voiding.  - Saddle numbness: No bowel dysfunction and likely due to bulging disc/spinal nerve impingement.   Will send for PT and URO-GYN and Spinal surgery evaluation.   -Breast imaging: Due for left sided MMG    RTC in 6 weeks Provider/MMG    Reason for Visit: A 62 y.o. female with breast cancer   Receiving adjuvant chemotherapy  for her pT3pN2a HR+ Her2 negative left sided breast cancer.    10/11/17 She initially presented with self-palpated left breast mass and workup/surgery revealed an 8.3cm tumor HR+/HER2-neg with positive LNs. She reported baseline neuropathy in her feet and is followed by a Neuroloogist in [place].  She does not have diabetes and states that the neuropathy is due to structural issues with her feet and high arches and runs in her family. She has no history of heart issues, never had an MI or heart failure. She recently moved to [facility] from [place].  She was laid off from her job and was working part-time but could not qualify for [employer] on the open market so she had to quit her part-time job.      Interval History:  Here with sister.  Reporting mild to moderate fatigue, hair loss, nausea, hot flashes and mild intermittent headaches without vision changes.  Continues to struggle with neuropathy, chronic which predates her breast cancer treatment.  She is on Neurontin 300 mg 3 times daily, does not want to try Cymbalta/Lyrica.  Recent MRI of the lumbar spine shows significant disc bulging, which is likely the cause of her significant peripheral neuropathy.  She tells me that this area has never been x-rayed or evaluated, and she always thought her symptoms are related to high arches in the anatomy of her foot bones.  She has noticed a slight worsening of her neuropathy since starting chemotherapy but she is still able to ambulate.  Last visit she reported some urinary incontinence which seems to be better.  I recommended that she cut back on her fluids before bedtime and that she void at regular intervals.  She reports some mild stress urinary incontinence and a uro-gynecologist may be a good choice to further evaluate her symptoms.  She still feels some numbness in her saddle area, but she is continent of her bowels.  Performance status=1    Breast Cancer History:   Oncology History    2018: Left breast cT2 N1a, IDC, G1, +/+/-.        Malignant neoplasm of upper-outer quadrant of left breast in female, estrogen receptor positive (CMS-HCC)    06/07/2017 -  Presenting Symptoms     Left breast palpable UOQ mass         06/07/2017 Interval Scan(s)     Diagnostic MMG at [facility]: left breast UOQ mass difficult to measure, but appears to measure at least 5.6 cm.    [place]: left breast ill defined shadowing lesion at 2:00, 6 CFN measuring 3.7 x 2.6 x 2.5 cm.     Single mildly abnormal node in left axilla with constriction of fatty hilum. 06/13/2017 Biopsy     [facility] core biopsy of left breast 2:00, 6 CFN: IDC, G1, ER+(95%), PR+(30%), HER2 negative by FISH.  [facility] core biopsy left axillary LN: +LN metastasis.         06/13/2017 -  Other     Seen in [facility] ED following core biopsy in [facility] for axillary pain with non-expanding 4 cm hematoma noted in left axilla. No intervention required other than symptomatic measures (compression, pain management).         06/20/2017 Tumor Board     [facility] recs: left breast cT2 N1a IDC, G1, receptors pending. Per outside imaging there was a single abnormal LN found by [place] only. Awaiting receptors. Need SAVI into that clipped node. Discussed that she otherwise fits ACOSOG Z0011 criteria with negative clinical exam prior to  biopsy and single abnormal LN on axillary [place]. Reasonable to do SLNB and excise biopsied node as well. If receptors favorable, consider surgery first approach, good BCT candidate. If high risk (TN, HER+) may need NACT approach. Briefly discussed ALTERNATE, but consensus was that she will likely be recommended for chemo given age and nodal positivity. Meeting surgery, rad/onc today. Will circle back with med/onc as needed.         07/03/2017 Surgery     Left [facility] localized/US guided partial mastectomy and left SLNbx with ALNDx: Invasive mammary carcinoma with mixed ductal and lobular features present in association with in situ carcinoma with mixed ductal and lobular features measuring 8.3 cm with positive inferior and lateral margin. Sentinel lymph node biopsy: 1/1 node positive for metastatic disease measuring 7 mm. No ECE. Palpable axillary lymph nodes: 4/4 lymph nodes with metastatic disease measuring 10 mm.  No ECE.  Additional palpable axillary lymph node 0/1 with isolated tumor cells. SAVI scout in the axilla was defective and there was no localization signal from the SAVI probe.         08/14/2017 Surgery     Left re-excision, ALND w ARM: Lateral margin: Invasive carcinoma with mixed ductal and lobular features measuring 7mm w LVI, margin negative.   Inferior margin: Multiple foci of lymphovasular invasion present.  No evidence of stromal invasion (final margin negative)  Left ALND: ??Two of twenty-three lymph nodes positive for carcinoma (2/23).  Size of larger tumor deposit: 6 mm.  Extracapsular extension: Absent         09/21/2017 -  Chemotherapy     Started ddTaxol--->ddAC         09/21/2017 Adverse Reaction     First Taxol infused for ~20 minutes resulting in flushing with chest tightness and nausea.  Infusion was stopped and administered.  Symptoms subsided within 20 minutes but she continued to feel some chest tightness with deep inspiration.  Albuterol nebulizer was given.  She then returned to baseline and infusion was restarted at 1/2 rate and titrated up slowly.  She was able to complete infusion without any further events.          10/05/2017 -  Chemotherapy     Switching to Abraxane due to infusion reaction         10/11/2017 Adverse Reaction     Acute DVT Rt interval jugular, brachiocephalic, subclavian vein-now on Eliquis 5 mg qd.          12/05/2017 -  Chemotherapy     Chemotherapy Treatment    Treatment Goal Curative   Line of Treatment [No plan line of treatment]   Plan Name OP BREAST AC (DOSE DENSE)   Start Date 12/07/2017   End Date 01/26/2018   Provider Willeen Niece, MD   Chemotherapy dexamethasone (DECADRON) tablet 12 mg, 12 mg, Oral, Once, 4 of 4 cycles  Administration: 12 mg (12/07/2017), 12 mg (12/21/2017), 12 mg (01/11/2018), 12 mg (01/25/2018)  cyclophosphamide (CYTOXAN) 1,254 mg in sodium chloride (NS) 0.9 % 250 mL IVPB, 600 mg/m2 = 1,254 mg, Intravenous, Once, 4 of 4 cycles  Administration: 1,254 mg (12/07/2017), 1,254 mg (12/21/2017), 1,254 mg (01/11/2018), 1,254 mg (01/25/2018)  DOXOrubicin (ADRIAMYCIN) syringe 125.4 mg, 60 mg/m2 = 125.4 mg, Intravenous, Once, 4 of 4 cycles  Administration: 125.4 mg (12/07/2017), 125.4 mg (12/21/2017), 125.4 mg (01/11/2018), 125.4 mg (01/25/2018) Past Medical History:  Patient Active Problem List   Diagnosis   ??? Malignant neoplasm of upper-outer quadrant of left breast  in female, estrogen receptor positive (CMS-HCC)       Gyn History:   LMP 2012    Surgical History:   Past Surgical History:   Procedure Laterality Date   ??? ANAL FISSURECTOMY     ??? BREAST BIOPSY Left 06/2017    malignant   ??? BREAST BIOPSY Left 06/2017    Lympnode bx-positive   ??? IR INSERT PORT AGE GREATER THAN 5 YRS  09/28/2017    IR INSERT PORT AGE GREATER THAN 5 YRS 09/28/2017 Rush Barer, MD IMG VIR [facility]   ??? PR BX/REMV,LYMPH NODE,DEEP AXILL Left 07/03/2017    Procedure: BX/EXC LYMPH NODE; OPEN, DEEP AXILRY NODE;  Surgeon: Talbert Cage, DO;  Location: ASC OR [facility];  Service: Surgical Oncology   ??? PR INTRAOPERATIVE SENTINEL LYMPH NODE ID W DYE INJECTION Left 07/03/2017    Procedure: INTRAOPERATIVE IDENTIFICATION SENTINEL LYMPH NODE(S) INCLUDE INJECTION NON-RADIOACTIVE DYE, WHEN PERFORMED;  Surgeon: Talbert Cage, DO;  Location: ASC OR [facility];  Service: Surgical Oncology   ??? PR INTRAOPERATIVE SENTINEL LYMPH NODE ID W DYE INJECTION Left 08/14/2017    Procedure: INTRAOPERATIVE IDENTIFICATION SENTINEL LYMPH NODE(S) INCLUDE INJECTION NON-RADIOACTIVE DYE, WHEN PERFORMED;  Surgeon: Talbert Cage, DO;  Location: ASC OR [facility];  Service: Surgical Oncology Breast   ??? PR MASTECTOMY, PARTIAL Left 07/03/2017    Procedure: MASTECTOMY, savi scout PARTIAL (EG, LUMPECTOMY, TYLECTOMY, QUADRANTECTOMY, SEGMENTECTOMY);  Surgeon: Talbert Cage, DO;  Location: ASC OR [facility];  Service: Surgical Oncology   ??? PR MASTECTOMY, PARTIAL Left 08/14/2017    Procedure: MASTECTOMY, PARTIAL (EG, LUMPECTOMY, TYLECTOMY, QUADRANTECTOMY, SEGMENTECTOMY) Re-excision;  Surgeon: Talbert Cage, DO;  Location: ASC OR [facility];  Service: Surgical Oncology Breast   ??? PR REMOVE ARMPITS LYMPH NODES COMPLT Left 08/14/2017    Procedure: AXILLARY LYMPHADENECTOMY; COMPLETE;  Surgeon: Talbert Cage, DO;  Location: ASC OR [facility];  Service: Surgical Oncology Breast       Medications:  Current Outpatient Medications   Medication Sig Dispense Refill   ??? acetaminophen (TYLENOL) 500 MG tablet Take 1,000 mg by mouth every four (4) hours as needed for pain.     ??? apixaban (ELIQUIS) 5 mg Tab Take 1 tablet (5 mg total) by mouth Two (2) times a day. 60 tablet 5   ??? buPROPion (WELLBUTRIN XL) 300 MG 24 hr tablet Take 300 mg by mouth daily.     ??? dexamethasone (DECADRON) 4 MG tablet Take 2 tablets (8 mg total) by mouth daily. Take on Days 2, 3, and 4. 24 tablet 0   ??? docusate sodium (COLACE) 100 MG capsule Take 100 mg by mouth daily with breakfast.     ??? FLUoxetine (PROZAC) 10 MG tablet Take 10 mg by mouth daily.     ??? GABAPENTIN ORAL Take 900 mg by mouth nightly. Pt taking 1 capsule at noon and 3 capsules at 8PM     ??? lidocaine-diphenhydramine-aluminum-magnesium (FIRST-MOUTHWASH BLM) 200-25-400-40 mg/30 mL Mwsh 10 mL by Mouth route 4 (four) times a day as needed. 500 mL 1   ??? loratadine (CLARITIN ORAL) Take by mouth.     ??? LORazepam (ATIVAN) 0.5 MG tablet Take 1 tablet (0.5 mg total) by mouth nightly as needed (take for difficulty sleeping on nights taking dexamethasone). 30 tablet 0   ??? ondansetron (ZOFRAN) 4 MG tablet Take 2 tablets (8 mg total) by mouth every eight (8) hours as needed for nausea. 30 tablet 2   ??? ondansetron (ZOFRAN) 8 MG tablet Take 1 tablet (8 mg  total) by mouth every twelve (12) hours as needed for nausea. 30 tablet 2   ??? polyethylene glycol (MIRALAX) 17 gram packet Take 17 g by mouth daily.     ??? prochlorperazine (COMPAZINE) 10 MG tablet Take 1 tablet (10 mg total) by mouth every six (6) hours as needed (nausea). 30 tablet 2   ??? ranitidine (ZANTAC) 150 MG tablet Take 150 mg by mouth daily.      ??? LIDOCAINE 2 % solution   0     No current facility-administered medications for this visit.        Allergies:  Allergies   Allergen Reactions   ??? Erythromycin Diarrhea   ??? Penicillins Rash       Personal and Social History:  Social History     Socioeconomic History   ??? Marital status: Married     Spouse name: Not on file   ??? Number of children: Not on file   ??? Years of education: Not on file   ??? Highest education level: Not on file   Occupational History   ??? Not on file   Social Needs   ??? Financial resource strain: Not on file   ??? Food insecurity:     Worry: Not on file     Inability: Not on file   ??? Transportation needs:     Medical: Not on file     Non-medical: Not on file   Tobacco Use   ??? Smoking status: Never Smoker   ??? Smokeless tobacco: Never Used   Substance and Sexual Activity   ??? Alcohol use: No   ??? Drug use: No   ??? Sexual activity: Not on file   Lifestyle   ??? Physical activity:     Days per week: Not on file     Minutes per session: Not on file   ??? Stress: Not on file   Relationships   ??? Social connections:     Talks on phone: Not on file     Gets together: Not on file     Attends religious service: Not on file     Active member of club or organization: Not on file     Attends meetings of clubs or organizations: Not on file     Relationship status: Not on file   Other Topics Concern   ??? Not on file   Social History Narrative    The patient is married. She lives at home with her husband, Ed.  She has two children       Family History:  Cancer-related family history includes Breast cancer in her paternal aunt. There is no history of Cancer or Ovarian cancer.  indicated that the status of her mother is unknown. She indicated that the status of her father is unknown. She indicated that the status of her sister is unknown. She indicated that the status of her maternal grandmother is unknown. She indicated that the status of her maternal grandfather is unknown. She indicated that the status of her paternal grandmother is unknown. She indicated that the status of her paternal grandfather is unknown. She indicated that the status of her daughter is unknown. She indicated that the status of her neg hx is unknown.      Review of Systems: A complete review of systems was obtained including: Constitutional, Eyes, ENT, Cardiovascular, Respiratory, GI, GU, Musculoskeletal, Skin, Neurological, Psychiatric, Endocrine, Heme/Lymphatic, and Allergic/Immunologic systems. It is negative or non-contributory to the patient???s management except for the following:  None    Physical Examination:  Vital Signs: BP 110/57  - Pulse 81  - Temp 36.6 ??C (97.9 ??F) (Oral)  - Resp 18  - Ht 171 cm (5' 7.32)  - Wt 86.5 kg (190 lb 11.2 oz)  - SpO2 98%  - BMI 29.58 kg/m??   General:  Healthy-appearing female in no acute distress..  Cardiovascular:  Heart not clinically enlarged.  No significant murmurs or gallops.  No lower extremity edema.    Respiratory:  Chest clear to percussion and auscultation.   Gastrointestinal:  Abdomen soft without masses and tenderness, no hepatosplenomegaly or other masses.   Musculoskeletal:  No bony pain or tenderness.   Skin and Subcutaneous Tissues:  No rash, ecchymoses, purpuric lesions noted.   Breasts:  Left breast with well healing scar.  Mild swelling lateral left breast+resolving seroma. Resolving erythema, no fluctuance. Left axilla tender with well healed scar. R breast normal appearing.  Psychiatric:  Alert and oriented.  Mood is normal.  No other symptoms.   Heme/Lymphatic/Immunologic:  No cervical or axillary adenopathy.   Upper Extremity Lymphedema: None.   Neuro: A & O x3. ??  Hearing intact to finger rub bilaterally. ??  Peripheral fields intact to confrontation bilaterally. ??  EOMI, no nystagmus. PERRLA. ??  Facial sensation intact and equal bilaterally. Facial muscles intact and symmetric. Swallowing intact, uvula rises midline, tongue protrudes midline. ??  Shoulder shrug, head movement intact and equal bilaterally. ??  No tremors. Motor strength 5/5 upper and lower extremeties, bilaterally.   Gait smooth and coordinated. ??  RAM and finger-to-nose smoothly intact. ??  Sensation intact to light touch, equal and symmetric.

## 2018-02-04 ENCOUNTER — Encounter
Admit: 2018-02-04 | Discharge: 2018-03-06 | Payer: PRIVATE HEALTH INSURANCE | Attending: Internal Medicine | Primary: Internal Medicine

## 2018-02-04 ENCOUNTER — Ambulatory Visit
Admit: 2018-02-04 | Discharge: 2018-03-06 | Payer: PRIVATE HEALTH INSURANCE | Attending: Internal Medicine | Primary: Internal Medicine

## 2018-02-04 ENCOUNTER — Encounter: Admit: 2018-02-04 | Discharge: 2018-02-04 | Payer: PRIVATE HEALTH INSURANCE

## 2018-02-04 ENCOUNTER — Encounter
Admit: 2018-02-04 | Discharge: 2018-03-06 | Payer: PRIVATE HEALTH INSURANCE | Attending: Radiation Oncology | Primary: Radiation Oncology

## 2018-02-04 ENCOUNTER — Non-Acute Institutional Stay
Admit: 2018-02-04 | Discharge: 2018-03-06 | Payer: PRIVATE HEALTH INSURANCE | Attending: Internal Medicine | Primary: Internal Medicine

## 2018-02-04 ENCOUNTER — Encounter: Admit: 2018-02-04 | Discharge: 2018-03-06 | Payer: PRIVATE HEALTH INSURANCE

## 2018-02-04 DIAGNOSIS — Z170 Estrogen receptor positive status [ER+]: Secondary | ICD-10-CM

## 2018-02-04 DIAGNOSIS — Z510 Encounter for antineoplastic radiation therapy: Principal | ICD-10-CM

## 2018-02-04 DIAGNOSIS — C50412 Malignant neoplasm of upper-outer quadrant of left female breast: Principal | ICD-10-CM

## 2018-02-04 DIAGNOSIS — M5126 Other intervertebral disc displacement, lumbar region: Secondary | ICD-10-CM

## 2018-02-06 NOTE — Unmapped (Signed)
[facility] Triage Note     Patient: Erika Cabrera     Reason for call:  The patient contacted the Communication Center regarding the following:  ??  - Received notification to schedule a Mammo with Gaylyn Cheers in November but she just had a Mammogram last week.  Does she need one for that appointment?  ??  Please contact at [phone].    Time call returned: 1144     Phone Assessment: Spoke w/pt.     Triage Recommendations: Spoke w/pt:  1. Pt had mammogram on 02/04/2018.Recommendation is to have bil diagnostic mammogram in 6 months.  There fore, next Mammo due in January 2020.  2. Will contact Bing Ree for f/u appt w/her. Usually is the same day of Mammo.     Patient Response: thanks     Outstanding tasks: f/u appt     Patient Pharmacy has been verified and primary pharmacy has been marked as preferred

## 2018-02-06 NOTE — Unmapped (Signed)
Hi,     The patient contacted the Communication Center regarding the following:    - Received notification to schedule a Mammo with Gaylyn Cheers in November but she just had a Mammogram last week.  Does she need one for that appointment?    Please contact at [phone].    Thanks in advance,    Vernie Ammons  [facility] Communication Center   [phone]

## 2018-02-08 DIAGNOSIS — C50412 Malignant neoplasm of upper-outer quadrant of left female breast: Principal | ICD-10-CM

## 2018-02-08 DIAGNOSIS — Z170 Estrogen receptor positive status [ER+]: Secondary | ICD-10-CM

## 2018-02-08 NOTE — Unmapped (Signed)
Radiation Oncology Treatment Planning Note    Patient Name: Erika Cabrera  Patient Age: 62 y.o.  Date of Encounter: 02/08/2018    Diagnoses:   1. Malignant neoplasm of upper-outer quadrant of left breast in female, estrogen receptor positive (CMS-HCC)      Stage: Cancer Staging  Malignant neoplasm of upper-outer quadrant of left breast in female, estrogen receptor positive (CMS-HCC)  Staging form: Breast, AJCC 8th Edition  - Clinical stage from 06/20/2017: Stage IIA (cT2, cN1(f), cM0, G1, ER: Positive, PR: Positive, HER2: Negative) - Signed by Talbert Cage, DO on 06/26/2017  - Pathologic stage from 07/13/2017: Stage IB (pT3, pN2a(sn), cM0, G2, ER: Positive, PR: Positive, HER2: Negative) - Signed by Rudie Meyer, ANP on 07/19/2017       Treatment Intent: curative.    CLINICAL TREATMENT PLANNING:     Erika Cabrera is a 62 y.o. female left breast pT3pN2a, IDC, G2, ER+(95%)/PR+(30%)/HER2-neg, 5/6 LN positive, +LVI and positive surgical margins s/p BCT with targeted axillary LN bx on 07/03/17. Unfortunately her pathology revealed residual disease and she requires re-excision with ALND. Completed this on 08/14/17 showing 7 mm of residual tumor, negative margins, 2/23 +LN with larger tumor deposit of 6 mm. She is now s/p chemotherapy     Refer to the consult for full clinical details.    I plan to treat her with photons and electrons utilizing 3D CRT technique.     The radiation target area/treatment site will be the whole breast and lumpectomy cavity and regional nodes    I will attempt to minimize the dose to lungs.    The total radiation dose will be 5000cGy/fraction for a total of 25 fractions, treated once a day. This will be followed by a lumpectomy cavity boost of 1000 cGy at 200 cGy/fraction for a total of 5 fractions, treated once a day. Chemotherapy has been completed.    Technique Rationale:     3DCRT: 3D conformal therapy is necessary to increase dose conformity and to review DVH's and to review isodose distribution for lungs and the chest wall.       Simulation Order: I requested radiation treatment planning CT scan to acquire information regarding patient's anatomy of the treatment site, radiation treatment target volumes, and adjacent normal organs. This is required to be done in patient's treatment position for radiation treatment planning and for patient's subsequent radiation treatment positioning and treatment setups. .       Verification Simulation: I have ordered for the patient to come in for verification simulation on the treatment machine to ensure that information was transferred appropriately from the planning system to the treatment machine treatment and to verify patient setup, immobilization, and image guidance.    Image Guidance/Tracking: Weekly PORT films to verify agreement of treatment ports with original planned fields.     DIBH: Daily VisionRT monitoring in conjunction with DIBH will be used      SIMULATION:    Type:  Initial simulation of the whole breast    Contrast: None   customized immobilization and/or position devices including vacloc    The patient was taken to the CT simulation room and placed in a supine position with customized immobilization and/or position devices including the breast board and vacloc. Catheters/markers were placed on surface anatomy of interest including scars, mid axilla and midline chest.  A CT scan was obtained through the pertinent area including the entire breast. I approved the patient set up and reviewed the CT images;  both are adequate. I tentatively plan to utilize multiple fields and conformal blocking. The number and use of treatment devices will be determined at the time of computerized planning.    I have placed an isocenter/localization point in three dimensions on these images.  This was marked on the patient???s skin for subsequent radiation treatment set-up.  Additional details of the CT simulation are available in the departmental [facility] electronic medical record.  CT images were then transferred to the radiation treatment-planning computer for planning and dosimetry.    Lonzo Cloud, MD, MBA  Resident Physician PGY-4  Department of Radiation Oncology    I saw and evaluated and examined the patient, participating in the key portions of the service. I discussed the findings, assessment, and plan of care with the provider and patient. I agree with the findings and plan as documented in the note.     Trena Platt MD MS MPH  Assistant Professor  Department of Radiation Oncology  [place] [facility] [place] [facility]

## 2018-02-08 NOTE — Unmapped (Signed)
02/08/2018  3Ps covered. Gave pt information packet regarding radiation a    Blood pressure 104/64, pulse 86, temperature 35.6 ??C (96.1 ??F), temperature source Temporal, resp. rate 16, weight 82.9 kg (182 lb 11.2 oz), SpO2 99 %.    Wt Readings from Last 6 Encounters:   02/08/18 82.9 kg (182 lb 11.2 oz)   01/26/18 87.9 kg (193 lb 12.6 oz)   01/25/18 86.5 kg (190 lb 11.2 oz)   01/25/18 86.5 kg (190 lb 11.2 oz)   01/13/18 87.7 kg (193 lb 5.5 oz)   01/11/18 86.4 kg (190 lb 7.6 oz)       Subjective/Assessment/Recommendations:    1. Prior radiation: No prior radiation treatment  2. Pacemaker/defibrillator: No  3. Pregnancy: No - history of menopause, hysterectomy, or tubal ligation  4. Patient education: Patient education materials and attending physician packet provided  5. Meaningful use: Reviewed  6. Resources: Nutrition/dietitian, Surveyor, quantity and CCSP  7. Other: pt has some mouth sores and reports that her left breast feels uncomfortable.  She is numb under her arm.

## 2018-02-08 NOTE — Unmapped (Signed)
Radiation Oncology Initial Visit Note    Patient Name: Erika Cabrera  Patient Age: 62 y.o.  Encounter Date: 02/08/2018    Referring Physician:   Dr. Dellis Anes  Surgery    Assessment:   [name] is a 62 y.o. female left breast pT3pN2a, IDC, G2, ER+(95%)/PR+(30%)/HER2-neg, 5/6 LN positive, +LVI and positive surgical margins s/p BCT with targeted axillary LN bx on 07/03/17. Unfortunately her pathology revealed residual disease and she requires re-excision with ALND. Completed this on 08/14/17 showing 7 mm of residual tumor, negative margins, 2/23 +LN with larger tumor deposit of 6 mm. She is now s/p chemotherapy          Recommendations:  [name] has  node positive breast carcinoma. We reviewed the natural history of breast cancer and the therapeutic options available. Due to her node-positive disease, we also recommend radiation therapy to her breast and regional nodes.    We reviewed the risks and potential side effects of radiation treatment which include, but are not exclusive to, the risk for fatigue and skin irritation within the treatment area. Due to the left sided nature of her disease, we discussed her risk of cardiac toxicity and potential strategies that could be employed to protect her heart, including the possibility of deep-inspiration breath hold technique.     We also discussed the logistics including an initial CT Simulation for planning and then 6 weeks of treatment Monday-Friday.     PLAN  1. Plan for 200 cGy x 25 = 5000 cGy to left breast and nodes, boost 200 cGy x 5 = 1000 cGy to tumor bed  2. CT simulation today    --------------------------------------------------------------------------------------------------------------------------      History of Present Illness: Information pertinent to today's evaluation are the following:  See onc history below.    She reports she had significant fatigue, nausea with chemotherapy. Now recovering  Still has significant neuropathy in her hands and legs - had this before chemotherapy ?genetic condition, worsened by chemotherapy  Here with her husband today - who is getting surgery for prostate cancer next week at [facility]    Oncology History    2018: Left breast cT2 N1a, IDC, G1, +/+/-.        Malignant neoplasm of upper-outer quadrant of left breast in female, estrogen receptor positive (CMS-HCC)    06/07/2017 -  Presenting Symptoms     Left breast palpable UOQ mass         06/07/2017 Interval Scan(s)     Diagnostic MMG at [facility]: left breast UOQ mass difficult to measure, but appears to measure at least 5.6 cm.    [place]: left breast ill defined shadowing lesion at 2:00, 6 CFN measuring 3.7 x 2.6 x 2.5 cm.     Single mildly abnormal node in left axilla with constriction of fatty hilum.         06/13/2017 Biopsy     [facility] core biopsy of left breast 2:00, 6 CFN: IDC, G1, ER+(95%), PR+(30%), HER2 negative by FISH.  [facility] core biopsy left axillary LN: +LN metastasis.         06/13/2017 -  Other     Seen in [facility] ED following core biopsy in [facility] for axillary pain with non-expanding 4 cm hematoma noted in left axilla. No intervention required other than symptomatic measures (compression, pain management).         06/20/2017 Tumor Board     [facility] recs: left breast cT2 N1a IDC, G1, receptors  pending. Per outside imaging there was a single abnormal LN found by [place] only. Awaiting receptors. Need SAVI into that clipped node. Discussed that she otherwise fits ACOSOG Z0011 criteria with negative clinical exam prior to biopsy and single abnormal LN on axillary [place]. Reasonable to do SLNB and excise biopsied node as well. If receptors favorable, consider surgery first approach, good BCT candidate. If high risk (TN, HER+) may need NACT approach. Briefly discussed ALTERNATE, but consensus was that she will likely be recommended for chemo given age and nodal positivity. Meeting surgery, rad/onc today. Will circle back with med/onc as needed.         07/03/2017 Surgery Left [facility] localized/US guided partial mastectomy and left SLNbx with ALNDx: Invasive mammary carcinoma with mixed ductal and lobular features present in association with in situ carcinoma with mixed ductal and lobular features measuring 8.3 cm with positive inferior and lateral margin. Sentinel lymph node biopsy: 1/1 node positive for metastatic disease measuring 7 mm. No ECE. Palpable axillary lymph nodes: 4/4 lymph nodes with metastatic disease measuring 10 mm.  No ECE.  Additional palpable axillary lymph node 0/1 with isolated tumor cells. SAVI scout in the axilla was defective and there was no localization signal from the SAVI probe.         08/14/2017 Surgery     Left re-excision, ALND w ARM: Lateral margin: Invasive carcinoma with mixed ductal and lobular features measuring 7mm w LVI, margin negative.   Inferior margin: Multiple foci of lymphovasular invasion present.  No evidence of stromal invasion (final margin negative)  Left ALND: ??Two of twenty-three lymph nodes positive for carcinoma (2/23).  Size of larger tumor deposit: 6 mm.  Extracapsular extension: Absent         09/21/2017 -  Chemotherapy     Started ddTaxol--->ddAC         09/21/2017 Adverse Reaction     First Taxol infused for ~20 minutes resulting in flushing with chest tightness and nausea.  Infusion was stopped and administered.  Symptoms subsided within 20 minutes but she continued to feel some chest tightness with deep inspiration.  Albuterol nebulizer was given.  She then returned to baseline and infusion was restarted at 1/2 rate and titrated up slowly.  She was able to complete infusion without any further events.          10/05/2017 -  Chemotherapy     Switching to Abraxane due to infusion reaction         10/11/2017 Adverse Reaction     Acute DVT Rt interval jugular, brachiocephalic, subclavian vein-now on Eliquis 5 mg qd.          12/05/2017 -  Chemotherapy     Chemotherapy Treatment    Treatment Goal Curative   Line of Treatment [No plan line of treatment]   Plan Name OP BREAST AC (DOSE DENSE)   Start Date 12/07/2017   End Date 01/26/2018   Provider Willeen Niece, MD   Chemotherapy dexamethasone (DECADRON) tablet 12 mg, 12 mg, Oral, Once, 4 of 4 cycles  Administration: 12 mg (12/07/2017), 12 mg (12/21/2017), 12 mg (01/11/2018), 12 mg (01/25/2018)  cyclophosphamide (CYTOXAN) 1,254 mg in sodium chloride (NS) 0.9 % 250 mL IVPB, 600 mg/m2 = 1,254 mg, Intravenous, Once, 4 of 4 cycles  Administration: 1,254 mg (12/07/2017), 1,254 mg (12/21/2017), 1,254 mg (01/11/2018), 1,254 mg (01/25/2018)  DOXOrubicin (ADRIAMYCIN) syringe 125.4 mg, 60 mg/m2 = 125.4 mg, Intravenous, Once, 4 of 4 cycles  Administration: 125.4 mg (  12/07/2017), 125.4 mg (12/21/2017), 125.4 mg (01/11/2018), 125.4 mg (01/25/2018)                 Prior Radiation Therapy:no  Pacemaker: no  Pregnancy status: Negative pregnancy test/infertile    Review of Systems:  All others negative except for any pertinent positives noted in the HPI.       PAST MEDICAL HISTORY:  Past Medical History:   Diagnosis Date   ??? Breast cancer (CMS-HCC) 06/2017    Left-Per bx results in [facility]   ??? Depression    ??? GERD (gastroesophageal reflux disease)        FAMILY HISTORY:  Family History   Problem Relation Age of Onset   ??? No Known Problems Mother    ??? No Known Problems Father    ??? No Known Problems Sister    ??? No Known Problems Daughter    ??? No Known Problems Maternal Grandmother    ??? No Known Problems Maternal Grandfather    ??? No Known Problems Paternal Grandmother    ??? No Known Problems Paternal Grandfather    ??? Breast cancer Paternal Aunt         28s   ??? Colon cancer Paternal Aunt    ??? BRCA 1/2 Neg Hx    ??? Cancer Neg Hx    ??? Endometrial cancer Neg Hx    ??? Ovarian cancer Neg Hx        SOCIAL HISTORY:   Social History     Socioeconomic History   ??? Marital status: Married     Spouse name: Not on file   ??? Number of children: Not on file   ??? Years of education: Not on file   ??? Highest education level: Not on file   Occupational History   ??? Not on file   Social Needs   ??? Financial resource strain: Not on file   ??? Food insecurity:     Worry: Not on file     Inability: Not on file   ??? Transportation needs:     Medical: Not on file     Non-medical: Not on file   Tobacco Use   ??? Smoking status: Never Smoker   ??? Smokeless tobacco: Never Used   Substance and Sexual Activity   ??? Alcohol use: No   ??? Drug use: No   ??? Sexual activity: Not on file   Lifestyle   ??? Physical activity:     Days per week: Not on file     Minutes per session: Not on file   ??? Stress: Not on file   Relationships   ??? Social connections:     Talks on phone: Not on file     Gets together: Not on file     Attends religious service: Not on file     Active member of club or organization: Not on file     Attends meetings of clubs or organizations: Not on file     Relationship status: Not on file   Other Topics Concern   ??? Not on file   Social History Narrative    The patient is married. She lives at home with her husband, Ed.  She has two children       Allergies   Allergen Reactions   ??? Erythromycin Diarrhea   ??? Penicillins Rash       Current Medications:  Current Outpatient Medications   Medication Sig Dispense Refill   ???  acetaminophen (TYLENOL) 500 MG tablet Take 1,000 mg by mouth every four (4) hours as needed for pain.     ??? apixaban (ELIQUIS) 5 mg Tab Take 1 tablet (5 mg total) by mouth Two (2) times a day. 60 tablet 5   ??? buPROPion (WELLBUTRIN XL) 300 MG 24 hr tablet Take 300 mg by mouth daily.     ??? dexamethasone (DECADRON) 4 MG tablet Take 2 tablets (8 mg total) by mouth daily. Take on Days 2, 3, and 4. 24 tablet 0   ??? docusate sodium (COLACE) 100 MG capsule Take 100 mg by mouth daily with breakfast.     ??? FLUoxetine (PROZAC) 10 MG tablet Take 10 mg by mouth daily.     ??? GABAPENTIN ORAL Take 900 mg by mouth nightly. Pt taking 1 capsule at noon and 3 capsules at 8PM     ??? LIDOCAINE 2 % solution   0   ??? lidocaine-diphenhydramine-aluminum-magnesium (FIRST-MOUTHWASH BLM) 200-25-400-40 mg/30 mL Mwsh 10 mL by Mouth route 4 (four) times a day as needed. 500 mL 1   ??? loratadine (CLARITIN ORAL) Take by mouth.     ??? LORazepam (ATIVAN) 0.5 MG tablet Take 1 tablet (0.5 mg total) by mouth nightly as needed (take for difficulty sleeping on nights taking dexamethasone). 30 tablet 0   ??? ondansetron (ZOFRAN) 4 MG tablet Take 2 tablets (8 mg total) by mouth every eight (8) hours as needed for nausea. 30 tablet 2   ??? ondansetron (ZOFRAN) 8 MG tablet Take 1 tablet (8 mg total) by mouth every twelve (12) hours as needed for nausea. 30 tablet 2   ??? polyethylene glycol (MIRALAX) 17 gram packet Take 17 g by mouth daily.     ??? prochlorperazine (COMPAZINE) 10 MG tablet Take 1 tablet (10 mg total) by mouth every six (6) hours as needed (nausea). 30 tablet 2   ??? ranitidine (ZANTAC) 150 MG tablet Take 150 mg by mouth daily.        No current facility-administered medications for this encounter.          Physical Exam:  Karnofsky Performance Status: 90,  Able to carry on normal activity; minor signs or symptoms of disease (ECOG equivalent 0)  General:  No acute distress, alert and oriented X 4   Neuro:  Normal Gait and Cognition. CN II-XII focally intact  HEENT: Moist mucous membranes  Neck: Supple, midline  Cardio: Normal rate.  Breast: Symmetric, no nipple dc or skin changes in both breasts. The right breast was soft without suspicious palpable masses felt.? The left  breast was also soft,?showed a freshly-healed scar in the?peri-areolar region with some subtle benign induration underneath.?    Lungs: Normal work of breathing  MSK: No joint pains or effusions  Psych: Normal mood and affect.  Converses clearly and emotionally appropriate.       RADIOLOGY: Imaging was personally reviewed as detailed in the history (see above).     PATHOLOGY:  Pathology report was personally reviewed as detailed in the HPI.     Lonzo Cloud, MD, MBA  Resident Physician PGY-4  Department of Radiation Oncology  Pager: [phone]      I saw and evaluated and examined the patient, participating in the key portions of the service. I discussed the findings, assessment, and plan of care with the provider and patient. I agree with the findings and plan as documented in the note.     Trena Platt MD MS MPH  Assistant Professor  Department of Radiation Oncology  [place] [facility] [place] [facility]

## 2018-02-12 ENCOUNTER — Encounter
Admit: 2018-02-12 | Discharge: 2018-02-21 | Payer: PRIVATE HEALTH INSURANCE | Attending: Rehabilitative and Restorative Service Providers" | Primary: Rehabilitative and Restorative Service Providers"

## 2018-02-12 DIAGNOSIS — Z170 Estrogen receptor positive status [ER+]: Secondary | ICD-10-CM

## 2018-02-12 DIAGNOSIS — C50412 Malignant neoplasm of upper-outer quadrant of left female breast: Principal | ICD-10-CM

## 2018-02-12 DIAGNOSIS — M5126 Other intervertebral disc displacement, lumbar region: Secondary | ICD-10-CM

## 2018-02-12 NOTE — Unmapped (Signed)
OUTPATIENT PHYSICALTHERAPY  Upper Quadrant Lymphedema   Progress Note  Date:02/12/2018    Patient Name: Erika Cabrera  Date of Birth:1956-05-11  Session #:11  Diagnosis:   Encounter Diagnoses   Name Primary?   ??? Malignant neoplasm of upper-outer quadrant of left breast in female, estrogen receptor positive (CMS-HCC) Yes   ??? Bulging lumbar disc        Referring Provider: Ophelia Shoulder*    Initial Evaluation  : 10/25/17     New POC 7/9-10/1/19  Contraindications:  none    Precautions/Red Flags:  history of cancer      ASSESSMENT/Reason for Referral:    62 y.o. female presents with Stage  II  lymphedema secondary in chest and under axilla.  She is sig fatigued after last chemo, but is feeling better than last week. Will begin radiation 7/22. Will  See patient 1x week for 6 weeks during radiation followed by a 6 week post radiation follow up. She is also complaining of some stress UI symptoms recently. I taught pt the [facility] tech and will reassess next session to see if it helps. If not, may recommend pelvic floor assessment. She has script from MD for this.  Overall cording sig improved, however still limits overhead movement.  Lymphatouch performed today helps with cording.  Patient requires skilled Physical Therapy services  for the following problem list and secondary functional limitations:    Problem List:   Lack of knowledge of lymphedema and self care of this condition, Increased risk of infection, Lack of appropriate compression garment and Uncontrolled lymphedema swelling    Secondary Functional Limitations:  Decreased  ROM of arm and Scar/Myofascial  immobility    Barriers to learning:  no      Patient Goals: Decrease swelling, obtain a compression garment and decrease stiffness    Physical Therapy Goals:   In 3-4 sessions    Pt will have knowledge of proper skin care and lymphedema risk precautions to reduce risk of infection.   Met 01/17/18  Patient will be able to properly demonstrate self-manual lymphatic drainage x1 in clinic to assist in management of lymphedema swelling progressing- needs review 01/17/18   Pt. will independently don/doff and tolerate compression garment or compression bandages x1 in clinic  to ensure independent management of lymphedema.  Progressing - will wait until after radiation to assess need of compression bra.   Patient will demonstrate proper posture  to minimize pain and tightness in the affected quadrant.   MET 02/12/18  MLD will be performed clinically to improve lymphangiomotoricity, thereby rerouting lymphatic flow to accomodate cancer treatment effects on the lymphatic system. Met 01/17/18    In 1-6 months:    Pt will be independent with all self care related to lymphedema.  Progressing 01/17/18       Prognosis for goal achievement:   Good due to good motivation.      PLAN:    mechanics, taping, and pump/ equipment  recommendations     Planned frequency and duration  of treatment:   1x week for 6 weeks during radiation  Then follow up check 6 weeks post radiation.        SUBJECTIVE:  Patient???s communication preference: verbal, written, visual, prn      Pt reports that the last infusion  really knocked me for a loop. I could not get out of bed for a week practicically. I felt like I was going to faint for 3-4 days. Reports Nausea was bad, but  didn't throw up.   Numbness and tingling still bad.  Dr Avis Epley said she would refer to pllative  Pain control.  Pt reports that she gets stress incontinence with coughing laughing sneezing.      3/10 discomfort from neuropathy reported today.     Onset of swelling:  Since drains removed.      History of Present Illness/ Pt reports:   Pt reports recurrent Seroma since drains removed . Has been drained multiple times 1s time 360 ml, second time 310,  35 mil, 3rd time 55 ml, 4th time 48 ml.  Pt reports has not noticed swelling in extremity.  Some ROM  Deficits/ tightness noted.     Home environment:       Lives with family. Has two kids in college   Sleeping position: bed     Pt has caregiver available, capable and willing to help:yes     Occupation/ Activities/Recreation:        Occupational History   ??? Not on file       Prior Functional Status:   Independent    Current Functional Limitations: The patient reports the following limitations based on verbal description and a Quick DASH score of  which indicates 6% impairment of the upper extremity:  arm, shoulder, and hand pain    Sexual Dysfunction or Bowel or Bladder changes : none    History of infections: no    Previous Lymphedema Treatment Type: none    Current management: n/a       Medical hx/conditions/surgical procedures, medications, allergies:    Reviewed      Recent Procedures/Tests/Findings:  Oncology History    2018: Left breast cT2 N1a, IDC, G1, +/+/-.        Malignant neoplasm of upper-outer quadrant of left breast in female, estrogen receptor positive (CMS-HCC)    06/07/2017 -  Presenting Symptoms     Left breast palpable UOQ mass         06/07/2017 Interval Scan(s)     Diagnostic MMG at [facility]: left breast UOQ mass difficult to measure, but appears to measure at least 5.6 cm.    [place]: left breast ill defined shadowing lesion at 2:00, 6 CFN measuring 3.7 x 2.6 x 2.5 cm.     Single mildly abnormal node in left axilla with constriction of fatty hilum.         06/13/2017 Biopsy     [facility] core biopsy of left breast 2:00, 6 CFN: IDC, G1, ER+(95%), PR+(30%), HER2 negative by FISH.  [facility] core biopsy left axillary LN: +LN metastasis.         06/13/2017 -  Other     Seen in [facility] ED following core biopsy in [facility] for axillary pain with non-expanding 4 cm hematoma noted in left axilla. No intervention required other than symptomatic measures (compression, pain management).         06/20/2017 Tumor Board     [facility] recs: left breast cT2 N1a IDC, G1, receptors pending. Per outside imaging there was a single abnormal LN found by [place] only. Awaiting receptors. Need SAVI into that clipped node. Discussed that she otherwise fits ACOSOG Z0011 criteria with negative clinical exam prior to biopsy and single abnormal LN on axillary [place]. Reasonable to do SLNB and excise biopsied node as well. If receptors favorable, consider surgery first approach, good BCT candidate. If high risk (TN, HER+) may need NACT approach. Briefly discussed ALTERNATE, but consensus was that she will likely be recommended  for chemo given age and nodal positivity. Meeting surgery, rad/onc today. Will circle back with med/onc as needed.         07/03/2017 Surgery     Left [facility] localized/US guided partial mastectomy and left SLNbx with ALNDx: Invasive mammary carcinoma with mixed ductal and lobular features present in association with in situ carcinoma with mixed ductal and lobular features measuring 8.3 cm with positive inferior and lateral margin. Sentinel lymph node biopsy: 1/1 node positive for metastatic disease measuring 7 mm. No ECE. Palpable axillary lymph nodes: 4/4 lymph nodes with metastatic disease measuring 10 mm.  No ECE.  Additional palpable axillary lymph node 0/1 with isolated tumor cells. SAVI scout in the axilla was defective and there was no localization signal from the SAVI probe.         08/14/2017 Surgery     Left re-excision, ALND w ARM: Lateral margin: Invasive carcinoma with mixed ductal and lobular features measuring 7mm w LVI, margin negative.   Inferior margin: Multiple foci of lymphovasular invasion present.  No evidence of stromal invasion (final margin negative)  Left ALND: ??Two of twenty-three lymph nodes positive for carcinoma (2/23).  Size of larger tumor deposit: 6 mm.  Extracapsular extension: Absent         09/21/2017 -  Chemotherapy     Started ddTaxol--->ddAC         09/21/2017 Adverse Reaction     First Taxol infused for ~20 minutes resulting in flushing with chest tightness and nausea.  Infusion was stopped and administered.  Symptoms subsided within 20 minutes but she continued to feel some chest tightness with deep inspiration.  Albuterol nebulizer was given.  She then returned to baseline and infusion was restarted at 1/2 rate and titrated up slowly.  She was able to complete infusion without any further events.          10/05/2017 -  Chemotherapy     Switching to Abraxane due to infusion reaction         10/11/2017 Adverse Reaction     Acute DVT Rt interval jugular, brachiocephalic, subclavian vein-now on Eliquis 5 mg qd.          12/05/2017 -  Chemotherapy     Chemotherapy Treatment    Treatment Goal Curative   Line of Treatment [No plan line of treatment]   Plan Name OP BREAST AC (DOSE DENSE)   Start Date 12/07/2017   End Date 01/26/2018   Provider Willeen Niece, MD   Chemotherapy dexamethasone (DECADRON) tablet 12 mg, 12 mg, Oral, Once, 4 of 4 cycles  Administration: 12 mg (12/07/2017), 12 mg (12/21/2017), 12 mg (01/11/2018), 12 mg (01/25/2018)  cyclophosphamide (CYTOXAN) 1,254 mg in sodium chloride (NS) 0.9 % 250 mL IVPB, 600 mg/m2 = 1,254 mg, Intravenous, Once, 4 of 4 cycles  Administration: 1,254 mg (12/07/2017), 1,254 mg (12/21/2017), 1,254 mg (01/11/2018), 1,254 mg (01/25/2018)  DOXOrubicin (ADRIAMYCIN) syringe 125.4 mg, 60 mg/m2 = 125.4 mg, Intravenous, Once, 4 of 4 cycles  Administration: 125.4 mg (12/07/2017), 125.4 mg (12/21/2017), 125.4 mg (01/11/2018), 125.4 mg (01/25/2018)                 OBJECTIVE:        Photos 12/24/17     Girth Measurements:      DYNAMIC GAIT INDEX    Grading: record the lowest category that applies.   1.Gait level surface: 3  Instructions: Walk at your normal speed from here to the next mark (20???).     (  3) Normal: walks 20???, no assistive devices, good speed, no evidence for imbalance, normal gait pattern.   (2) Mild impairment: walks 20???, uses assistive devices, slower speed, mild gait deviations.   (1) Moderate impairment: walks 20???, slow speed, abnormal gait patters, evidence for imbalance.   (0) Severe impairment: cannot walk 20??? without assistance, severe gait deviations or imbalance.     2.Change in gait speed: 3  Instructions: Begin walking at your normal pace (for 5???), when I tell you ???go???, walk as fast as you can (for 5???). When I tell you ???slow???, walk as slowly as you can (for 5???).     (3) Normal: Able to smoothly change walking speed without loss of balance or gait deviation. Shows significant difference in walking speeds between normal, fast and slow paces.   (2) Mild impairment: Is able to change speed but demonstrates mild gait deviations, or no gait deviations but unable to achieve a significant change in velocity, or uses as assistive device.   (1) Moderate impairment: Makes only minor adjustments to walking speed, or accomplishes a change in speed with significant gait deviations, or changes speed but loses balance but is able to recover and continue walking.   (0) Severe impairment: Cannot change speeds, or loss balance and has to reach for a wall or be caught.     3.Gait with horizontal head turns: 1  Instructions: Begin walking at your normal pace. When I tell you to ???look right???, keep walking straight, but turn your head to the right. Keep looking to the right until I tell you ???look left???, then keep walking straight and turn your head to the left. Keep your head to the left until I tell you, ???look straight???, then keep walking straight, but return your head to the centre.     (3) Normal: Performs head turns smoothly with no change in gait.   (2) Mild impairment: Performs head turns smoothly with slight change in gait velocity, i.e. minor disruption to smooth gait path or uses walking aid.   (1) Moderate impairment: Performs head turns with moderate change in gait velocity, slows down, staggers, but recovers, can continue to walk.   (0) Severe impairment: Performs task with severe disruption of gait, i.e. staggers outside 15??? path, loses balance, stops, reaches for wall.     4.Gait with vertical head turns:3  Instructions: Begin walking at your normal pace. When I tell you to ???look up???, keep walking straight, but tip your head and look up. Keep looking up until I tell you, ???look down???. Then keep walking straight and turn your head down. Keep looking down until I tell you, ??? look straight???, then keep walking straight, but return your head to the centre.     (3) Normal: Performs head turns smoothly with no change in gait.   (2) Mild impairment: Performs head turns smoothly with slight change in gait velocity, i.e. minor disruption to smooth gait path or uses walking aid.   (1) Moderate impairment: Performs head turns with moderate change in gait velocity, slows down, staggers, but recovers, can continue to walk.   (0) Severe impairment: Performs task with severe disruption of gait, i.e. staggers outside 15??? path, loses balance, stops, reaches for wall.     5.Gait and pivot turn: 1  Instructions: Begin walking at your normal pace. When I tell you, ???turn and stop???, turn as quickly as you can to face the opposite direction and stop.     (3) Normal: Pivot turns safely  within 3 seconds and stops quickly with no loss of balance.   (2) Mild impairment: pivot turns safely in >3 seconds and stops with no loss of balance.   (1) Moderate impairment: Turns slowly, requires verbal cueing, requires several small steps to catch balance following turn and stop.   (0) Severe impairment: Cannot turn safely, requires assistance to turn and stop.     6.Step over obstacle: 2  Instructions: Begin walking at your normal speed. When you come to the shoebox, step over it, not around it, and keep walking.     (3) Normal: Is able to step over box without changing gait speed; no evidence for imbalance.   (2) Mild impairment: Is able to step over shoe box, but must slow down and adjust steps to clear box safely.   (1) Moderate impairment: Is able to step over box but must stop, then step over. May require verbal cueing.   (0) Severe impairment: Cannot perform without assistance.     7.Step around obstacles: 3  Instructions: Begin walking at normal speed. When you come to the first cone (about 6??? away), walk around the right side of it. When you some to the second cone (6??? past first cone), walk around it to the left.     (3) Normal: Is able to walk safely around cones safely without changing gait speed; no evidence of imbalance.   (2) Mild impairment: Is able to step around both cones, but must slow down and adjust steps to clear cones.   (1) Moderate impairment: Is able to clear cones but must significantly slow speed to accomplish task, or requires verbal cueing.   (0) Severe impairment: Unable to clear cones, walks into one or both cones, or requires physical assistance.     8.Steps: 2  Instructions: Walk up these stairs as you would at home.(i.e. using a rail if necessary. At the top, turn around and walk down.     (3) Normal: Alternating feet, no rail.   (2) Mild impairment: Alternating feet, must use rail.   (1) Moderate impairment: Two feet to a stair, must use rail.   (0) Severe impairment: Cannot do safely.     TOTAL SCORE: 18/24  F:\Intranet\[facility] website\physiotherapy section\Dynamic Gait Index v.doc       Location of swelling:  L chest and axilla.     Skin: WFL except for the following:  increased skin thickness    Stemmer???s sign:no.      Sensation Intact to light touch:   Decreased sensation under axilla, around scar, and posterior lateral upper arm     Incision: healing appropriately for post op status, adhered to underlying tissue    Axillary Web Syndrome::yes     Posture/Observations:  Forward head, rounded shoulders        Range of Motion/Flexibilty:   UE WFL except  End range overhead activity.     Strength/MMT:   UE WFL     Total Treatment Time: 60 min        Treatment Rendered:      Manual therapy  X 45 min    - LUQ MLD anterior anastamosis only.    - lymphatouch RUQ  Self Care x 15 min    -  Taught pt [employer] tech to help with stress urinary incontinence.      Today's Charges (noted here with $$):       ADLs/IADLs   $$ PT Self Care/Home Management Training [mins]: 15  Therapeutic Interventions  $$  Manual Therapy [mins]: 45               Equipment provided/recommended:   written HEP    Communication/consultation with other professionals:  Email to DME supplier [employer] re: garment needs        I attest that I have reviewed the above information.  Signed: Kirke Corin, PT   02/12/2018 9:01 PM

## 2018-02-22 DIAGNOSIS — Z510 Encounter for antineoplastic radiation therapy: Principal | ICD-10-CM

## 2018-02-22 NOTE — Unmapped (Signed)
Radiation Treatment Management Note    Visit Date: 02/22/2018   Patient Name: Erika Cabrera   Date of Birth: 01/15/1956   MRN: [id number]  Age: 62 y.o.    Identifying Information and Diagnosis:  Breast cancer    Treatment Plan: Sybol L Towle??is a??62 y.o.??female??left breast pT3pN2a, IDC, G2, ER+(95%)/PR+(30%)/HER2-neg,??5/6 LN positive, +LVI and positive surgical margins s/p BCT with targeted axillary LN bx on 07/03/17. Unfortunately her pathology revealed residual disease and she requires re-excision with ALND. Completed this on 08/14/17 showing 7 mm of residual tumor, negative margins, 2/23 +LN with larger tumor deposit of 6 mm. She is now s/p chemotherapy. Refer to consult note for full clinical details.    Summary of Radiotherapy  Left breast and L [place] fossa  1/25 fractions  200cGy 5000      Subjective and Interval History  First week. No issues. Skin care reviewed as well as general flow during RT.    She is interested in Eek, discussed that we may add Mometasone in future.        PHYSICAL EXAMINATION:  BP 105/78  - Pulse 96  - Temp 36.2 ??C (97.2 ??F) (Oral)  - Resp 20  - Wt 81.8 kg (180 lb 6.4 oz)  - SpO2 96%  - BMI 27.98 kg/m??      GENERAL:  Reveals a well-developed, well-nourished, post chemo and surgery.  SKIN:  First day, deferred     Trena Platt MD MS MPH  Assistant Professor  Department of Radiation Oncology  [facility]

## 2018-02-25 ENCOUNTER — Ambulatory Visit: Admit: 2018-02-25 | Discharge: 2018-02-26

## 2018-02-25 DIAGNOSIS — Z170 Estrogen receptor positive status [ER+]: Secondary | ICD-10-CM

## 2018-02-25 DIAGNOSIS — C50412 Malignant neoplasm of upper-outer quadrant of left female breast: Principal | ICD-10-CM

## 2018-02-25 NOTE — Unmapped (Signed)
02/25/2018    Dose Site Summary:Rx:LtSclav: 02/25/2018: 200/5,000 cGy  ZO:XWRUEAVW: 02/25/2018: 200/5,000 cGy    Subjective/Assessment/Recommendations:here for initial status check    1. Skin: No problems and Intact  2. Lymphedema: Not present  3. Nutrition: No issues and Eating well  4. Fatigue: No issues  5. Pain: Denies pain  6. Elimination: Bowel Diarrhea  7. Prescription Needs: None  8. Psychosocial: No issues and Has home support  9. Other: NA

## 2018-02-26 ENCOUNTER — Ambulatory Visit: Admit: 2018-02-26 | Discharge: 2018-02-27

## 2018-02-27 ENCOUNTER — Ambulatory Visit: Admit: 2018-02-27 | Discharge: 2018-02-28

## 2018-02-27 ENCOUNTER — Encounter: Admit: 2018-02-27 | Discharge: 2018-02-28 | Payer: PRIVATE HEALTH INSURANCE

## 2018-02-27 DIAGNOSIS — F3341 Major depressive disorder, recurrent, in partial remission: Secondary | ICD-10-CM

## 2018-02-27 DIAGNOSIS — G629 Polyneuropathy, unspecified: Secondary | ICD-10-CM

## 2018-02-27 DIAGNOSIS — C50412 Malignant neoplasm of upper-outer quadrant of left female breast: Principal | ICD-10-CM

## 2018-02-27 DIAGNOSIS — Z170 Estrogen receptor positive status [ER+]: Secondary | ICD-10-CM

## 2018-02-27 MED ORDER — DULOXETINE 20 MG CAPSULE,DELAYED RELEASE
ORAL_CAPSULE | Freq: Every day | ORAL | 0 refills | 0 days | Status: CP
Start: 2018-02-27 — End: 2018-03-25

## 2018-02-27 NOTE — Unmapped (Addendum)
It was great to meet you today.     Medication changes:    - start Cymbalta 20mg  once a day  - stop Fluoxetine  - take gabapentin 900mg  at night (no longer taking around noon)    I will see you in about a month    In between appointments, do not hesitate to contact us at:    [facility]  [address], [place] [address], [address]  [place], [address]  Phone: [phone]  Toll Free: [phone]  Fax: [phone]    Assistant to Dr. Hulan Fess:  Lolita Cram [phone]    After Hours, Weekend, or Holidays:  ?? Call the [facility] 24/7 Nursing Line [phone] or toll free [phone] to get nurse advice.  ?? Go to [facility] walk-in clinic at [address], [address], [place], [place]; [phone]; 7 days a week from 9:00AM - 8:00PM.  ?? Go to [facility] for sprains and strains, joint pain, sports injuries and possible fractures at [address], [place], [place]; [phone]; Mon-Thurs 8:00AM-7:00PM, Fri 8:00AM-5:00PM     Financial Counselor: [phone]

## 2018-02-27 NOTE — Unmapped (Signed)
[facility]  New Patient Visit      HPI:     This is a 62 y.o. year old female with PMHx significant for breast CA (ER+/PR+/HER2-neg, dx 06/2017) s/p lumpectomy, additional excisions, LN dissection, chemo, currently undergoing rad therapy, depression, peripheral neuropathy who presents to clinic today for establishment of care.    Last year moved to [facility] area after she and husband lost their jobs.  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  Moved to [facility] where her mother-in-law lives.  Husband has found a job, she is still looking.  Previous primary care at [facility].   Dx with breast cancer in 06/2017.  Has since transferred her care to [facility].  Finished 2nd round of chemotherapy in June and reports that it was really rough with nausea and extreme fatigue.    Recently started radiation therapy.    Treatment has been complicated by port-associated blood clots (October 11 2017), now anticoagulated with Eliquis, presumably until prot removed.     Stool softner every day due to prior issues with hemorrhoids and anal fissure for which she had undergone therapy.     Major issues she would like to address today  1. Neuropathy - holding back from being active. Dx in 20s, primarily in feet.  Since chemo has now included tips of fingers.  Feels it all the time.  Currently takes gabapentin, but doesn't seem to make much of an impact except making her sleepy.  2. Prescriptions refills   3.  Depression - would eventually like to consider tapering off these medications, but willing to Erika Cabrera that after she finishes radiation therapy.     Husband with prostate cancer, just had surgery. Not yet undergoing radiation or chemo.  Margins were clean.  PSA q3 months. Was able to get a job    Two kids  31yo in [place] [place] for last 6 months.  Coming home this Saturday. Stationed at [place].  Wife and daughter 2yo.      27yo son in [place]., Works for [employer].       Twin brother with diabetes    A1 in prediabetes range in Nov 2018 - lost lots of weight (40lbs since then)  Last physical exam Nov 2018    Specialists:  Erika Cabrera)  Erika Cabrera)      Current Medications:     Current Outpatient Medications   Medication Sig Dispense Refill   ??? acetaminophen (TYLENOL) 500 MG tablet Take 1,000 mg by mouth every four (4) hours as needed for pain.     ??? apixaban (ELIQUIS) 5 mg Tab Take 1 tablet (5 mg total) by mouth Two (2) times a day. 60 tablet 5   ??? buPROPion (WELLBUTRIN XL) 300 MG 24 hr tablet Take 300 mg by mouth daily.     ??? docusate sodium (COLACE) 100 MG capsule Take 100 mg by mouth daily with breakfast.     ??? DULoxetine (CYMBALTA) 20 MG capsule Take 1 capsule (20 mg total) by mouth daily. 30 capsule 0   ??? GABAPENTIN ORAL Take 900 mg by mouth nightly. Pt taking 1 capsule at noon and 3 capsules at 8PM     ??? polyethylene glycol (MIRALAX) 17 gram packet Take 17 g by mouth daily as needed.  0   ??? ranitidine (ZANTAC) 150 MG tablet Take 150 mg by mouth daily.        No current facility-administered medications for this visit.  Allergies:     Allergies   Allergen Reactions   ??? Erythromycin Diarrhea   ??? Penicillins Rash        Review of Systems:   ROS as per HPI.    Past Medical History:     Past Medical History:   Diagnosis Date   ??? Breast cancer (CMS-HCC) 06/2017    Left-Per bx results in [facility]   ??? Depression    ??? GERD (gastroesophageal reflux disease)    ??? Neuropathy     Since 20's.  Progressively worse.        Past Surgical History:     Past Surgical History:   Procedure Laterality Date   ??? ANAL FISSURECTOMY     ??? BREAST BIOPSY Left 06/2017    malignant   ??? BREAST BIOPSY Left 06/2017    Lympnode bx-positive   ??? BREAST LUMPECTOMY Left 06/2017   ??? CHEMOTHERAPY     ??? IR INSERT PORT AGE GREATER THAN 5 YRS  09/28/2017    IR INSERT PORT AGE GREATER THAN 5 YRS 09/28/2017 Erika Barer, [facility]   ??? PR BX/REMV,LYMPH NODE,DEEP AXILL Left 07/03/2017    Procedure: BX/EXC LYMPH NODE; OPEN, DEEP AXILRY NODE;  Surgeon: Erika Cage, Erika Cabrera;  Location: ASC OR [facility];  Service: Surgical Cabrera   ??? PR INTRAOPERATIVE SENTINEL LYMPH NODE ID W DYE INJECTION Left 07/03/2017    Procedure: INTRAOPERATIVE IDENTIFICATION SENTINEL LYMPH NODE(S) INCLUDE INJECTION NON-RADIOACTIVE DYE, WHEN PERFORMED;  Surgeon: Erika Cage, Erika Cabrera;  Location: ASC OR [facility];  Service: Surgical [facility]rera   ??? PR INTRAOPERATIVE SENTINEL LYMPH NODE ID W DYE INJECTION Left 08/14/2017    Procedure: INTRAOPERATIVE IDENTIFICATION SENTINEL LYMPH NODE(S) INCLUDE INJECTION NON-RADIOACTIVE DYE, WHEN PERFORMED;  Surgeon: Erika Cage, Erika Cabrera;  Location: ASC OR [facility];  Service: Surgical [facility] Breast   ??? PR MASTECTOMY, PARTIAL Left 07/03/2017    Procedure: MASTECTOMY, savi scout PARTIAL (EG, LUMPECTOMY, TYLECTOMY, QUADRANTECTOMY, SEGMENTECTOMY);  Surgeon: Erika Cage, Erika Cabrera;  Location: ASC OR [facility];  Service: Surgical Cabrera   ??? PR MASTECTOMY, PARTIAL Left 08/14/2017    Procedure: MASTECTOMY, PARTIAL (EG, LUMPECTOMY, TYLECTOMY, QUADRANTECTOMY, SEGMENTECTOMY) Re-excision;  Surgeon: Erika Cage, Erika Cabrera;  Location: ASC OR [facility];  Service: Surgical [facility] Breast   ??? PR REMOVE ARMPITS LYMPH NODES COMPLT Left 08/14/2017    Procedure: AXILLARY LYMPHADENECTOMY; COMPLETE;  Surgeon: Erika Cage, Erika Cabrera;  Location: ASC OR [facility];  Service: Surgical [facility] Breast         Family History:     Family History   Problem Relation Age of Onset   ??? No Known Problems Mother    ??? No Known Problems Father    ??? No Known Problems Sister    ??? No Known Problems Daughter    ??? No Known Problems Maternal Grandmother    ??? No Known Problems Maternal Grandfather    ??? No Known Problems Paternal Grandmother    ??? No Known Problems Paternal Grandfather    ??? Breast cancer Paternal Aunt         89s   ??? Colon cancer Paternal Aunt    ??? BRCA 1/2 Neg Hx    ??? Cancer Neg Hx    ??? Endometrial cancer Neg Hx    ??? Ovarian cancer Neg Hx          Social History:     Social History     Socioeconomic History   ??? Marital status: Married  Spouse name: None   ??? Number of children: None   ??? Years of education: None   ??? Highest education level: None   Occupational History   ??? None   Social Needs   ??? Financial resource strain: None   ??? Food insecurity:     Worry: None     Inability: None   ??? Transportation needs:     Medical: None     Non-medical: None   Tobacco Use   ??? Smoking status: Never Smoker   ??? Smokeless tobacco: Never Used   Substance and Sexual Activity   ??? Alcohol use: No   ??? Drug use: No   ??? Sexual activity: None   Lifestyle   ??? Physical activity:     Days per week: None     Minutes per session: None   ??? Stress: None   Relationships   ??? Social connections:     Talks on phone: None     Gets together: None     Attends religious service: None     Active member of club or organization: None     Attends meetings of clubs or organizations: None     Relationship status: None   Other Topics Concern   ??? None   Social History Narrative    The patient is married. She lives at home with her husband, Erika Cabrera.  She has two children       Physical Exam:     Vital Signs:    Vitals:    02/27/18 1101   BP: 110/66   Pulse: 86   Temp: 36.9 ??C (98.5 ??F)   SpO2: 100%   Weight: 82.6 kg (182 lb 3.2 oz)   Height: 171 cm (5' 7.32)     BP Readings from Last 3 Encounters:   02/27/18 110/66   02/25/18 105/78   02/08/18 104/64      Wt Readings from Last 3 Encounters:   02/27/18 82.6 kg (182 lb 3.2 oz)   02/25/18 81.8 kg (180 lb 6.4 oz)   02/08/18 82.9 kg (182 lb 11.2 oz)     Body mass index is 28.26 kg/m??.    General Appearrance: Alert, NAD, appears well nourished  HEENT: EOMI, conjunctiva clear, no icterus, nares patent without rhinorrhea, oropharyx without exudate or erythema  Heart: S1, S2, no murmurs rubs or gallops. No JVD.  Lungs: CTAB, no increased WOB  GI:  Soft, NT/ND. Normoactive bowel sounds  Extremities: No pitting edema  Psych: No agitation, anxiety, or depression  Neuro: A&Ox4, no gross motor or sensory deficits.  Skin: no new rashes or lesions. +Hair loss. Port I upper chest    PHQ9: deferred answering at this time    Assessment & Plan:   Erika Cabrera is a 62 y.o. female with PMH of breast CA (ER+/PR+/HER2-neg, dx 06/2017) s/p lumpectomy, additional excisions, LN dissection, chemo, currently undergoing rad therapy, depression, peripheral neuropathy who presents for establishment of care and depression, peripheral neuropathy    Maysel was seen today for establish care.    Diagnoses and all orders for this visit:    Malignant neoplasm of upper-outer quadrant of left breast in female, estrogen receptor positive (CMS-HCC)  Managed by Dr. Avis Epley [facility])  - continue radiation therapy    Recurrent major depressive disorder, in partial remission (CMS-HCC)  Deferred PHQ9 at this time. Attempt to complete at next visit.   -     DULoxetine (CYMBALTA) 20 MG capsule; Take 1 capsule (20  mg total) by mouth daily. For depression and neuropathic pain.   - stop fluoxetine  - continue wellbutrin with plans to raper as increase cymbalta if effective    Peripheral polyneuropathy  Idiopathic in bilateral feet since 20s, extended into hands with chemotherapy. Despite gabapentin therapy (unable to uptitrate due to somnolence), has prominent symptoms  -     DULoxetine (CYMBALTA) 20 MG capsule; Take 1 capsule (20 mg total) by mouth daily. Plan to increase if no side effects  - decrease to gabapentin 900mg  qhs (stop noon dose)    Other orders  -     Health Maintenance Outside Records Request; Future - for colonoscopy         Follow up: Return in about 4 weeks (around 03/27/2018).

## 2018-02-28 ENCOUNTER — Encounter
Admit: 2018-02-28 | Discharge: 2018-03-23 | Payer: PRIVATE HEALTH INSURANCE | Attending: Rehabilitative and Restorative Service Providers" | Primary: Rehabilitative and Restorative Service Providers"

## 2018-02-28 ENCOUNTER — Ambulatory Visit
Admit: 2018-02-28 | Discharge: 2018-03-23 | Payer: PRIVATE HEALTH INSURANCE | Attending: Rehabilitative and Restorative Service Providers" | Primary: Rehabilitative and Restorative Service Providers"

## 2018-02-28 ENCOUNTER — Ambulatory Visit: Admit: 2018-02-28 | Discharge: 2018-03-01

## 2018-02-28 DIAGNOSIS — Z170 Estrogen receptor positive status [ER+]: Secondary | ICD-10-CM

## 2018-02-28 DIAGNOSIS — C50412 Malignant neoplasm of upper-outer quadrant of left female breast: Principal | ICD-10-CM

## 2018-02-28 DIAGNOSIS — I890 Lymphedema, not elsewhere classified: Secondary | ICD-10-CM

## 2018-02-28 NOTE — Unmapped (Signed)
OUTPATIENT PHYSICALTHERAPY  Upper Quadrant Lymphedema   Progress Note  Date:02/28/2018    Patient Name: Erika Cabrera  Date of Birth:05/10/1956  Session #:11  Diagnosis:   Encounter Diagnoses   Name Primary?   ??? Malignant neoplasm of upper-outer quadrant of left breast in female, estrogen receptor positive (CMS-HCC) Yes   ??? Lymphedema        Referring Provider: Ophelia Shoulder*    Initial Evaluation  : 10/25/17     New POC 7/9-10/1/19  Contraindications:  none    Precautions/Red Flags:  history of cancer      ASSESSMENT/Reason for Referral:    62 y.o. female presents with Stage  II  lymphedema secondary in chest and under axilla. Began radiation on Monday and appears to tolerate it well so far. No skin integrity issues noted as of yet. Tolerates MLD and lymphatouch well today. KNACK tech  Seems to be helping with UI symptoms. Pt has said she has not had any UI symptoms since  Stopping Ativan . Will cont to monitor. Will also monitor patient's balance.     Problem List:   Lack of knowledge of lymphedema and self care of this condition, Increased risk of infection, Lack of appropriate compression garment and Uncontrolled lymphedema swelling    Secondary Functional Limitations:  Decreased  ROM of arm and Scar/Myofascial  immobility    Barriers to learning:  no      Patient Goals: Decrease swelling, obtain a compression garment and decrease stiffness    Physical Therapy Goals:   In 3-4 sessions    Pt will have knowledge of proper skin care and lymphedema risk precautions to reduce risk of infection.   Met 01/17/18  Patient will be able to properly demonstrate self-manual lymphatic drainage x1 in clinic to assist in management of lymphedema swelling progressing- needs review 01/17/18   Pt. will independently don/doff and tolerate compression garment or compression bandages x1 in clinic  to ensure independent management of lymphedema.  Progressing - will wait until after radiation to assess need of compression bra. Patient will demonstrate proper posture  to minimize pain and tightness in the affected quadrant.   MET 02/12/18  MLD will be performed clinically to improve lymphangiomotoricity, thereby rerouting lymphatic flow to accomodate cancer treatment effects on the lymphatic system. Met 01/17/18    In 1-6 months:    Pt will be independent with all self care related to lymphedema.  Progressing 01/17/18       Prognosis for goal achievement:   Good due to good motivation.      PLAN:    mechanics, taping, and pump/ equipment  recommendations     Planned frequency and duration  of treatment:   1x week for 6 weeks during radiation  Then follow up check 6 weeks post radiation.        SUBJECTIVE:  Patient???s communication preference: verbal, written, visual, prn   Pt reports no longer waking up and having wet underwear. Reports she thinks this may be due to no longer taking Ativan.     3/10 discomfort from neuropathy reported today.     Onset of swelling:  Since drains removed.      History of Present Illness/ Pt reports:   Pt reports recurrent Seroma since drains removed . Has been drained multiple times 1s time 360 ml, second time 310,  35 mil, 3rd time 55 ml, 4th time 48 ml.  Pt reports has not noticed swelling in extremity.  Some ROM  Deficits/ tightness noted.     Home environment:       Lives with family. Has two kids in college   Sleeping position: bed     Pt has caregiver available, capable and willing to help:yes     Occupation/ Activities/Recreation:        Occupational History   ??? Not on file       Prior Functional Status:   Independent    Current Functional Limitations: The patient reports the following limitations based on verbal description and a Quick DASH score of  which indicates 6% impairment of the upper extremity:  arm, shoulder, and hand pain    Sexual Dysfunction or Bowel or Bladder changes : none    History of infections: no    Previous Lymphedema Treatment Type: none    Current management: n/a       Medical hx/conditions/surgical procedures, medications, allergies:    Reviewed      Recent Procedures/Tests/Findings:  Oncology History    2018: Left breast cT2 N1a, IDC, G1, +/+/-.        Malignant neoplasm of upper-outer quadrant of left breast in female, estrogen receptor positive (CMS-HCC)    06/07/2017 -  Presenting Symptoms     Left breast palpable UOQ mass      06/07/2017 Interval Scan(s)     Diagnostic [facility] at [facility]: left breast UOQ mass difficult to measure, but appears to measure at least 5.6 cm.    [place]: left breast ill defined shadowing lesion at 2:00, 6 CFN measuring 3.7 x 2.6 x 2.5 cm.     Single mildly abnormal node in left axilla with constriction of fatty hilum.      06/13/2017 Biopsy     [facility] core biopsy of left breast 2:00, 6 CFN: IDC, G1, ER+(95%), PR+(30%), HER2 negative by FISH.  [facility] core biopsy left axillary LN: +LN metastasis.      06/13/2017 -  Other     Seen in [facility] ED following core biopsy in [facility] for axillary pain with non-expanding 4 cm hematoma noted in left axilla. No intervention required other than symptomatic measures (compression, pain management).      06/20/2017 Tumor Board     [facility] recs: left breast cT2 N1a IDC, G1, receptors pending. Per outside imaging there was a single abnormal LN found by [place] only. Awaiting receptors. Need SAVI into that clipped node. Discussed that she otherwise fits ACOSOG Z0011 criteria with negative clinical exam prior to biopsy and single abnormal LN on axillary [place]. Reasonable to do SLNB and excise biopsied node as well. If receptors favorable, consider surgery first approach, good BCT candidate. If high risk (TN, HER+) may need NACT approach. Briefly discussed ALTERNATE, but consensus was that she will likely be recommended for chemo given age and nodal positivity. Meeting surgery, rad/onc today. Will circle back with med/onc as needed.      07/03/2017 Surgery     Left [facility] localized/US guided partial mastectomy and left SLNbx with ALNDx: Invasive mammary carcinoma with mixed ductal and lobular features present in association with in situ carcinoma with mixed ductal and lobular features measuring 8.3 cm with positive inferior and lateral margin. Sentinel lymph node biopsy: 1/1 node positive for metastatic disease measuring 7 mm. No ECE. Palpable axillary lymph nodes: 4/4 lymph nodes with metastatic disease measuring 10 mm.  No ECE.  Additional palpable axillary lymph node 0/1 with isolated tumor cells. SAVI scout in the axilla was defective and there was no  localization signal from the SAVI probe.      08/14/2017 Surgery     Left re-excision, ALND w ARM: Lateral margin: Invasive carcinoma with mixed ductal and lobular features measuring 7mm w LVI, margin negative.   Inferior margin: Multiple foci of lymphovasular invasion present.  No evidence of stromal invasion (final margin negative)  Left ALND: ??Two of twenty-three lymph nodes positive for carcinoma (2/23).  Size of larger tumor deposit: 6 mm.  Extracapsular extension: Absent      09/21/2017 -  Chemotherapy     Started ddTaxol--->ddAC      09/21/2017 Adverse Reaction     First Taxol infused for ~20 minutes resulting in flushing with chest tightness and nausea.  Infusion was stopped and administered.  Symptoms subsided within 20 minutes but she continued to feel some chest tightness with deep inspiration.  Albuterol nebulizer was given.  She then returned to baseline and infusion was restarted at 1/2 rate and titrated up slowly.  She was able to complete infusion without any further events.       10/05/2017 -  Chemotherapy     Switching to Abraxane due to infusion reaction      10/11/2017 Adverse Reaction     Acute DVT Rt interval jugular, brachiocephalic, subclavian vein-now on Eliquis 5 mg qd.       12/05/2017 -  Chemotherapy     Chemotherapy Treatment    Treatment Goal Curative   Line of Treatment [No plan line of treatment]   Plan Name OP BREAST AC (DOSE DENSE)   Start Date 12/07/2017   End Date 01/26/2018   Provider Willeen Niece, MD   Chemotherapy dexamethasone (DECADRON) tablet 12 mg, 12 mg, Oral, Once, 4 of 4 cycles  Administration: 12 mg (12/07/2017), 12 mg (12/21/2017), 12 mg (01/11/2018), 12 mg (01/25/2018)  cyclophosphamide (CYTOXAN) 1,254 mg in sodium chloride (NS) 0.9 % 250 mL IVPB, 600 mg/m2 = 1,254 mg, Intravenous, Once, 4 of 4 cycles  Administration: 1,254 mg (12/07/2017), 1,254 mg (12/21/2017), 1,254 mg (01/11/2018), 1,254 mg (01/25/2018)  DOXOrubicin (ADRIAMYCIN) syringe 125.4 mg, 60 mg/m2 = 125.4 mg, Intravenous, Once, 4 of 4 cycles  Administration: 125.4 mg (12/07/2017), 125.4 mg (12/21/2017), 125.4 mg (01/11/2018), 125.4 mg (01/25/2018)              OBJECTIVE:        Photos 12/24/17     Girth Measurements:      DYNAMIC GAIT INDEX    Grading: record the lowest category that applies.   1.Gait level surface: 3  Instructions: Walk at your normal speed from here to the next mark (20???).     (3) Normal: walks 20???, no assistive devices, good speed, no evidence for imbalance, normal gait pattern.   (2) Mild impairment: walks 20???, uses assistive devices, slower speed, mild gait deviations.   (1) Moderate impairment: walks 20???, slow speed, abnormal gait patters, evidence for imbalance.   (0) Severe impairment: cannot walk 20??? without assistance, severe gait deviations or imbalance.     2.Change in gait speed: 3  Instructions: Begin walking at your normal pace (for 5???), when I tell you ???go???, walk as fast as you can (for 5???). When I tell you ???slow???, walk as slowly as you can (for 5???).     (3) Normal: Able to smoothly change walking speed without loss of balance or gait deviation. Shows significant difference in walking speeds between normal, fast and slow paces.   (2) Mild impairment: Is able to change  speed but demonstrates mild gait deviations, or no gait deviations but unable to achieve a significant change in velocity, or uses as assistive device.   (1) Moderate impairment: Makes only minor adjustments to walking speed, or accomplishes a change in speed with significant gait deviations, or changes speed but loses balance but is able to recover and continue walking.   (0) Severe impairment: Cannot change speeds, or loss balance and has to reach for a wall or be caught.     3.Gait with horizontal head turns: 1  Instructions: Begin walking at your normal pace. When I tell you to ???look right???, keep walking straight, but turn your head to the right. Keep looking to the right until I tell you ???look left???, then keep walking straight and turn your head to the left. Keep your head to the left until I tell you, ???look straight???, then keep walking straight, but return your head to the centre.     (3) Normal: Performs head turns smoothly with no change in gait.   (2) Mild impairment: Performs head turns smoothly with slight change in gait velocity, i.e. minor disruption to smooth gait path or uses walking aid.   (1) Moderate impairment: Performs head turns with moderate change in gait velocity, slows down, staggers, but recovers, can continue to walk.   (0) Severe impairment: Performs task with severe disruption of gait, i.e. staggers outside 15??? path, loses balance, stops, reaches for wall.     4.Gait with vertical head turns:3  Instructions: Begin walking at your normal pace. When I tell you to ???look up???, keep walking straight, but tip your head and look up. Keep looking up until I tell you, ???look down???. Then keep walking straight and turn your head down. Keep looking down until I tell you, ??? look straight???, then keep walking straight, but return your head to the centre.     (3) Normal: Performs head turns smoothly with no change in gait.   (2) Mild impairment: Performs head turns smoothly with slight change in gait velocity, i.e. minor disruption to smooth gait path or uses walking aid.   (1) Moderate impairment: Performs head turns with moderate change in gait velocity, slows down, staggers, but recovers, can continue to walk.   (0) Severe impairment: Performs task with severe disruption of gait, i.e. staggers outside 15??? path, loses balance, stops, reaches for wall.     5.Gait and pivot turn: 1  Instructions: Begin walking at your normal pace. When I tell you, ???turn and stop???, turn as quickly as you can to face the opposite direction and stop.     (3) Normal: Pivot turns safely within 3 seconds and stops quickly with no loss of balance.   (2) Mild impairment: pivot turns safely in >3 seconds and stops with no loss of balance.   (1) Moderate impairment: Turns slowly, requires verbal cueing, requires several small steps to catch balance following turn and stop.   (0) Severe impairment: Cannot turn safely, requires assistance to turn and stop.     6.Step over obstacle: 2  Instructions: Begin walking at your normal speed. When you come to the shoebox, step over it, not around it, and keep walking.     (3) Normal: Is able to step over box without changing gait speed; no evidence for imbalance.   (2) Mild impairment: Is able to step over shoe box, but must slow down and adjust steps to clear box safely.   (1) Moderate impairment: Is able to  step over box but must stop, then step over. May require verbal cueing.   (0) Severe impairment: Cannot perform without assistance.     7.Step around obstacles: 3  Instructions: Begin walking at normal speed. When you come to the first cone (about 6??? away), walk around the right side of it. When you some to the second cone (6??? past first cone), walk around it to the left.     (3) Normal: Is able to walk safely around cones safely without changing gait speed; no evidence of imbalance.   (2) Mild impairment: Is able to step around both cones, but must slow down and adjust steps to clear cones.   (1) Moderate impairment: Is able to clear cones but must significantly slow speed to accomplish task, or requires verbal cueing.   (0) Severe impairment: Unable to clear cones, walks into one or both cones, or requires physical assistance.     8.Steps: 2  Instructions: Walk up these stairs as you would at home.(i.e. using a rail if necessary. At the top, turn around and walk down.     (3) Normal: Alternating feet, no rail.   (2) Mild impairment: Alternating feet, must use rail.   (1) Moderate impairment: Two feet to a stair, must use rail.   (0) Severe impairment: Cannot do safely.     TOTAL SCORE: 18/24  F:\Intranet\[facility] website\physiotherapy section\Dynamic Gait Index v.doc       Location of swelling:  L chest and axilla.     Skin: WFL except for the following:  increased skin thickness    Stemmer???s sign:no.      Sensation Intact to light touch:   Decreased sensation under axilla, around scar, and posterior lateral upper arm     Incision: healing appropriately for post op status, adhered to underlying tissue    Axillary Web Syndrome::yes     Posture/Observations:  Forward head, rounded shoulders        Range of Motion/Flexibilty:   UE WFL except  End range overhead activity.     Strength/MMT:   UE WFL     Total Treatment Time: 60 min        Treatment Rendered:      Manual therapy  X 55 min    - LUQ MLD anterior  And posterior anastamosis .    - lymphatouch RUQ       Today's Charges (noted here with $$):                          Equipment provided/recommended:   written HEP    Communication/consultation with other professionals:  Email to DME supplier [employer] re: garment needs        I attest that I have reviewed the above information.  Signed: Kirke Corin, PT   02/28/2018 9:34 AM

## 2018-03-01 ENCOUNTER — Ambulatory Visit: Admit: 2018-03-01 | Discharge: 2018-03-02

## 2018-03-04 ENCOUNTER — Ambulatory Visit: Admit: 2018-03-04 | Discharge: 2018-03-05

## 2018-03-04 DIAGNOSIS — Z170 Estrogen receptor positive status [ER+]: Secondary | ICD-10-CM

## 2018-03-04 DIAGNOSIS — C50412 Malignant neoplasm of upper-outer quadrant of left female breast: Principal | ICD-10-CM

## 2018-03-04 NOTE — Unmapped (Signed)
03/04/2018    Dose Site Summary:  UJ:WJXBJYN: 03/01/2018: 1,000/5,000 cGy  WG:NFAOZHYQ: 03/01/2018: 1,000/5,000 cGy    Blood pressure 119/72, pulse 97, temperature 36.2 ??C (97.2 ??F), temperature source Temporal, resp. rate 18, weight 82.1 kg (180 lb 14.4 oz), SpO2 98 %.    Wt Readings from Last 6 Encounters:   03/04/18 82.1 kg (180 lb 14.4 oz)   02/27/18 82.6 kg (182 lb 3.2 oz)   02/25/18 81.8 kg (180 lb 6.4 oz)   02/08/18 82.9 kg (182 lb 11.2 oz)   01/26/18 87.9 kg (193 lb 12.6 oz)   01/25/18 86.5 kg (190 lb 11.2 oz)       Subjective/Assessment/Recommendations:    1. Skin: Intact, Erythematous and Continue emollients  2. Lymphedema: Not present  3. Nutrition: No issues and Eating well  4. Fatigue: Mild  5. Pain: Denies pain  6. Elimination: No issues  7. Prescription Needs: None  8. Psychosocial: Has home support and Accompanied by family member/other today

## 2018-03-04 NOTE — Unmapped (Signed)
Radiation Treatment Management Note    Visit Date: 03/04/2018   Patient Name: Erika Cabrera   Date of Birth: 1956-07-22   MRN: [id number]  Age: 62 y.o.    Identifying Information and Diagnosis:  Breast cancer    Treatment Plan: Luvinia L Dosh??is a??62 y.o.??female??left breast pT3pN2a, IDC, G2, ER+(95%)/PR+(30%)/HER2-neg,??5/6 LN positive, +LVI and positive surgical margins s/p BCT with targeted axillary LN bx on 07/03/17. Unfortunately her pathology revealed residual disease and she requires re-excision with ALND. Completed this on 08/14/17 showing 7 mm of residual tumor, negative margins, 2/23 +LN with larger tumor deposit of 6 mm. She is now s/p chemotherapy. Refer to consult note for full clinical details.    Summary of Radiotherapy  Left breast and L [place] fossa  5/25 fractions  1000cGy/ 5000cGy      Subjective and Interval History  Second week. Minor left breast erythma. Skin care reviewed. Recommended starting Aquaphor BID, after treatment and before going to bed.      PHYSICAL EXAMINATION:  BP 119/72  - Pulse 97  - Temp 36.2 ??C (97.2 ??F) (Temporal)  - Resp 18  - Wt 82.1 kg (180 lb 14.4 oz)  - SpO2 98%  - BMI 28.06 kg/m??      GENERAL:  Reveals a well-developed, well-nourished, post chemo and surgery.  SKIN:  Mild erythema of the left breast, no desquamation or drainage    Winferd Humphrey, MD, PhD  Radiation Oncology PGY-2  Pager: [phone]  03/04/2018  3:50 PM       I saw and evaluated and examined the patient, participating in the key portions of the service. I discussed the findings, assessment, and plan of care with the provider and patient. I agree with the findings and plan as documented in the note.     Trena Platt MD MS MPH  Assistant Professor  Department of Radiation Oncology  [place] [facility] [place] at [facility]

## 2018-03-05 ENCOUNTER — Ambulatory Visit: Admit: 2018-03-05 | Discharge: 2018-03-06

## 2018-03-06 ENCOUNTER — Ambulatory Visit: Admit: 2018-03-06 | Discharge: 2018-03-07

## 2018-03-07 ENCOUNTER — Encounter: Admit: 2018-03-07 | Discharge: 2018-04-06 | Payer: PRIVATE HEALTH INSURANCE

## 2018-03-07 ENCOUNTER — Ambulatory Visit
Admit: 2018-03-07 | Discharge: 2018-04-06 | Payer: PRIVATE HEALTH INSURANCE | Attending: Internal Medicine | Primary: Internal Medicine

## 2018-03-07 ENCOUNTER — Ambulatory Visit: Admit: 2018-03-07 | Discharge: 2018-03-08

## 2018-03-07 ENCOUNTER — Encounter
Admit: 2018-03-07 | Discharge: 2018-04-06 | Payer: PRIVATE HEALTH INSURANCE | Attending: Radiation Oncology | Primary: Radiation Oncology

## 2018-03-07 ENCOUNTER — Encounter
Admit: 2018-03-07 | Discharge: 2018-04-06 | Payer: PRIVATE HEALTH INSURANCE | Attending: Internal Medicine | Primary: Internal Medicine

## 2018-03-07 DIAGNOSIS — Z510 Encounter for antineoplastic radiation therapy: Principal | ICD-10-CM

## 2018-03-08 ENCOUNTER — Ambulatory Visit: Admit: 2018-03-08 | Discharge: 2018-03-09

## 2018-03-08 DIAGNOSIS — C50412 Malignant neoplasm of upper-outer quadrant of left female breast: Principal | ICD-10-CM

## 2018-03-08 DIAGNOSIS — Z170 Estrogen receptor positive status [ER+]: Secondary | ICD-10-CM

## 2018-03-08 NOTE — Unmapped (Signed)
03/08/2018    Dose Site Summary:  Rx:LT SCV: 03/08/2018: 4,000/5,000 cGy  Rx:LT CHEST: 03/08/2018: 4,000/5,000 cGy      Subjective/Assessment/Recommendations:    1. Skin: No problems  2. Lymphedema: Not present  3. Nutrition: No issues, Eating well and appetite decreased  4. Fatigue: No issues  5. Pain: Denies pain  6. Elimination: No issues  7. Prescription Needs: None  8. Psychosocial: No issues

## 2018-03-08 NOTE — Unmapped (Signed)
Radiation Treatment Management Note    Visit Date: 03/08/2018   Patient Name: Erika Cabrera   Date of Birth: Jun 20, 1956   MRN: [id number]  Age: 62 y.o.    Identifying Information and Diagnosis:  Breast cancer    Treatment Plan: Doyne L Flemings??is a??62 y.o.??female??left breast pT3pN2a, IDC, G2, ER+(95%)/PR+(30%)/HER2-neg,??5/6 LN positive, +LVI and positive surgical margins s/p BCT with targeted axillary LN bx on 07/03/17. Unfortunately her pathology revealed residual disease and she requires re-excision with ALND. Completed this on 08/14/17 showing 7 mm of residual tumor, negative margins, 2/23 +LN with larger tumor deposit of 6 mm. She is now s/p chemotherapy.     Assessment: Continue treatment as planned    Summary of Radiotherapy:  Left breast and L [place] fossa  5/25 fractions  2000cGy/ 5000cGy    Subjective and Interval History:  Tolerating treatment well. Mild erythema. Using cream daily. Asking insightful questions about radiation dose and follow up scans.    PHYSICAL EXAMINATION:  BP 122/69  - Pulse 99  - Temp 36.5 ??C (97.7 ??F)  - Resp 18      GENERAL:  Reveals a well-developed, well-nourished, post chemo and surgery.  SKIN:  Mild erythema of the left breast, no desquamation or drainage  _____________________  Fuller Song, MD, PGY-3  [phone]  Radiation Oncology  [facility]    I saw and evaluated and examined the patient, participating in the key portions of the service. I discussed the findings, assessment, and plan of care with the provider and patient. I agree with the findings and plan as documented in the note.     Trena Platt MD MS MPH  Assistant Professor  Department of Radiation Oncology  [place] [facility] [place] at [facility]

## 2018-03-11 ENCOUNTER — Ambulatory Visit: Admit: 2018-03-11 | Discharge: 2018-03-12

## 2018-03-12 ENCOUNTER — Ambulatory Visit: Admit: 2018-03-12 | Discharge: 2018-03-13

## 2018-03-12 ENCOUNTER — Encounter
Admit: 2018-03-12 | Discharge: 2018-03-13 | Payer: PRIVATE HEALTH INSURANCE | Attending: Medical Oncology | Primary: Medical Oncology

## 2018-03-12 DIAGNOSIS — E2839 Other primary ovarian failure: Principal | ICD-10-CM

## 2018-03-12 DIAGNOSIS — Z170 Estrogen receptor positive status [ER+]: Secondary | ICD-10-CM

## 2018-03-12 DIAGNOSIS — C50912 Malignant neoplasm of unspecified site of left female breast: Secondary | ICD-10-CM

## 2018-03-12 MED ORDER — LETROZOLE 2.5 MG TABLET
ORAL_TABLET | Freq: Every day | ORAL | 11 refills | 0.00000 days | Status: CP
Start: 2018-03-12 — End: 2019-01-12

## 2018-03-12 NOTE — Unmapped (Signed)
Breast Cancer Return Patient Evaluation:     Referring Physician: Cala Bradford, Md  [address]  [facility] [place], [place] [address].     PCP: Kurtis Bushman, MD    Assessment/Plan:  I spent at least 40 minutes with this patient more than 50% in counseling. The following issues were discussed:    A 62yo woman with a Left breast pT3pN2a, IDC, G2, ER+(95%)/PR+(30%)/HER2-neg, 5/6 LN positive, +LVI and positive surgical margins s/p BCT with targeted axillary LN bx on 07/03/17. Unfortunately her pathology revealed residual disease and she requires re-excision with ALND. Completed this on 08/14/17 showing 7 mm of residual tumor, negative margins, 2/23 +LN with larger tumor deposit of 6 mm.    Planned for ddAC-T but H/O neuropathy; making weekly paclitaxel challenging so tried q 2 week taxol. In her, we started with her taxane because of wound healing and inability to get a port. However, she had a hypesensitivity reaction  into Taxol infusion and was able to complete at a slower rate.  Accordingly, switched to q3w Abraxane. Then 4 AC  Then XRT    I spent at least 40 minutes with this patient more than 50% in counseling. The following issues were discussed:     - Plan:   When she finishes her XRT she will start letrozole.  Counselling given today  - Antiestrogen therapy (AI) for 5-32yrs  -The risks and benefits of aromatase inhibitors (anastrozole, letrozole, and exemestane) were discussed in detail and the patient was informed of the following: Risks include the development of painful muscles and joints (arthralgia/myalgia) and bone loss. Muscle and joint pain can be severe but rarely result in any tissue damage; symptoms usually resolve in several weeks when the medication is stopped. Bone loss is common and a bone density test is recommended as a baseline and then yearly to every several years depending on initial results. The risk of fractures is increased by a few percent in patients taking these drugs, but careful monitoring of bone density and using bone protecting agents when indicated can minimize these risks. Unlike tamoxifen there is no increased risk of blood clots or endometrial cancer. AIs can cause or worsen vaginal dryness but women using these drugs should not use vaginal estrogen preparations for these symptoms. AIs can also cause or increase hot flashes. Any other symptoms should be reported.    We also talked about therapy for vaginal dryness and pain. Husband also undergoing therapy for prostate cancer I think    RTC   months    Reason for Visit: A 62 y.o. female with breast cancer   Receiving adjuvant chemotherapy for her pT3pN2a HR+ Her2 negative left sided breast cancer.    10/11/17 She initially presented with self-palpated left breast mass and workup/surgery revealed an 8.3cm tumor HR+/HER2-neg with positive LNs. She reported baseline neuropathy in her feet and is followed by a Neuroloogist in [place].  She does not have diabetes and states that the neuropathy is due to structural issues with her feet and high arches and runs in her family. She has no history of heart issues, never had an MI or heart failure. She recently moved to [facility] from [place].  She was laid off from her job and was working part-time but could not qualify for [employer] on the open market so she had to quit her part-time job.      Interval History:  She has a new primary care  physician who has started her on Cymbalta for her neuropathy and is decreasing the fluoxetine.  She is had a recent bone density in [place] at [facility] and we will try to get that.  The saddle and rectal numbness she described at her last visit she thinks correlate with constipation INR resolved.  The urinary incontinence she was experiences are better now that she is off of the Ativan she is also having physical therapy for pelvic floor.  She is having worsening neuropathy this predates her chemotherapy and she thinks is related to her foot architecture with apparently an inherited condition.  She is also been diagnosed with prediabetes since her last visit.  She would like to participate and get real and heal and we will refer her  She has filled out the [facility] [id number] form which I have reviewed with her and filed in med rec . Comprehensive 12 point ROS is performed and is negative except as noted above.       Breast Cancer History:   Oncology History    2018: Left breast cT2 N1a, IDC, G1, +/+/-.        Malignant neoplasm of upper-outer quadrant of left breast in female, estrogen receptor positive (CMS-HCC)    06/07/2017 -  Presenting Symptoms     Left breast palpable UOQ mass      06/07/2017 Interval Scan(s)     Diagnostic [facility] at [facility]: left breast UOQ mass difficult to measure, but appears to measure at least 5.6 cm.    [place]: left breast ill defined shadowing lesion at 2:00, 6 CFN measuring 3.7 x 2.6 x 2.5 cm.     Single mildly abnormal node in left axilla with constriction of fatty hilum.      06/13/2017 Biopsy     [facility] core biopsy of left breast 2:00, 6 CFN: IDC, G1, ER+(95%), PR+(30%), HER2 negative by FISH.  [facility] core biopsy left axillary LN: +LN metastasis.      06/13/2017 -  Other     Seen in [facility] ED following core biopsy in [facility] for axillary pain with non-expanding 4 cm hematoma noted in left axilla. No intervention required other than symptomatic measures (compression, pain management).      06/20/2017 Tumor Board     [facility] recs: left breast cT2 N1a IDC, G1, receptors pending. Per outside imaging there was a single abnormal LN found by [place] only. Awaiting receptors. Need SAVI into that clipped node. Discussed that she otherwise fits ACOSOG Z0011 criteria with negative clinical exam prior to biopsy and single abnormal LN on axillary [place]. Reasonable to do SLNB and excise biopsied node as well. If receptors favorable, consider surgery first approach, good BCT candidate. If high risk (TN, HER+) may need NACT approach. Briefly discussed ALTERNATE, but consensus was that she will likely be recommended for chemo given age and nodal positivity. Meeting surgery, rad/onc today. Will circle back with med/onc as needed.      07/03/2017 Surgery     Left [facility] localized/US guided partial mastectomy and left SLNbx with ALNDx: Invasive mammary carcinoma with mixed ductal and lobular features present in association with in situ carcinoma with mixed ductal and lobular features measuring 8.3 cm with positive inferior and lateral margin. Sentinel lymph node biopsy: 1/1 node positive for metastatic disease measuring 7 mm. No ECE. Palpable axillary lymph nodes: 4/4 lymph nodes with metastatic disease measuring 10 mm.  No ECE.  Additional palpable axillary lymph node 0/1 with isolated tumor cells.  SAVI scout in the axilla was defective and there was no localization signal from the SAVI probe.      08/14/2017 Surgery     Left re-excision, ALND w ARM: Lateral margin: Invasive carcinoma with mixed ductal and lobular features measuring 7mm w LVI, margin negative.   Inferior margin: Multiple foci of lymphovasular invasion present.  No evidence of stromal invasion (final margin negative)  Left ALND: ??Two of twenty-three lymph nodes positive for carcinoma (2/23).  Size of larger tumor deposit: 6 mm.  Extracapsular extension: Absent      09/21/2017 -  Chemotherapy     Started ddTaxol--->ddAC      09/21/2017 Adverse Reaction     First Taxol infused for ~20 minutes resulting in flushing with chest tightness and nausea.  Infusion was stopped and administered.  Symptoms subsided within 20 minutes but she continued to feel some chest tightness with deep inspiration.  Albuterol nebulizer was given.  She then returned to baseline and infusion was restarted at 1/2 rate and titrated up slowly.  She was able to complete infusion without any further events.       10/05/2017 -  Chemotherapy     Switching to Abraxane due to infusion reaction      10/11/2017 Adverse Reaction     Acute DVT Rt interval jugular, brachiocephalic, subclavian vein-now on Eliquis 5 mg qd.       12/05/2017 -  Chemotherapy     Chemotherapy Treatment    Treatment Goal Curative   Line of Treatment [No plan line of treatment]   Plan Name OP BREAST AC (DOSE DENSE)   Start Date 12/07/2017   End Date 01/26/2018   Provider Willeen Niece, MD   Chemotherapy dexamethasone (DECADRON) tablet 12 mg, 12 mg, Oral, Once, 4 of 4 cycles  Administration: 12 mg (12/07/2017), 12 mg (12/21/2017), 12 mg (01/11/2018), 12 mg (01/25/2018)  cyclophosphamide (CYTOXAN) 1,254 mg in sodium chloride (NS) 0.9 % 250 mL IVPB, 600 mg/m2 = 1,254 mg, Intravenous, Once, 4 of 4 cycles  Administration: 1,254 mg (12/07/2017), 1,254 mg (12/21/2017), 1,254 mg (01/11/2018), 1,254 mg (01/25/2018)  DOXOrubicin (ADRIAMYCIN) syringe 125.4 mg, 60 mg/m2 = 125.4 mg, Intravenous, Once, 4 of 4 cycles  Administration: 125.4 mg (12/07/2017), 125.4 mg (12/21/2017), 125.4 mg (01/11/2018), 125.4 mg (01/25/2018)                Past Medical History:  Patient Active Problem List   Diagnosis   ??? Malignant neoplasm of upper-outer quadrant of left breast in female, estrogen receptor positive (CMS-HCC)   ??? Depression   ??? Peripheral polyneuropathy       Gyn History:   LMP 2012    Surgical History:   Past Surgical History:   Procedure Laterality Date   ??? ANAL FISSURECTOMY     ??? BREAST BIOPSY Left 06/2017    malignant   ??? BREAST BIOPSY Left 06/2017    Lympnode bx-positive   ??? BREAST LUMPECTOMY Left 06/2017   ??? CHEMOTHERAPY     ??? IR INSERT PORT AGE GREATER THAN 5 YRS  09/28/2017    IR INSERT PORT AGE GREATER THAN 5 YRS 09/28/2017 Rush Barer, MD IMG VIR [facility]   ??? PR BX/REMV,LYMPH NODE,DEEP AXILL Left 07/03/2017    Procedure: BX/EXC LYMPH NODE; OPEN, DEEP AXILRY NODE;  Surgeon: Talbert Cage, DO;  [facility]: [facility];  Service: Surgical Oncology   ??? PR INTRAOPERATIVE SENTINEL LYMPH NODE ID W DYE INJECTION Left 07/03/2017 Procedure: INTRAOPERATIVE IDENTIFICATION  SENTINEL LYMPH NODE(S) INCLUDE INJECTION NON-RADIOACTIVE DYE, WHEN PERFORMED;  Surgeon: Talbert Cage, DO;  Location: [facility];  Service: Surgical Oncology   ??? PR INTRAOPERATIVE SENTINEL LYMPH NODE ID W DYE INJECTION Left 08/14/2017    Procedure: INTRAOPERATIVE IDENTIFICATION SENTINEL LYMPH NODE(S) INCLUDE INJECTION NON-RADIOACTIVE DYE, WHEN PERFORMED;  Surgeon: Talbert Cage, DO;  Location: ASC OR [facility];  Service: Surgical Oncology Breast   ??? PR MASTECTOMY, PARTIAL Left 07/03/2017    Procedure: MASTECTOMY, savi scout PARTIAL (EG, LUMPECTOMY, TYLECTOMY, QUADRANTECTOMY, SEGMENTECTOMY);  Surgeon: Talbert Cage, DO;  Location: ASC OR [facility];  Service: Surgical Oncology   ??? PR MASTECTOMY, PARTIAL Left 08/14/2017    Procedure: MASTECTOMY, PARTIAL (EG, LUMPECTOMY, TYLECTOMY, QUADRANTECTOMY, SEGMENTECTOMY) Re-excision;  Surgeon: Talbert Cage, DO;  Location: ASC OR [facility];  Service: Surgical Oncology Breast   ??? PR REMOVE ARMPITS LYMPH NODES COMPLT Left 08/14/2017    Procedure: AXILLARY LYMPHADENECTOMY; COMPLETE;  Surgeon: Talbert Cage, DO;  [facility]: [facility];  Service: Surgical Oncology Breast       Medications:  Current Outpatient Medications   Medication Sig Dispense Refill   ??? acetaminophen (TYLENOL) 500 MG tablet Take 1,000 mg by mouth every four (4) hours as needed for pain.     ??? apixaban (ELIQUIS) 5 mg Tab Take 1 tablet (5 mg total) by mouth Two (2) times a day. 60 tablet 5   ??? buPROPion (WELLBUTRIN XL) 300 MG 24 hr tablet Take 300 mg by mouth daily.     ??? docusate sodium (COLACE) 100 MG capsule Take 100 mg by mouth daily with breakfast.     ??? DULoxetine (CYMBALTA) 20 MG capsule Take 1 capsule (20 mg total) by mouth daily. 30 capsule 0   ??? GABAPENTIN ORAL Take 900 mg by mouth nightly. Pt taking 1 capsule at noon and 3 capsules at 8PM     ??? polyethylene glycol (MIRALAX) 17 gram packet Take 17 g by mouth daily as needed.  0   ??? ranitidine (ZANTAC) 150 MG tablet Take 150 mg by mouth daily.        No current facility-administered medications for this visit.        Allergies:  Allergies   Allergen Reactions   ??? Erythromycin Diarrhea   ??? Penicillins Rash       Personal and Social History:  Married . Accompanied to visit by her husband.     Family History:  Cancer-related family history includes Breast cancer in her paternal aunt. There is no history of Cancer or Ovarian cancer.  She indicated that the status of her mother is unknown. She indicated that the status of her father is unknown. She indicated that the status of her sister is unknown. She indicated that the status of her maternal grandmother is unknown. She indicated that the status of her maternal grandfather is unknown. She indicated that the status of her paternal grandmother is unknown. She indicated that the status of her paternal grandfather is unknown. She indicated that the status of her daughter is unknown. She indicated that the status of her neg hx is unknown.      Review of Systems: A complete review of systems was obtained including: Constitutional, Eyes, ENT, Cardiovascular, Respiratory, GI, GU, Musculoskeletal, Skin, Neurological, Psychiatric, Endocrine, Heme/Lymphatic, and Allergic/Immunologic systems. It is negative or non-contributory to the patient???s management except for the following: None    Physical Examination:  GENERAL: Well appearing woman in no acute distress.  VITAL SIGNS:   Vitals:  03/12/18 0949   BP: 110/74   Pulse: 80   Resp: 18   Temp: 36.7 ??C (98.1 ??F)   SpO2: 100%     HEENT: Normocephalic, symmetric. EOMI. Sclera clear.  NECK: supple and non tender  LYMPH: no cervical, supraclavicular or axillary lymphadenopathy.  CHEST/BREASTS: Well healed incision, no evidence of locoregional recurrence. Remainder of the bilateral breasts without mass, skin change, retraction or nipple discharge.  No lymphedema noted.  LUNGS: respirations even and unlabored.clear to ausculation bilaterally.  CARDIAC: RRR, no murmurs.   ABDOMEN: Soft, nontender, no hepatomegaly or masses  SKIN: No rash, ecchymoses, purpuric lesions noted.  EXTREMITIES: No upper or lower extremity edema, normal skin tone.  MSK: Spine is nontender.  NEURO: Alert, oriented, normal gait &coordination.

## 2018-03-12 NOTE — Unmapped (Signed)
It was a pleasure to see you today.  For appointments call [phone]  For new symptoms or health related issues, please call   Nurse Navigator: Yehuda Savannah RN [phone]  On weekends and after hours on weekdays, please call [phone] for the oncology fellow on call.      If you use my[facility]chart, please know that I check messages only twice a week. If your message is urgent, please call or page your nurse navigator or the on-call fellow.     Here are some ideas for the vaginal dryness.  Luvena and Replens

## 2018-03-13 ENCOUNTER — Ambulatory Visit: Admit: 2018-03-13 | Discharge: 2018-03-14

## 2018-03-14 ENCOUNTER — Ambulatory Visit: Admit: 2018-03-14 | Discharge: 2018-03-15

## 2018-03-14 DIAGNOSIS — M5126 Other intervertebral disc displacement, lumbar region: Secondary | ICD-10-CM

## 2018-03-14 DIAGNOSIS — I890 Lymphedema, not elsewhere classified: Secondary | ICD-10-CM

## 2018-03-14 DIAGNOSIS — Z170 Estrogen receptor positive status [ER+]: Secondary | ICD-10-CM

## 2018-03-14 DIAGNOSIS — C50412 Malignant neoplasm of upper-outer quadrant of left female breast: Principal | ICD-10-CM

## 2018-03-14 NOTE — Unmapped (Signed)
OUTPATIENT PHYSICALTHERAPY  Upper Quadrant Lymphedema  Daily note   Date:03/14/2018    Patient Name: Erika Cabrera  Date of Birth:04/14/1956  Session #:12  Diagnosis:   Encounter Diagnoses   Name Primary?   ??? Malignant neoplasm of upper-outer quadrant of left breast in female, estrogen receptor positive (CMS-HCC) Yes   ??? Lymphedema    ??? Bulging lumbar disc        Referring Provider: Ophelia Shoulder*    Initial Evaluation  : 10/25/17     New POC 7/9-10/1/19  Contraindications:  none    Precautions/Red Flags:  history of cancer      ASSESSMENT/Reason for Referral:    62 y.o. female presents with Stage  II  lymphedema secondary in chest and under axilla.  Cont to tolerate MLD well. Will cont to see patient x 1 per week while undergoing radiation. Radiation does appear to make arm start to tighten up again with cording symptoms- will cont to work on minimizing this.  Pt currently not scheduled for this, but will recommend a 6 week post radiation follow up with either Bayard Males, DPT CLT or Val Collins to ensure no lingering physical deficits s/p tx.  Plan to take volume measurements again next week.     Problem List:   Lack of knowledge of lymphedema and self care of this condition, Increased risk of infection, Lack of appropriate compression garment and Uncontrolled lymphedema swelling    Secondary Functional Limitations:  Decreased  ROM of arm and Scar/Myofascial  immobility    Barriers to learning:  no      Patient Goals: Decrease swelling, obtain a compression garment and decrease stiffness    Physical Therapy Goals:   In 3-4 sessions    Pt will have knowledge of proper skin care and lymphedema risk precautions to reduce risk of infection.   Met 01/17/18  Patient will be able to properly demonstrate self-manual lymphatic drainage x1 in clinic to assist in management of lymphedema swelling progressing- needs review 01/17/18   Pt. will independently don/doff and tolerate compression garment or compression bandages x1 in clinic  to ensure independent management of lymphedema.  Progressing - will wait until after radiation to assess need of compression bra.   Patient will demonstrate proper posture  to minimize pain and tightness in the affected quadrant.   MET 02/12/18  MLD will be performed clinically to improve lymphangiomotoricity, thereby rerouting lymphatic flow to accomodate cancer treatment effects on the lymphatic system. Met 01/17/18    In 1-6 months:    Pt will be independent with all self care related to lymphedema.  Progressing 01/17/18       Prognosis for goal achievement:   Good due to good motivation.      PLAN:    mechanics, taping, and pump/ equipment  recommendations     Planned frequency and duration  of treatment:   1x week for 6 weeks during radiation  Then follow up check 6 weeks post radiation.        SUBJECTIVE:  Patient???s communication preference: verbal, written, visual, prn    Pt reports radiation will be over Aug 30th. Reports going well so far.  Denies breakage of skin. Reports some pinkish discoloration of skin with radiation.      0/10  Pain.      Onset of swelling:  Since drains removed.      History of Present Illness/ Pt reports:   Pt reports recurrent Seroma since drains removed .  Has been drained multiple times 1s time 360 ml, second time 310,  35 mil, 3rd time 55 ml, 4th time 48 ml.  Pt reports has not noticed swelling in extremity.  Some ROM  Deficits/ tightness noted.     Home environment:       Lives with family. Has two kids in college   Sleeping position: bed     Pt has caregiver available, capable and willing to help:yes     Occupation/ Activities/Recreation:        Occupational History   ??? Not on file       Prior Functional Status:   Independent    Current Functional Limitations: The patient reports the following limitations based on verbal description and a Quick DASH score of  which indicates 6% impairment of the upper extremity:  arm, shoulder, and hand pain    Sexual Dysfunction or Bowel or Bladder changes : none    History of infections: no    Previous Lymphedema Treatment Type: none    Current management: n/a       Medical hx/conditions/surgical procedures, medications, allergies:    Reviewed      Recent Procedures/Tests/Findings:  Oncology History    2018: Left breast cT2 N1a, IDC, G1, +/+/-.        Malignant neoplasm of upper-outer quadrant of left breast in female, estrogen receptor positive (CMS-HCC)    06/07/2017 -  Presenting Symptoms     Left breast palpable UOQ mass      06/07/2017 Interval Scan(s)     Diagnostic [facility] at [facility]: left breast UOQ mass difficult to measure, but appears to measure at least 5.6 cm.    [place]: left breast ill defined shadowing lesion at 2:00, 6 CFN measuring 3.7 x 2.6 x 2.5 cm.     Single mildly abnormal node in left axilla with constriction of fatty hilum.      06/13/2017 Biopsy     [facility] core biopsy of left breast 2:00, 6 CFN: IDC, G1, ER+(95%), PR+(30%), HER2 negative by FISH.  [facility] core biopsy left axillary LN: +LN metastasis.      06/13/2017 -  Other     Seen in [facility] ED following core biopsy in [facility] [place] for axillary pain with non-expanding 4 cm hematoma noted in left axilla. No intervention required other than symptomatic measures (compression, pain management).      06/20/2017 Tumor Board     [facility] recs: left breast cT2 N1a IDC, G1, receptors pending. Per outside imaging there was a single abnormal LN found by [place] only. Awaiting receptors. Need SAVI into that clipped node. Discussed that she otherwise fits ACOSOG Z0011 criteria with negative clinical exam prior to biopsy and single abnormal LN on axillary [place]. Reasonable to do SLNB and excise biopsied node as well. If receptors favorable, consider surgery first approach, good BCT candidate. If high risk (TN, HER+) may need NACT approach. Briefly discussed ALTERNATE, but consensus was that she will likely be recommended for chemo given age and nodal positivity. Meeting surgery, rad/onc today. Will circle back with med/onc as needed.      07/03/2017 Surgery     Left [facility] localized/US guided partial mastectomy and left SLNbx with ALNDx: Invasive mammary carcinoma with mixed ductal and lobular features present in association with in situ carcinoma with mixed ductal and lobular features measuring 8.3 cm with positive inferior and lateral margin. Sentinel lymph node biopsy: 1/1 node positive for metastatic disease measuring 7 mm. No ECE. Palpable axillary  lymph nodes: 4/4 lymph nodes with metastatic disease measuring 10 mm.  No ECE.  Additional palpable axillary lymph node 0/1 with isolated tumor cells. SAVI scout in the axilla was defective and there was no localization signal from the SAVI probe.      08/14/2017 Surgery     Left re-excision, ALND w ARM: Lateral margin: Invasive carcinoma with mixed ductal and lobular features measuring 7mm w LVI, margin negative.   Inferior margin: Multiple foci of lymphovasular invasion present.  No evidence of stromal invasion (final margin negative)  Left ALND: ??Two of twenty-three lymph nodes positive for carcinoma (2/23).  Size of larger tumor deposit: 6 mm.  Extracapsular extension: Absent      09/21/2017 -  Chemotherapy     Started ddTaxol--->ddAC      09/21/2017 Adverse Reaction     First Taxol infused for ~20 minutes resulting in flushing with chest tightness and nausea.  Infusion was stopped and administered.  Symptoms subsided within 20 minutes but she continued to feel some chest tightness with deep inspiration.  Albuterol nebulizer was given.  She then returned to baseline and infusion was restarted at 1/2 rate and titrated up slowly.  She was able to complete infusion without any further events.       10/05/2017 -  Chemotherapy     Switching to Abraxane due to infusion reaction      10/11/2017 Adverse Reaction     Acute DVT Rt interval jugular, brachiocephalic, subclavian vein-now on Eliquis 5 mg qd.       12/05/2017 -  Chemotherapy Chemotherapy Treatment    Treatment Goal Curative   Line of Treatment [No plan line of treatment]   Plan Name OP BREAST AC (DOSE DENSE)   Start Date 12/07/2017   End Date 01/26/2018   Provider Willeen Niece, MD   Chemotherapy dexamethasone (DECADRON) tablet 12 mg, 12 mg, Oral, Once, 4 of 4 cycles  Administration: 12 mg (12/07/2017), 12 mg (12/21/2017), 12 mg (01/11/2018), 12 mg (01/25/2018)  cyclophosphamide (CYTOXAN) 1,254 mg in sodium chloride (NS) 0.9 % 250 mL IVPB, 600 mg/m2 = 1,254 mg, Intravenous, Once, 4 of 4 cycles  Administration: 1,254 mg (12/07/2017), 1,254 mg (12/21/2017), 1,254 mg (01/11/2018), 1,254 mg (01/25/2018)  DOXOrubicin (ADRIAMYCIN) syringe 125.4 mg, 60 mg/m2 = 125.4 mg, Intravenous, Once, 4 of 4 cycles  Administration: 125.4 mg (12/07/2017), 125.4 mg (12/21/2017), 125.4 mg (01/11/2018), 125.4 mg (01/25/2018)              OBJECTIVE:        Photos 12/24/17     Girth Measurements:      DYNAMIC GAIT INDEX    Grading: record the lowest category that applies.   1.Gait level surface: 3  Instructions: Walk at your normal speed from here to the next mark (20???).     (3) Normal: walks 20???, no assistive devices, good speed, no evidence for imbalance, normal gait pattern.   (2) Mild impairment: walks 20???, uses assistive devices, slower speed, mild gait deviations.   (1) Moderate impairment: walks 20???, slow speed, abnormal gait patters, evidence for imbalance.   (0) Severe impairment: cannot walk 20??? without assistance, severe gait deviations or imbalance.     2.Change in gait speed: 3  Instructions: Begin walking at your normal pace (for 5???), when I tell you ???go???, walk as fast as you can (for 5???). When I tell you ???slow???, walk as slowly as you can (for 5???).     (3) Normal: Able to  smoothly change walking speed without loss of balance or gait deviation. Shows significant difference in walking speeds between normal, fast and slow paces.   (2) Mild impairment: Is able to change speed but demonstrates mild gait deviations, or no gait deviations but unable to achieve a significant change in velocity, or uses as assistive device.   (1) Moderate impairment: Makes only minor adjustments to walking speed, or accomplishes a change in speed with significant gait deviations, or changes speed but loses balance but is able to recover and continue walking.   (0) Severe impairment: Cannot change speeds, or loss balance and has to reach for a wall or be caught.     3.Gait with horizontal head turns: 1  Instructions: Begin walking at your normal pace. When I tell you to ???look right???, keep walking straight, but turn your head to the right. Keep looking to the right until I tell you ???look left???, then keep walking straight and turn your head to the left. Keep your head to the left until I tell you, ???look straight???, then keep walking straight, but return your head to the centre.     (3) Normal: Performs head turns smoothly with no change in gait.   (2) Mild impairment: Performs head turns smoothly with slight change in gait velocity, i.e. minor disruption to smooth gait path or uses walking aid.   (1) Moderate impairment: Performs head turns with moderate change in gait velocity, slows down, staggers, but recovers, can continue to walk.   (0) Severe impairment: Performs task with severe disruption of gait, i.e. staggers outside 15??? path, loses balance, stops, reaches for wall.     4.Gait with vertical head turns:3  Instructions: Begin walking at your normal pace. When I tell you to ???look up???, keep walking straight, but tip your head and look up. Keep looking up until I tell you, ???look down???. Then keep walking straight and turn your head down. Keep looking down until I tell you, ??? look straight???, then keep walking straight, but return your head to the centre.     (3) Normal: Performs head turns smoothly with no change in gait.   (2) Mild impairment: Performs head turns smoothly with slight change in gait velocity, i.e. minor disruption to smooth gait path or uses walking aid.   (1) Moderate impairment: Performs head turns with moderate change in gait velocity, slows down, staggers, but recovers, can continue to walk.   (0) Severe impairment: Performs task with severe disruption of gait, i.e. staggers outside 15??? path, loses balance, stops, reaches for wall.     5.Gait and pivot turn: 1  Instructions: Begin walking at your normal pace. When I tell you, ???turn and stop???, turn as quickly as you can to face the opposite direction and stop.     (3) Normal: Pivot turns safely within 3 seconds and stops quickly with no loss of balance.   (2) Mild impairment: pivot turns safely in >3 seconds and stops with no loss of balance.   (1) Moderate impairment: Turns slowly, requires verbal cueing, requires several small steps to catch balance following turn and stop.   (0) Severe impairment: Cannot turn safely, requires assistance to turn and stop.     6.Step over obstacle: 2  Instructions: Begin walking at your normal speed. When you come to the shoebox, step over it, not around it, and keep walking.     (3) Normal: Is able to step over box without changing gait speed; no evidence for  imbalance.   (2) Mild impairment: Is able to step over shoe box, but must slow down and adjust steps to clear box safely.   (1) Moderate impairment: Is able to step over box but must stop, then step over. May require verbal cueing.   (0) Severe impairment: Cannot perform without assistance.     7.Step around obstacles: 3  Instructions: Begin walking at normal speed. When you come to the first cone (about 6??? away), walk around the right side of it. When you some to the second cone (6??? past first cone), walk around it to the left.     (3) Normal: Is able to walk safely around cones safely without changing gait speed; no evidence of imbalance.   (2) Mild impairment: Is able to step around both cones, but must slow down and adjust steps to clear cones.   (1) Moderate impairment: Is able to clear cones but must significantly slow speed to accomplish task, or requires verbal cueing.   (0) Severe impairment: Unable to clear cones, walks into one or both cones, or requires physical assistance.     8.Steps: 2  Instructions: Walk up these stairs as you would at home.(i.e. using a rail if necessary. At the top, turn around and walk down.     (3) Normal: Alternating feet, no rail.   (2) Mild impairment: Alternating feet, must use rail.   (1) Moderate impairment: Two feet to a stair, must use rail.   (0) Severe impairment: Cannot do safely.     TOTAL SCORE: 18/24  F:\Intranet\[facility] website\physiotherapy section\Dynamic Gait Index v.doc       Location of swelling:  L chest and axilla.     Skin: WFL except for the following:  increased skin thickness    Stemmer???s sign:no.      Sensation Intact to light touch:   Decreased sensation under axilla, around scar, and posterior lateral upper arm     Incision: healing appropriately for post op status, adhered to underlying tissue    Axillary Web Syndrome::yes     Posture/Observations:  Forward head, rounded shoulders        Range of Motion/Flexibilty:   UE WFL except  End range overhead activity.     Strength/MMT:   UE WFL     Total Treatment Time: 55 min        Treatment Rendered:      Manual therapy  X 55 min    - LUQ MLD anterior  And posterior anastamosis .    - lymphatouch RUQ   - Cording stretch RUQ       Today's Charges (noted here with $$):                          Equipment provided/recommended:   written HEP    Communication/consultation with other professionals:  Email to DME supplier [employer] re: garment needs        I attest that I have reviewed the above information.  Signed: Kirke Corin, PT   03/14/2018 9:40 AM

## 2018-03-15 ENCOUNTER — Ambulatory Visit: Admit: 2018-03-15 | Discharge: 2018-03-16

## 2018-03-15 DIAGNOSIS — C50412 Malignant neoplasm of upper-outer quadrant of left female breast: Principal | ICD-10-CM

## 2018-03-15 DIAGNOSIS — Z170 Estrogen receptor positive status [ER+]: Secondary | ICD-10-CM

## 2018-03-15 NOTE — Unmapped (Signed)
Radiation Treatment Management Note    Visit Date: 03/15/2018   Patient Name: Erika Cabrera   Date of Birth: [birth year]   MRN: [id number]  Age: 62 y.o.    Identifying Information and Diagnosis:  Breast cancer    Treatment Plan: Karmin L Tremont??is a??62 y.o.??female??left breast pT3pN2a, IDC, G2, ER+(95%)/PR+(30%)/HER2-neg,??5/6 LN positive, +LVI and positive surgical margins s/p BCT with targeted axillary LN bx on 07/03/17. Unfortunately her pathology revealed residual disease and she requires re-excision with ALND. Completed this on 08/14/17 showing 7 mm of residual tumor, negative margins, 2/23 +LN with larger tumor deposit of 6 mm. She is now s/p chemotherapy.     Assessment: Continue treatment as planned     Summary of Radiotherapy:  Left breast and L [place] fossa  15/25 fractions  3000cGy/ 5000cGy    Subjective and Interval History:  Tolerating treatment well. Mild erythema. Using cream daily. Asking insightful questions about radiation dose and follow up scans.    PHYSICAL EXAMINATION:  There were no vitals taken for this visit.     GENERAL:  Reveals a well-developed, well-nourished, post chemo and surgery. She has some fatigue today, but plans on going to [employer].  SKIN:  Mild erythema of the left breast, no desquamation or drainage  _____________________  Winferd Humphrey, MD, PhD  Radiation Oncology PGY-2  Pager: [phone]  03/15/2018  10:16 AM     I saw and evaluated and examined the patient, participating in the key portions of the service. I discussed the findings, assessment, and plan of care with the provider and patient. I agree with the findings and plan as documented in the note.     Trena Platt MD MS MPH  Assistant Professor  Department of Radiation Oncology  [place] [facility] [place] [facility]

## 2018-03-15 NOTE — Unmapped (Signed)
03/15/2018    Dose Site Summary:SiteLast TxDose  BJ:YNWGNFA: 03/15/2018: 3,000/5,000 cGy  OZ:HYQMVHQI: 03/15/2018: 3,000/5,000 cGy    Subjective/Assessment/Recommendations: Reports tolerating treatments well with no idenitified issues/concerns.     1. Skin: Intact, mild reddness. Using miaderm as skin product.   2. Lymphedema: Not present  3. Nutrition: No issues, Eating well and Weight stable  4. Fatigue: Mild  5. Pain: Denies pain  6. Elimination: No issues  7. Prescription Needs: None  8. Psychosocial: Accompanied by family member/other today-husband with pt.  9. Husband recovering from prostate cancer surgery.

## 2018-03-18 ENCOUNTER — Ambulatory Visit: Admit: 2018-03-18 | Discharge: 2018-03-19

## 2018-03-19 ENCOUNTER — Ambulatory Visit: Admit: 2018-03-19 | Discharge: 2018-03-20

## 2018-03-19 DIAGNOSIS — Z170 Estrogen receptor positive status [ER+]: Secondary | ICD-10-CM

## 2018-03-19 DIAGNOSIS — C50412 Malignant neoplasm of upper-outer quadrant of left female breast: Principal | ICD-10-CM

## 2018-03-19 DIAGNOSIS — I890 Lymphedema, not elsewhere classified: Secondary | ICD-10-CM

## 2018-03-19 NOTE — Unmapped (Signed)
-----   Message from Willeen Niece, MD sent at 03/18/2018  2:18 PM EDT -----  Regarding: get real and heel  Hi. Did we talk about get real and heel for this lady? Thanks!  C

## 2018-03-19 NOTE — Unmapped (Signed)
Per patients request and Dr. Avis Epley approval, a referral was emailed to [facility] re Get  [facility]al to contact patient for participation in the program. Contact information was provided as requested from Carly.

## 2018-03-19 NOTE — Unmapped (Signed)
Pre-op complete, no sedation, eliquis hold started 8/13, will resume 8/15 pm dose after procedure.

## 2018-03-20 ENCOUNTER — Ambulatory Visit: Admit: 2018-03-20 | Discharge: 2018-03-21

## 2018-03-21 ENCOUNTER — Encounter: Admit: 2018-03-21 | Discharge: 2018-03-21 | Payer: PRIVATE HEALTH INSURANCE

## 2018-03-21 ENCOUNTER — Ambulatory Visit: Admit: 2018-03-21 | Discharge: 2018-03-22

## 2018-03-21 DIAGNOSIS — C50912 Malignant neoplasm of unspecified site of left female breast: Principal | ICD-10-CM

## 2018-03-21 DIAGNOSIS — Z170 Estrogen receptor positive status [ER+]: Secondary | ICD-10-CM

## 2018-03-21 NOTE — Unmapped (Signed)
Discharge instructions discussed with pt and husband and supplies given to care for site.  Both verbalized understanding.  Site CDI, no hematoma noted

## 2018-03-21 NOTE — Unmapped (Signed)
Radiation Treatment Management Note    Visit Date: 03/22/2018   Patient Name: Erika Cabrera   Date of Birth: 13-Apr-1956   MRN: [id number]  Age: 62 y.o.    Identifying Information and Diagnosis:  Breast cancer    Treatment Plan: Maribeth L Soltis??is a??62 y.o.??female??left breast pT3pN2a, IDC, G2, ER+(95%)/PR+(30%)/HER2-neg,??5/6 LN positive, +LVI and positive surgical margins s/p BCT with targeted axillary LN bx on 07/03/17. Unfortunately her pathology revealed residual disease and she requires re-excision with ALND. Completed this on 08/14/17 showing 7 mm of residual tumor, negative margins, 2/23 +LN with larger tumor deposit of 6 mm. She is now s/p chemotherapy.     Assessment: Continue treatment as planned. Skin care discussed    Summary of Radiotherapy:  Left breast and L [place] fossa  20/25 fractions  4000cGy/ 5000cGy    Boost   0/5 fx  0/1000cGy    Subjective and Interval History:  Tolerating treatment well. Mild erythema. Using cream daily. Again asking insightful questions about radiation dose and follow up scans. Asked about cure vs remission. Energy improved this week. States that she has lost some of her appetite because she has little interest in food, though she has been making sure to eat because she knows she needs the calories.    PHYSICAL EXAMINATION:  Pulse 66  - Temp 37 ??C (98.6 ??F) (Temporal)  - Resp 18  - Wt 82.1 kg (181 lb)  - SpO2 97%  - BMI 28.08 kg/m??      GENERAL:  Reveals a well-developed, well-nourished, post chemo and surgery. Fatigue improved.  SKIN:  Mild erythema of the left breast, no desquamation or drainage  _____________________  Winferd Humphrey, MD, PhD  Radiation Oncology PGY-2  Pager: [phone]  03/22/2018  3:49 PM

## 2018-03-21 NOTE — Unmapped (Signed)
Pt received from PRU. Report from PRU RN. Pt ID/Procedure/Consent/Allergies/Airway verified. Pt Placed on vital sign monitors.

## 2018-03-21 NOTE — Unmapped (Signed)
[place] INTERVENTIONAL RADIOLOGY - Operative Note     VIR Post-Procedure Note    Procedure Name: Chest port removal    Pre-Op Diagnosis: Left breast cancer    Post-Op Diagnosis: Same as pre-operative diagnosis    VIR Providers    Attending: Janey Genta, MD, PhD  Resident/Fellow/APP: None    Description of procedure: Successful removal of the existing single lumen right IJ chest port    Estimated Blood Loss: approximately 1 mL  Complications: None    See detailed procedure note in PACS [facility]).    The patient tolerated the procedure well without incident or complication and left the room in stable condition.    Janey Genta, MD, PhD  03/21/2018 9:21 AM

## 2018-03-21 NOTE — Unmapped (Signed)
[place] INTERVENTIONAL RADIOLOGY - Pre Procedure H&P      Assessment/Plan:    Ms. Lablanc is a 62 y.o. female who will undergo chest port removal in Interventional Radiology.    --This procedure has been fully reviewed with the patient/patient???s authorized representative. The risks, benefits and alternatives have been explained, and the patient/patient???s authorized representative has consented to the procedure.  --The patient will accept blood products in an emergent situation.  --The patient does not have a Do Not Resuscitate order in effect.      HPI: Ms. Wist is a 62 y.o. female with left breast cancer presenting for chest port removal following completion of chemotherapy.    Allergies:   Allergies   Allergen Reactions   ??? Erythromycin Diarrhea   ??? Penicillins Rash       Medications:  Eliquis - Held for 48 hours    ASA Grade: ASA 2 - Patient with mild systemic disease with no functional limitations    PE:    Vitals:    03/21/18 0819   BP: 113/73   Pulse: 100   Resp: 16   Temp: 36.4 ??C (97.5 ??F)   SpO2: 98%     General: female in NAD.  Airway assessment: Class 2 - Can visualize soft palate and fauces, tip of uvula is obscured  Lungs: Respirations nonlabored    Janey Genta, MD, PhD  03/21/2018 8:21 AM

## 2018-03-21 NOTE — Unmapped (Signed)
PT transported to PRU via stretcher by Procedural RN. Report given to PRU RN.

## 2018-03-22 ENCOUNTER — Ambulatory Visit: Admit: 2018-03-22 | Discharge: 2018-03-23

## 2018-03-22 DIAGNOSIS — C50412 Malignant neoplasm of upper-outer quadrant of left female breast: Principal | ICD-10-CM

## 2018-03-22 DIAGNOSIS — Z170 Estrogen receptor positive status [ER+]: Secondary | ICD-10-CM

## 2018-03-22 NOTE — Unmapped (Signed)
03/22/2018    Dose Site Summary:    Treatment Plan: Erika Cabrera??is a??62 y.o.??female??left breast pT3pN2a, IDC, G2, ER+(95%)/PR+(30%)/HER2-neg,??5/6 LN positive, +LVI and positive surgical margins s/p BCT with targeted axillary LN bx on 07/03/17. Unfortunately her pathology revealed residual disease and she requires re-excision with ALND. Completed this on 08/14/17 showing 7 mm of residual tumor, negative margins, 2/23 +LN with larger tumor deposit of 6 mm. She is now s/p chemotherapy.    UJ:WJXBJYN: 03/22/2018: 4,000/5,000 cGy  WG:NFAOZHYQ: 03/22/2018: 4,000/5,000 cGy    Subjective/Assessment/Recommendations:    1. Skin: No problems and Intact  2. Lymphedema: Not present  3. Nutrition: No issues and Eating well  4. Fatigue: Mild  5. Pain: Denies pain  6. Elimination: No issues  7. Prescription Needs: None  8. Psychosocial: No issues    Patient reports feeling well over all with slightly decreased appetite.

## 2018-03-25 ENCOUNTER — Ambulatory Visit: Admit: 2018-03-25 | Discharge: 2018-03-26

## 2018-03-25 MED ORDER — DULOXETINE 20 MG CAPSULE,DELAYED RELEASE
ORAL_CAPSULE | Freq: Every day | ORAL | 0 refills | 0 days | Status: CP
Start: 2018-03-25 — End: 2018-04-10

## 2018-03-26 ENCOUNTER — Encounter
Admit: 2018-03-26 | Discharge: 2018-03-26 | Disposition: A | Discharge status: Home / Self Care | Payer: PRIVATE HEALTH INSURANCE | Attending: Family

## 2018-03-26 ENCOUNTER — Ambulatory Visit: Admit: 2018-03-26 | Discharge: 2018-03-27

## 2018-03-26 DIAGNOSIS — M25561 Pain in right knee: Principal | ICD-10-CM

## 2018-03-26 MED ORDER — PREDNISONE 20 MG TABLET
ORAL_TABLET | 0 refills | 0 days | Status: CP
Start: 2018-03-26 — End: 2018-06-24

## 2018-03-27 ENCOUNTER — Ambulatory Visit: Admit: 2018-03-27 | Discharge: 2018-03-28

## 2018-03-28 ENCOUNTER — Encounter
Admit: 2018-03-28 | Discharge: 2018-04-22 | Payer: PRIVATE HEALTH INSURANCE | Attending: Rehabilitative and Restorative Service Providers" | Primary: Rehabilitative and Restorative Service Providers"

## 2018-03-28 ENCOUNTER — Ambulatory Visit
Admit: 2018-03-28 | Discharge: 2018-04-22 | Payer: PRIVATE HEALTH INSURANCE | Attending: Rehabilitative and Restorative Service Providers" | Primary: Rehabilitative and Restorative Service Providers"

## 2018-03-28 ENCOUNTER — Ambulatory Visit: Admit: 2018-03-28 | Discharge: 2018-03-29

## 2018-03-28 DIAGNOSIS — I890 Lymphedema, not elsewhere classified: Secondary | ICD-10-CM

## 2018-03-28 DIAGNOSIS — Z170 Estrogen receptor positive status [ER+]: Secondary | ICD-10-CM

## 2018-03-28 DIAGNOSIS — M5126 Other intervertebral disc displacement, lumbar region: Secondary | ICD-10-CM

## 2018-03-28 DIAGNOSIS — C50412 Malignant neoplasm of upper-outer quadrant of left female breast: Principal | ICD-10-CM

## 2018-03-29 ENCOUNTER — Ambulatory Visit: Admit: 2018-03-29 | Discharge: 2018-03-30

## 2018-03-29 DIAGNOSIS — Z170 Estrogen receptor positive status [ER+]: Secondary | ICD-10-CM

## 2018-03-29 DIAGNOSIS — C50412 Malignant neoplasm of upper-outer quadrant of left female breast: Principal | ICD-10-CM

## 2018-04-01 ENCOUNTER — Ambulatory Visit: Admit: 2018-04-01 | Discharge: 2018-04-02

## 2018-04-02 ENCOUNTER — Ambulatory Visit: Admit: 2018-04-02 | Discharge: 2018-04-03

## 2018-04-03 ENCOUNTER — Ambulatory Visit: Admit: 2018-04-03 | Discharge: 2018-04-04

## 2018-04-03 DIAGNOSIS — Z510 Encounter for antineoplastic radiation therapy: Principal | ICD-10-CM

## 2018-04-04 ENCOUNTER — Ambulatory Visit: Admit: 2018-04-04 | Discharge: 2018-04-05

## 2018-04-04 DIAGNOSIS — C50412 Malignant neoplasm of upper-outer quadrant of left female breast: Principal | ICD-10-CM

## 2018-04-04 DIAGNOSIS — I890 Lymphedema, not elsewhere classified: Secondary | ICD-10-CM

## 2018-04-04 DIAGNOSIS — Z170 Estrogen receptor positive status [ER+]: Secondary | ICD-10-CM

## 2018-04-05 ENCOUNTER — Ambulatory Visit: Admit: 2018-04-05 | Discharge: 2018-04-06

## 2018-04-05 ENCOUNTER — Encounter: Admit: 2018-04-05 | Discharge: 2018-04-06 | Payer: PRIVATE HEALTH INSURANCE

## 2018-04-05 DIAGNOSIS — M10062 Idiopathic gout, left knee: Secondary | ICD-10-CM

## 2018-04-05 DIAGNOSIS — F3341 Major depressive disorder, recurrent, in partial remission: Principal | ICD-10-CM

## 2018-04-05 DIAGNOSIS — C50912 Malignant neoplasm of unspecified site of left female breast: Principal | ICD-10-CM

## 2018-04-05 DIAGNOSIS — Z170 Estrogen receptor positive status [ER+]: Secondary | ICD-10-CM

## 2018-04-05 DIAGNOSIS — G629 Polyneuropathy, unspecified: Secondary | ICD-10-CM

## 2018-04-10 MED ORDER — DULOXETINE 30 MG CAPSULE,DELAYED RELEASE
ORAL_CAPSULE | Freq: Every day | ORAL | 3 refills | 0.00000 days | Status: CP
Start: 2018-04-10 — End: 2019-04-10

## 2018-04-15 ENCOUNTER — Ambulatory Visit: Admit: 2018-04-15 | Discharge: 2018-04-15 | Payer: PRIVATE HEALTH INSURANCE

## 2018-04-15 DIAGNOSIS — E2839 Other primary ovarian failure: Principal | ICD-10-CM

## 2018-04-16 DIAGNOSIS — I890 Lymphedema, not elsewhere classified: Secondary | ICD-10-CM

## 2018-04-16 DIAGNOSIS — C50412 Malignant neoplasm of upper-outer quadrant of left female breast: Principal | ICD-10-CM

## 2018-04-16 DIAGNOSIS — Z170 Estrogen receptor positive status [ER+]: Secondary | ICD-10-CM

## 2018-04-18 ENCOUNTER — Encounter: Admit: 2018-04-18 | Discharge: 2018-04-19 | Payer: PRIVATE HEALTH INSURANCE

## 2018-04-18 DIAGNOSIS — C50412 Malignant neoplasm of upper-outer quadrant of left female breast: Principal | ICD-10-CM

## 2018-04-18 DIAGNOSIS — Z170 Estrogen receptor positive status [ER+]: Secondary | ICD-10-CM

## 2018-04-18 MED ORDER — BUPROPION HCL XL 150 MG 24 HR TABLET, EXTENDED RELEASE
ORAL_TABLET | Freq: Every morning | ORAL | 0 refills | 0 days | Status: CP
Start: 2018-04-18 — End: 2018-05-02

## 2018-05-01 ENCOUNTER — Encounter
Admit: 2018-05-01 | Discharge: 2018-05-22 | Payer: PRIVATE HEALTH INSURANCE | Attending: Rehabilitative and Restorative Service Providers" | Primary: Rehabilitative and Restorative Service Providers"

## 2018-05-01 DIAGNOSIS — I890 Lymphedema, not elsewhere classified: Secondary | ICD-10-CM

## 2018-05-01 DIAGNOSIS — Z170 Estrogen receptor positive status [ER+]: Secondary | ICD-10-CM

## 2018-05-01 DIAGNOSIS — C50412 Malignant neoplasm of upper-outer quadrant of left female breast: Principal | ICD-10-CM

## 2018-05-07 DIAGNOSIS — C50412 Malignant neoplasm of upper-outer quadrant of left female breast: Principal | ICD-10-CM

## 2018-05-07 DIAGNOSIS — Z170 Estrogen receptor positive status [ER+]: Secondary | ICD-10-CM

## 2018-05-07 DIAGNOSIS — I890 Lymphedema, not elsewhere classified: Secondary | ICD-10-CM

## 2018-05-24 ENCOUNTER — Encounter
Admit: 2018-05-24 | Discharge: 2018-06-21 | Payer: PRIVATE HEALTH INSURANCE | Attending: Rehabilitative and Restorative Service Providers" | Primary: Rehabilitative and Restorative Service Providers"

## 2018-05-24 ENCOUNTER — Ambulatory Visit
Admit: 2018-05-24 | Discharge: 2018-06-21 | Payer: PRIVATE HEALTH INSURANCE | Attending: Rehabilitative and Restorative Service Providers" | Primary: Rehabilitative and Restorative Service Providers"

## 2018-05-24 ENCOUNTER — Non-Acute Institutional Stay
Admit: 2018-05-24 | Discharge: 2018-06-21 | Payer: PRIVATE HEALTH INSURANCE | Attending: Rehabilitative and Restorative Service Providers" | Primary: Rehabilitative and Restorative Service Providers"

## 2018-05-24 DIAGNOSIS — Z170 Estrogen receptor positive status [ER+]: Secondary | ICD-10-CM

## 2018-05-24 DIAGNOSIS — C50412 Malignant neoplasm of upper-outer quadrant of left female breast: Principal | ICD-10-CM

## 2018-05-24 DIAGNOSIS — I890 Lymphedema, not elsewhere classified: Secondary | ICD-10-CM

## 2018-05-27 DIAGNOSIS — C50412 Malignant neoplasm of upper-outer quadrant of left female breast: Principal | ICD-10-CM

## 2018-05-27 DIAGNOSIS — I890 Lymphedema, not elsewhere classified: Secondary | ICD-10-CM

## 2018-05-27 DIAGNOSIS — Z170 Estrogen receptor positive status [ER+]: Secondary | ICD-10-CM

## 2018-06-03 DIAGNOSIS — I890 Lymphedema, not elsewhere classified: Secondary | ICD-10-CM

## 2018-06-03 DIAGNOSIS — Z170 Estrogen receptor positive status [ER+]: Secondary | ICD-10-CM

## 2018-06-03 DIAGNOSIS — C50412 Malignant neoplasm of upper-outer quadrant of left female breast: Principal | ICD-10-CM

## 2018-06-10 DIAGNOSIS — I890 Lymphedema, not elsewhere classified: Secondary | ICD-10-CM

## 2018-06-10 DIAGNOSIS — C50412 Malignant neoplasm of upper-outer quadrant of left female breast: Principal | ICD-10-CM

## 2018-06-10 DIAGNOSIS — Z170 Estrogen receptor positive status [ER+]: Secondary | ICD-10-CM

## 2018-06-11 ENCOUNTER — Encounter
Admit: 2018-06-11 | Discharge: 2018-06-11 | Payer: PRIVATE HEALTH INSURANCE | Attending: Medical Oncology | Primary: Medical Oncology

## 2018-06-11 ENCOUNTER — Encounter: Admit: 2018-06-11 | Discharge: 2018-06-11 | Payer: PRIVATE HEALTH INSURANCE

## 2018-06-11 DIAGNOSIS — C50412 Malignant neoplasm of upper-outer quadrant of left female breast: Secondary | ICD-10-CM

## 2018-06-11 DIAGNOSIS — Z170 Estrogen receptor positive status [ER+]: Secondary | ICD-10-CM

## 2018-06-11 DIAGNOSIS — C50912 Malignant neoplasm of unspecified site of left female breast: Principal | ICD-10-CM

## 2018-06-11 DIAGNOSIS — G629 Polyneuropathy, unspecified: Principal | ICD-10-CM

## 2018-06-13 DIAGNOSIS — N651 Disproportion of reconstructed breast: Principal | ICD-10-CM

## 2018-06-14 ENCOUNTER — Encounter
Admit: 2018-06-14 | Discharge: 2018-06-15 | Payer: PRIVATE HEALTH INSURANCE | Attending: Certified Registered" | Primary: Certified Registered"

## 2018-06-14 ENCOUNTER — Encounter: Admit: 2018-06-14 | Discharge: 2018-06-15 | Payer: PRIVATE HEALTH INSURANCE

## 2018-06-14 DIAGNOSIS — N651 Disproportion of reconstructed breast: Principal | ICD-10-CM

## 2018-06-14 MED ORDER — CLINDAMYCIN HCL 300 MG CAPSULE
ORAL_CAPSULE | Freq: Three times a day (TID) | ORAL | 0 refills | 0.00000 days | Status: CP
Start: 2018-06-14 — End: 2018-06-15

## 2018-06-14 MED ORDER — OXYCODONE 5 MG TABLET
ORAL_TABLET | ORAL | 0 refills | 0 days | Status: CP | PRN
Start: 2018-06-14 — End: 2018-06-15

## 2018-06-15 MED ORDER — OXYCODONE 5 MG TABLET
ORAL_TABLET | ORAL | 0 refills | 0.00000 days | Status: CP | PRN
Start: 2018-06-15 — End: 2018-06-24

## 2018-06-15 MED ORDER — CLINDAMYCIN HCL 300 MG CAPSULE
ORAL_CAPSULE | Freq: Three times a day (TID) | ORAL | 0 refills | 0 days | Status: CP
Start: 2018-06-15 — End: 2018-06-24

## 2018-06-24 ENCOUNTER — Encounter: Admit: 2018-06-24 | Discharge: 2018-06-25 | Payer: PRIVATE HEALTH INSURANCE

## 2018-06-24 DIAGNOSIS — N651 Disproportion of reconstructed breast: Principal | ICD-10-CM

## 2018-06-28 ENCOUNTER — Encounter: Admit: 2018-06-28 | Discharge: 2018-07-06 | Payer: PRIVATE HEALTH INSURANCE

## 2018-06-28 DIAGNOSIS — C50412 Malignant neoplasm of upper-outer quadrant of left female breast: Principal | ICD-10-CM

## 2018-06-28 DIAGNOSIS — Z170 Estrogen receptor positive status [ER+]: Secondary | ICD-10-CM

## 2018-07-01 ENCOUNTER — Encounter: Admit: 2018-07-01 | Discharge: 2018-07-02 | Payer: PRIVATE HEALTH INSURANCE

## 2018-07-01 ENCOUNTER — Ambulatory Visit: Admit: 2018-07-01 | Discharge: 2018-07-02 | Payer: PRIVATE HEALTH INSURANCE

## 2018-07-01 DIAGNOSIS — N651 Disproportion of reconstructed breast: Principal | ICD-10-CM

## 2018-07-08 ENCOUNTER — Encounter
Admit: 2018-07-08 | Discharge: 2018-07-21 | Payer: PRIVATE HEALTH INSURANCE | Attending: Rehabilitative and Restorative Service Providers" | Primary: Rehabilitative and Restorative Service Providers"

## 2018-07-08 DIAGNOSIS — I890 Lymphedema, not elsewhere classified: Secondary | ICD-10-CM

## 2018-07-08 DIAGNOSIS — Z170 Estrogen receptor positive status [ER+]: Secondary | ICD-10-CM

## 2018-07-08 DIAGNOSIS — C50412 Malignant neoplasm of upper-outer quadrant of left female breast: Principal | ICD-10-CM

## 2018-07-15 ENCOUNTER — Ambulatory Visit: Admit: 2018-07-15 | Discharge: 2018-07-16 | Payer: PRIVATE HEALTH INSURANCE

## 2018-07-15 DIAGNOSIS — C50412 Malignant neoplasm of upper-outer quadrant of left female breast: Principal | ICD-10-CM

## 2018-07-15 DIAGNOSIS — I890 Lymphedema, not elsewhere classified: Secondary | ICD-10-CM

## 2018-07-15 DIAGNOSIS — Z170 Estrogen receptor positive status [ER+]: Secondary | ICD-10-CM

## 2018-07-15 DIAGNOSIS — N651 Disproportion of reconstructed breast: Principal | ICD-10-CM

## 2018-07-22 ENCOUNTER — Ambulatory Visit
Admit: 2018-07-22 | Discharge: 2018-08-20 | Payer: PRIVATE HEALTH INSURANCE | Attending: Rehabilitative and Restorative Service Providers" | Primary: Rehabilitative and Restorative Service Providers"

## 2018-07-22 ENCOUNTER — Encounter
Admit: 2018-07-22 | Discharge: 2018-08-20 | Payer: PRIVATE HEALTH INSURANCE | Attending: Rehabilitative and Restorative Service Providers" | Primary: Rehabilitative and Restorative Service Providers"

## 2018-07-22 DIAGNOSIS — I890 Lymphedema, not elsewhere classified: Secondary | ICD-10-CM

## 2018-07-22 DIAGNOSIS — Z170 Estrogen receptor positive status [ER+]: Secondary | ICD-10-CM

## 2018-07-22 DIAGNOSIS — C50412 Malignant neoplasm of upper-outer quadrant of left female breast: Principal | ICD-10-CM

## 2018-07-26 ENCOUNTER — Encounter: Admit: 2018-07-26 | Discharge: 2018-07-27 | Payer: PRIVATE HEALTH INSURANCE

## 2018-07-26 DIAGNOSIS — L819 Disorder of pigmentation, unspecified: Principal | ICD-10-CM

## 2018-07-27 ENCOUNTER — Ambulatory Visit: Admit: 2018-07-27 | Discharge: 2018-07-29 | Payer: PRIVATE HEALTH INSURANCE

## 2018-07-27 DIAGNOSIS — R400 Somnolence: Principal | ICD-10-CM

## 2018-07-29 DIAGNOSIS — C50412 Malignant neoplasm of upper-outer quadrant of left female breast: Principal | ICD-10-CM

## 2018-07-29 DIAGNOSIS — I890 Lymphedema, not elsewhere classified: Secondary | ICD-10-CM

## 2018-07-29 DIAGNOSIS — Z170 Estrogen receptor positive status [ER+]: Secondary | ICD-10-CM

## 2018-08-05 ENCOUNTER — Encounter: Admit: 2018-08-05 | Discharge: 2018-08-06 | Payer: PRIVATE HEALTH INSURANCE

## 2018-08-05 DIAGNOSIS — N651 Disproportion of reconstructed breast: Principal | ICD-10-CM

## 2018-08-12 ENCOUNTER — Encounter: Admit: 2018-08-12 | Discharge: 2018-08-13 | Payer: PRIVATE HEALTH INSURANCE

## 2018-08-12 DIAGNOSIS — Z85828 Personal history of other malignant neoplasm of skin: Principal | ICD-10-CM

## 2018-08-19 ENCOUNTER — Encounter: Admit: 2018-08-19 | Discharge: 2018-08-20 | Payer: PRIVATE HEALTH INSURANCE

## 2018-08-19 DIAGNOSIS — N651 Disproportion of reconstructed breast: Principal | ICD-10-CM

## 2018-08-31 ENCOUNTER — Encounter: Admit: 2018-08-31 | Discharge: 2018-09-02 | Payer: PRIVATE HEALTH INSURANCE

## 2018-08-31 DIAGNOSIS — G4733 Obstructive sleep apnea (adult) (pediatric): Principal | ICD-10-CM

## 2018-09-04 ENCOUNTER — Encounter
Admit: 2018-09-04 | Discharge: 2018-10-03 | Payer: PRIVATE HEALTH INSURANCE | Attending: Rehabilitative and Restorative Service Providers" | Primary: Rehabilitative and Restorative Service Providers"

## 2018-09-04 DIAGNOSIS — Z170 Estrogen receptor positive status [ER+]: Secondary | ICD-10-CM

## 2018-09-04 DIAGNOSIS — C50412 Malignant neoplasm of upper-outer quadrant of left female breast: Principal | ICD-10-CM

## 2018-09-04 DIAGNOSIS — I890 Lymphedema, not elsewhere classified: Secondary | ICD-10-CM

## 2018-09-09 ENCOUNTER — Ambulatory Visit: Admit: 2018-09-09 | Discharge: 2018-09-10 | Payer: PRIVATE HEALTH INSURANCE | Attending: Family | Primary: Family

## 2018-09-09 ENCOUNTER — Encounter: Admit: 2018-09-09 | Discharge: 2018-09-10 | Payer: PRIVATE HEALTH INSURANCE

## 2018-09-09 DIAGNOSIS — I890 Lymphedema, not elsewhere classified: Secondary | ICD-10-CM

## 2018-09-09 DIAGNOSIS — Z170 Estrogen receptor positive status [ER+]: Secondary | ICD-10-CM

## 2018-09-09 DIAGNOSIS — C50412 Malignant neoplasm of upper-outer quadrant of left female breast: Principal | ICD-10-CM

## 2018-09-09 DIAGNOSIS — N651 Disproportion of reconstructed breast: Principal | ICD-10-CM

## 2018-09-16 DIAGNOSIS — Z170 Estrogen receptor positive status [ER+]: Secondary | ICD-10-CM

## 2018-09-16 DIAGNOSIS — C50412 Malignant neoplasm of upper-outer quadrant of left female breast: Principal | ICD-10-CM

## 2018-09-16 DIAGNOSIS — I890 Lymphedema, not elsewhere classified: Secondary | ICD-10-CM

## 2018-09-27 ENCOUNTER — Encounter: Admit: 2018-09-27 | Discharge: 2018-09-28 | Payer: PRIVATE HEALTH INSURANCE

## 2018-09-27 ENCOUNTER — Ambulatory Visit
Admit: 2018-09-27 | Discharge: 2018-09-28 | Payer: PRIVATE HEALTH INSURANCE | Attending: Adult Health | Primary: Adult Health

## 2018-09-27 DIAGNOSIS — C50412 Malignant neoplasm of upper-outer quadrant of left female breast: Principal | ICD-10-CM

## 2018-09-27 DIAGNOSIS — Z170 Estrogen receptor positive status [ER+]: Secondary | ICD-10-CM

## 2018-09-27 DIAGNOSIS — R2232 Localized swelling, mass and lump, left upper limb: Secondary | ICD-10-CM

## 2018-09-30 DIAGNOSIS — C50412 Malignant neoplasm of upper-outer quadrant of left female breast: Principal | ICD-10-CM

## 2018-09-30 DIAGNOSIS — Z170 Estrogen receptor positive status [ER+]: Secondary | ICD-10-CM

## 2018-09-30 DIAGNOSIS — I890 Lymphedema, not elsewhere classified: Secondary | ICD-10-CM

## 2018-11-19 ENCOUNTER — Encounter: Admit: 2018-11-19 | Discharge: 2018-11-20 | Payer: PRIVATE HEALTH INSURANCE

## 2018-11-19 DIAGNOSIS — G4733 Obstructive sleep apnea (adult) (pediatric): Principal | ICD-10-CM

## 2018-11-19 DIAGNOSIS — K219 Gastro-esophageal reflux disease without esophagitis: Secondary | ICD-10-CM

## 2019-01-13 ENCOUNTER — Encounter: Admit: 2019-01-13 | Discharge: 2019-01-14 | Payer: PRIVATE HEALTH INSURANCE | Attending: Family | Primary: Family

## 2019-01-13 DIAGNOSIS — Z170 Estrogen receptor positive status [ER+]: Secondary | ICD-10-CM

## 2019-01-13 DIAGNOSIS — C50412 Malignant neoplasm of upper-outer quadrant of left female breast: Principal | ICD-10-CM

## 2019-01-13 MED ORDER — LETROZOLE 2.5 MG TABLET
ORAL_TABLET | Freq: Every day | ORAL | 3 refills | 0 days | Status: CP
Start: 2019-01-13 — End: ?

## 2019-02-24 ENCOUNTER — Encounter: Admit: 2019-02-24 | Discharge: 2019-02-25 | Payer: PRIVATE HEALTH INSURANCE | Attending: Family | Primary: Family

## 2019-02-24 DIAGNOSIS — Z20828 Contact with and (suspected) exposure to other viral communicable diseases: Principal | ICD-10-CM

## 2019-03-17 ENCOUNTER — Encounter: Admit: 2019-03-17 | Discharge: 2019-03-18 | Payer: PRIVATE HEALTH INSURANCE

## 2019-03-17 DIAGNOSIS — N651 Disproportion of reconstructed breast: Principal | ICD-10-CM

## 2019-06-10 MED ORDER — DULOXETINE 30 MG CAPSULE,DELAYED RELEASE
0 refills | 0 days | Status: CP
Start: 2019-06-10 — End: ?

## 2019-07-14 ENCOUNTER — Encounter: Admit: 2019-07-14 | Discharge: 2019-07-15 | Payer: PRIVATE HEALTH INSURANCE | Attending: Family | Primary: Family

## 2019-07-14 DIAGNOSIS — C50412 Malignant neoplasm of upper-outer quadrant of left female breast: Principal | ICD-10-CM

## 2019-07-14 DIAGNOSIS — Z170 Estrogen receptor positive status [ER+]: Secondary | ICD-10-CM

## 2019-07-14 DIAGNOSIS — M25511 Pain in right shoulder: Principal | ICD-10-CM

## 2019-07-14 MED ORDER — LETROZOLE 2.5 MG TABLET
ORAL_TABLET | Freq: Every day | ORAL | 3 refills | 90.00000 days | Status: CP
Start: 2019-07-14 — End: ?

## 2019-07-21 ENCOUNTER — Ambulatory Visit: Admit: 2019-07-21 | Discharge: 2019-08-07 | Payer: PRIVATE HEALTH INSURANCE

## 2019-07-21 ENCOUNTER — Encounter: Admit: 2019-07-21 | Discharge: 2019-08-07 | Payer: PRIVATE HEALTH INSURANCE

## 2019-07-24 ENCOUNTER — Ambulatory Visit: Admit: 2019-07-24 | Discharge: 2019-07-25 | Payer: PRIVATE HEALTH INSURANCE

## 2019-07-24 DIAGNOSIS — M25511 Pain in right shoulder: Principal | ICD-10-CM

## 2019-07-24 DIAGNOSIS — C50412 Malignant neoplasm of upper-outer quadrant of left female breast: Principal | ICD-10-CM

## 2019-07-24 DIAGNOSIS — Z170 Estrogen receptor positive status [ER+]: Principal | ICD-10-CM

## 2019-11-14 ENCOUNTER — Telehealth: Admit: 2019-11-14 | Discharge: 2019-11-15 | Payer: PRIVATE HEALTH INSURANCE | Attending: Family | Primary: Family

## 2019-11-14 DIAGNOSIS — Z170 Estrogen receptor positive status [ER+]: Secondary | ICD-10-CM

## 2019-11-14 DIAGNOSIS — C50412 Malignant neoplasm of upper-outer quadrant of left female breast: Principal | ICD-10-CM

## 2019-11-14 MED ORDER — LETROZOLE 2.5 MG TABLET
ORAL_TABLET | Freq: Every day | ORAL | 3 refills | 90 days | Status: CP
Start: 2019-11-14 — End: ?

## 2019-11-27 MED ORDER — DULOXETINE 30 MG CAPSULE,DELAYED RELEASE
ORAL_CAPSULE | Freq: Every day | ORAL | 0 refills | 60 days | Status: CP
Start: 2019-11-27 — End: 2020-01-26

## 2019-12-17 ENCOUNTER — Encounter: Admit: 2019-12-17 | Discharge: 2019-12-18 | Payer: PRIVATE HEALTH INSURANCE

## 2019-12-17 MED ORDER — DULOXETINE 60 MG CAPSULE,DELAYED RELEASE
ORAL_CAPSULE | Freq: Every day | ORAL | 3 refills | 90.00000 days | Status: CP
Start: 2019-12-17 — End: 2020-12-16

## 2019-12-22 ENCOUNTER — Encounter
Admit: 2019-12-22 | Discharge: 2019-12-23 | Payer: PRIVATE HEALTH INSURANCE | Attending: Student in an Organized Health Care Education/Training Program | Primary: Student in an Organized Health Care Education/Training Program

## 2019-12-22 DIAGNOSIS — S161XXA Strain of muscle, fascia and tendon at neck level, initial encounter: Principal | ICD-10-CM

## 2020-03-19 ENCOUNTER — Ambulatory Visit
Admit: 2020-03-19 | Discharge: 2020-03-20 | Payer: PRIVATE HEALTH INSURANCE | Attending: Medical Oncology | Primary: Medical Oncology

## 2020-03-19 DIAGNOSIS — C50412 Malignant neoplasm of upper-outer quadrant of left female breast: Principal | ICD-10-CM

## 2020-03-19 DIAGNOSIS — Z170 Estrogen receptor positive status [ER+]: Secondary | ICD-10-CM

## 2020-03-19 DIAGNOSIS — G629 Polyneuropathy, unspecified: Principal | ICD-10-CM

## 2020-03-19 DIAGNOSIS — D649 Anemia, unspecified: Principal | ICD-10-CM

## 2020-03-19 DIAGNOSIS — Z136 Encounter for screening for cardiovascular disorders: Principal | ICD-10-CM

## 2020-04-01 ENCOUNTER — Telehealth: Admit: 2020-04-01 | Discharge: 2020-04-02 | Payer: PRIVATE HEALTH INSURANCE

## 2020-04-14 ENCOUNTER — Encounter: Admit: 2020-04-14 | Discharge: 2020-04-15 | Payer: PRIVATE HEALTH INSURANCE

## 2020-05-27 ENCOUNTER — Ambulatory Visit: Admit: 2020-05-27 | Payer: PRIVATE HEALTH INSURANCE

## 2020-07-23 ENCOUNTER — Encounter: Admit: 2020-07-23 | Discharge: 2020-08-06 | Payer: PRIVATE HEALTH INSURANCE

## 2020-07-23 DIAGNOSIS — Z880 Allergy status to penicillin: Principal | ICD-10-CM

## 2020-07-23 DIAGNOSIS — Z800 Family history of malignant neoplasm of digestive organs: Principal | ICD-10-CM

## 2020-07-23 DIAGNOSIS — Z9012 Acquired absence of left breast and nipple: Principal | ICD-10-CM

## 2020-07-23 DIAGNOSIS — Z170 Estrogen receptor positive status [ER+]: Principal | ICD-10-CM

## 2020-07-23 DIAGNOSIS — C50412 Malignant neoplasm of upper-outer quadrant of left female breast: Principal | ICD-10-CM

## 2020-07-23 DIAGNOSIS — Z9221 Personal history of antineoplastic chemotherapy: Principal | ICD-10-CM

## 2020-07-23 DIAGNOSIS — Z803 Family history of malignant neoplasm of breast: Principal | ICD-10-CM

## 2020-07-23 DIAGNOSIS — I82B11 Acute embolism and thrombosis of right subclavian vein: Principal | ICD-10-CM

## 2020-07-23 DIAGNOSIS — Z510 Encounter for antineoplastic radiation therapy: Principal | ICD-10-CM

## 2020-07-23 DIAGNOSIS — G629 Polyneuropathy, unspecified: Principal | ICD-10-CM

## 2020-07-23 DIAGNOSIS — Z7901 Long term (current) use of anticoagulants: Principal | ICD-10-CM

## 2020-07-23 DIAGNOSIS — C773 Secondary and unspecified malignant neoplasm of axilla and upper limb lymph nodes: Principal | ICD-10-CM

## 2020-08-10 ENCOUNTER — Encounter
Admit: 2020-08-10 | Discharge: 2020-08-12 | Disposition: A | Discharge status: Home / Self Care | Payer: PRIVATE HEALTH INSURANCE | Attending: Anesthesiology | Admitting: Hematology & Oncology

## 2020-08-10 ENCOUNTER — Ambulatory Visit
Admit: 2020-08-10 | Discharge: 2020-08-12 | Disposition: A | Discharge status: Home / Self Care | Payer: PRIVATE HEALTH INSURANCE | Admitting: Hematology & Oncology

## 2020-08-10 DIAGNOSIS — K648 Other hemorrhoids: Principal | ICD-10-CM

## 2020-08-10 DIAGNOSIS — Z880 Allergy status to penicillin: Principal | ICD-10-CM

## 2020-08-10 DIAGNOSIS — C50412 Malignant neoplasm of upper-outer quadrant of left female breast: Principal | ICD-10-CM

## 2020-08-10 DIAGNOSIS — Z6832 Body mass index (BMI) 32.0-32.9, adult: Principal | ICD-10-CM

## 2020-08-10 DIAGNOSIS — F32A Depression, unspecified: Principal | ICD-10-CM

## 2020-08-10 DIAGNOSIS — R197 Diarrhea, unspecified: Principal | ICD-10-CM

## 2020-08-10 DIAGNOSIS — Z803 Family history of malignant neoplasm of breast: Principal | ICD-10-CM

## 2020-08-10 DIAGNOSIS — G4733 Obstructive sleep apnea (adult) (pediatric): Principal | ICD-10-CM

## 2020-08-10 DIAGNOSIS — K219 Gastro-esophageal reflux disease without esophagitis: Principal | ICD-10-CM

## 2020-08-10 DIAGNOSIS — K5730 Diverticulosis of large intestine without perforation or abscess without bleeding: Principal | ICD-10-CM

## 2020-08-10 DIAGNOSIS — E669 Obesity, unspecified: Principal | ICD-10-CM

## 2020-08-10 DIAGNOSIS — Z800 Family history of malignant neoplasm of digestive organs: Principal | ICD-10-CM

## 2020-08-10 DIAGNOSIS — K625 Hemorrhage of anus and rectum: Principal | ICD-10-CM

## 2020-08-10 DIAGNOSIS — Z20822 Contact with and (suspected) exposure to covid-19: Principal | ICD-10-CM

## 2020-08-10 DIAGNOSIS — K644 Residual hemorrhoidal skin tags: Principal | ICD-10-CM

## 2020-08-10 DIAGNOSIS — C773 Secondary and unspecified malignant neoplasm of axilla and upper limb lymph nodes: Principal | ICD-10-CM

## 2020-08-10 DIAGNOSIS — Z170 Estrogen receptor positive status [ER+]: Principal | ICD-10-CM

## 2020-08-12 DIAGNOSIS — G4733 Obstructive sleep apnea (adult) (pediatric): Principal | ICD-10-CM

## 2020-08-12 DIAGNOSIS — K648 Other hemorrhoids: Principal | ICD-10-CM

## 2020-08-12 DIAGNOSIS — Z170 Estrogen receptor positive status [ER+]: Principal | ICD-10-CM

## 2020-08-12 DIAGNOSIS — K5730 Diverticulosis of large intestine without perforation or abscess without bleeding: Principal | ICD-10-CM

## 2020-08-12 DIAGNOSIS — K219 Gastro-esophageal reflux disease without esophagitis: Principal | ICD-10-CM

## 2020-08-12 DIAGNOSIS — R197 Diarrhea, unspecified: Principal | ICD-10-CM

## 2020-08-12 DIAGNOSIS — C50412 Malignant neoplasm of upper-outer quadrant of left female breast: Principal | ICD-10-CM

## 2020-08-12 DIAGNOSIS — Z6832 Body mass index (BMI) 32.0-32.9, adult: Principal | ICD-10-CM

## 2020-08-12 DIAGNOSIS — K644 Residual hemorrhoidal skin tags: Principal | ICD-10-CM

## 2020-08-12 DIAGNOSIS — Z803 Family history of malignant neoplasm of breast: Principal | ICD-10-CM

## 2020-08-12 DIAGNOSIS — Z20822 Contact with and (suspected) exposure to covid-19: Principal | ICD-10-CM

## 2020-08-12 DIAGNOSIS — F32A Depression, unspecified: Principal | ICD-10-CM

## 2020-08-12 DIAGNOSIS — E669 Obesity, unspecified: Principal | ICD-10-CM

## 2020-08-12 DIAGNOSIS — Z880 Allergy status to penicillin: Principal | ICD-10-CM

## 2020-08-12 DIAGNOSIS — C773 Secondary and unspecified malignant neoplasm of axilla and upper limb lymph nodes: Principal | ICD-10-CM

## 2020-08-12 DIAGNOSIS — Z800 Family history of malignant neoplasm of digestive organs: Principal | ICD-10-CM

## 2020-08-18 ENCOUNTER — Encounter: Admit: 2020-08-18 | Discharge: 2020-08-19 | Payer: PRIVATE HEALTH INSURANCE

## 2020-08-18 DIAGNOSIS — K625 Hemorrhage of anus and rectum: Principal | ICD-10-CM

## 2020-08-31 MED ORDER — EXEMESTANE 25 MG TABLET
ORAL_TABLET | Freq: Every day | ORAL | 11 refills | 30 days | Status: CP
Start: 2020-08-31 — End: ?

## 2021-01-11 NOTE — Unmapped (Signed)
Breast Oncology Follow-up Progress Note    PCP: Kurtis Bushman, MD   Breast Med/Onc: Marc Morgans, MD    Reason for Visit:  Management of breast cancer    Assessment/Plan:     A 65 y.o. woman with a h/o Left breast pT3pN2a, IDC, G2, ER+(95%)/PR+(30%)/HER2-neg, 5/6 LN positive, +LVI and positive surgical margins s/p BCT with targeted axillary LN bx on 07/03/17. Unfortunately her pathology revealed positive margins, and and she required a re-excision with ALND. Completed this on 08/14/17 showing 7 mm of residual tumor, negative margins, 2/23 +LN with larger tumor deposit of 6 mm. We had originally planned for ddAC-T, but given her H/O neuropathy we opted for q 2 week taxol. She experienced a hypersensitivity reaction into the 1st Taxol infusion, and was able to complete that dose at a slower rate.  Accordingly, she was switched to q3w Abraxane, which she tolerated better, and then followed by 4 cycles of AC. Her chemotherapy course was further complicated by a port-related thrombus, but she continued Eliquis for >2 months after treatment and then discontinued shortly after. She completed loco-regional XRT 04/05/18, and started letrozole 04/2018. Due to depression and arthralgias switched to Exemestane 08/2020 but has not started it due to high co-pay.     Presents today for follow up. She was supposed to start Exemestane in January but did not pick it up due to high co-pay. We will look into alternative medication options that are available on her new insurance plan and have pharmacist look into possible financial assistance. If she still have a high co-pay we can discuss switching to Anastrozole instead. She will follow up in 6 months with rad onc + repeat MMG. She will return to clinic in 1 year with a repeat DEXA. She will reach out in the interim if she has any questions or concerns.     Breast Imaging:  B/l MMG 07/23/20 BI-RADS 2 benign. Repeat in 1 year. R US breast: at area of patient's pain demonstrated no mass lesion, fluid collection, or abnormal shadowing.     Bone Health:  Most recent DEXA 04/2018: NBD. Repeat in 2023.     Supportive Care.   Left-sided Lymphedema: Persistent-controlled  Neuropathy: Chronic, predates chemo. On Cymbalta, B12. Encouraged daily exercise    DISPO:  --Begin Exemestane.  --RTC in 6 months with rad onc + repeat MMG   --RTC in 1 year with provider visit + DEXA   ____________________________________________________________________________________________________  Interval History:  Ms. Ports presents today accompanied by her husband for follow up on Exemestane. Since last visit on 08/31/20, she did not pick up Exemestane due to a high co-pay but recently switched insurances. - new job which she is enjoying.  --Doing well overall.  --Worsening neuropathy in legs, vagina, and rectum. Present in legs before cancer but worsening. This is a genetic neuropathy syndrome.  Following with neurologist. Taking Cymbalta and applying topical oil to feet which helps. Interested in acupuncture for potentially treating neuropathy.  --Feels better off letrozole, happier.   --Daily sweats, has not improved.  --No SOB.  --No vomiting.  --No new breast related complaints.  --Full 12 system ROS reviewed and otherwise mild/none.    --PS=1    Breast Cancer History:   Oncology History Overview Note   2018: Left breast cT2 N1a, IDC, G1, +/+/-.     Malignant neoplasm of upper-outer quadrant of left breast in female, estrogen receptor positive (CMS-HCC)   06/07/2017 -  Presenting Symptoms    Left  breast palpable UOQ mass     06/07/2017 Interval Scan(s)    Diagnostic MMG at [facility]: left breast UOQ mass difficult to measure, but appears to measure at least 5.6 cm.    [place]: left breast ill defined shadowing lesion at 2:00, 6 CFN measuring 3.7 x 2.6 x 2.5 cm.     Single mildly abnormal node in left axilla with constriction of fatty hilum.     06/13/2017 Biopsy    [facility] core biopsy of left breast 2:00, 6 CFN: IDC, G1, ER+(95%), PR+(30%), HER2 negative by FISH.  [facility] core biopsy left axillary LN: +LN metastasis.     06/13/2017 -  Other    Seen in [facility] ED following core biopsy in [facility] for axillary pain with non-expanding 4 cm hematoma noted in left axilla. No intervention required other than symptomatic measures (compression, pain management).     06/20/2017 Tumor Board    [facility] recs: left breast cT2 N1a IDC, G1, receptors pending. Per outside imaging there was a single abnormal LN found by [place] only. Awaiting receptors. Need SAVI into that clipped node. Discussed that she otherwise fits ACOSOG Z0011 criteria with negative clinical exam prior to biopsy and single abnormal LN on axillary [place]. Reasonable to do SLNB and excise biopsied node as well. If receptors favorable, consider surgery first approach, good BCT candidate. If high risk (TN, HER+) may need NACT approach. Briefly discussed ALTERNATE, but consensus was that she will likely be recommended for chemo given age and nodal positivity. Meeting surgery, rad/onc today. Will circle back with med/onc as needed.     07/03/2017 Surgery    Left [facility] localized/US guided partial mastectomy and left SLNbx with ALNDx: Invasive mammary carcinoma with mixed ductal and lobular features present in association with in situ carcinoma with mixed ductal and lobular features measuring 8.3 cm with positive inferior and lateral margin. Sentinel lymph node biopsy: 1/1 node positive for metastatic disease measuring 7 mm. No ECE. Palpable axillary lymph nodes: 4/4 lymph nodes with metastatic disease measuring 10 mm.  No ECE.  Additional palpable axillary lymph node 0/1 with isolated tumor cells. SAVI scout in the axilla was defective and there was no localization signal from the SAVI probe.     08/14/2017 Surgery    Left re-excision, ALND w ARM: Lateral margin: Invasive carcinoma with mixed ductal and lobular features measuring 7mm w LVI, margin negative.   Inferior margin: Multiple foci of lymphovasular invasion present.  No evidence of stromal invasion (final margin negative)  Left ALND: ??Two of twenty-three lymph nodes positive for carcinoma (2/23).  Size of larger tumor deposit: 6 mm.  Extracapsular extension: Absent     09/21/2017 -  Chemotherapy    Started ddTaxol--->ddAC     09/21/2017 Adverse Reaction    First Taxol infused for ~20 minutes resulting in flushing with chest tightness and nausea.  Infusion was stopped and administered.  Symptoms subsided within 20 minutes but she continued to feel some chest tightness with deep inspiration.  Albuterol nebulizer was given.  She then returned to baseline and infusion was restarted at 1/2 rate and titrated up slowly.  She was able to complete infusion without any further events.      10/05/2017 -  Chemotherapy    Switching to Abraxane due to infusion reaction     10/11/2017 Adverse Reaction    Acute DVT Rt interval jugular, brachiocephalic, subclavian vein-now on Eliquis 5 mg qd.      12/07/2017 - 02/07/2018 Chemotherapy  OP BREAST AC (DOSE DENSE)  DOXOrubicin 60 mg/m2, Cyclophosphamide 600 mg/m2 every 14 days      - 04/05/2018 Radiation    L breast and Marty, 50 Gy and 10 Gy boost     04/19/2018 Endocrine/Hormone Therapy    Started Letrozole    -- Switched to Exemestane 08/2020          Past Medical History:  Patient Active Problem List   Diagnosis   ??? Malignant neoplasm of upper-outer quadrant of left breast in female, estrogen receptor positive (CMS-HCC)   ??? Depression   ??? Peripheral polyneuropathy   ??? Acute idiopathic gout of left knee   ??? OSA treated with BiPAP   ??? GERD without esophagitis   ??? BRBPR (bright red blood per rectum)   ??? Paresthesia   ??? Abdominal cramping     Gyn History:   LMP 2012    Surgical History:   Past Surgical History:   Procedure Laterality Date   ??? ANAL FISSURECTOMY     ??? BREAST BIOPSY Left 06/2017    malignant   ??? BREAST BIOPSY Left 06/2017    Lympnode bx-positive   ??? BREAST LUMPECTOMY Left 06/2017   ??? CHEMOTHERAPY Left 2019   ??? IR INSERT PORT AGE GREATER THAN 5 YRS  09/28/2017    IR INSERT PORT AGE GREATER THAN 5 YRS 09/28/2017 Rush Barer, MD IMG VIR [facility]   ??? PR BREAST REDUCTION Right 06/14/2018    Procedure: REDUCTION MAMMOPLASTY FOR SYMMETRY PURPOSES;  Surgeon: Arsenio Katz, MD;  Location: ASC OR [facility];  Service: Plastics   ??? PR BX/REMV,LYMPH NODE,DEEP AXILL Left 07/03/2017    Procedure: BX/EXC LYMPH NODE; OPEN, DEEP AXILRY NODE;  Surgeon: Talbert Cage, DO;  Location: ASC OR [facility];  Service: Surgical Oncology   ??? PR COLONOSCOPY W/BIOPSY SINGLE/MULTIPLE N/A 08/12/2020    Procedure: COLONOSCOPY, FLEXIBLE, PROXIMAL TO SPLENIC FLEXURE; WITH BIOPSY, SINGLE OR MULTIPLE;  Surgeon: Vidal Schwalbe, MD;  Location: [facility];  Service: Gastroenterology   ??? PR INTRAOPERATIVE SENTINEL LYMPH NODE ID W DYE INJECTION Left 07/03/2017    Procedure: INTRAOPERATIVE IDENTIFICATION SENTINEL LYMPH NODE(S) INCLUDE INJECTION NON-RADIOACTIVE DYE, WHEN PERFORMED;  Surgeon: Talbert Cage, DO;  Location: ASC OR [facility];  Service: Surgical Oncology   ??? PR INTRAOPERATIVE SENTINEL LYMPH NODE ID W DYE INJECTION Left 08/14/2017    Procedure: INTRAOPERATIVE IDENTIFICATION SENTINEL LYMPH NODE(S) INCLUDE INJECTION NON-RADIOACTIVE DYE, WHEN PERFORMED;  Surgeon: Talbert Cage, DO;  Location: ASC OR [facility];  Service: Surgical Oncology Breast   ??? PR MASTECTOMY, PARTIAL Left 07/03/2017    Procedure: MASTECTOMY, savi scout PARTIAL (EG, LUMPECTOMY, TYLECTOMY, QUADRANTECTOMY, SEGMENTECTOMY);  Surgeon: Talbert Cage, DO;  Location: ASC OR [facility];  Service: Surgical Oncology   ??? PR MASTECTOMY, PARTIAL Left 08/14/2017    Procedure: MASTECTOMY, PARTIAL (EG, LUMPECTOMY, TYLECTOMY, QUADRANTECTOMY, SEGMENTECTOMY) Re-excision;  Surgeon: Talbert Cage, DO;  Location: ASC OR [facility];  Service: Surgical Oncology Breast   ??? PR REMOVE ARMPITS LYMPH NODES COMPLT Left 08/14/2017 Procedure: AXILLARY LYMPHADENECTOMY; COMPLETE;  Surgeon: Talbert Cage, DO;  Location: ASC OR [facility];  Service: Surgical Oncology Breast   ??? RADIATION Left 2019   ??? REDUCTION MAMMAPLASTY Right 06/14/2018     Medications:  Current Outpatient Medications   Medication Sig Dispense Refill   ??? acetaminophen (TYLENOL) 500 MG tablet Take 1,000 mg by mouth daily as needed for pain.      ??? docusate sodium (COLACE) 100 MG capsule Take 100 mg  by mouth daily.      ??? DULoxetine (CYMBALTA) 60 MG capsule Take 1 capsule (60 mg total) by mouth daily. 90 capsule 3   ??? famotidine (PEPCID) 20 MG tablet Take 20 mg by mouth every other day.      ??? polyethylene glycol (MIRALAX) 17 gram packet Take 17 g by mouth daily as needed.  0     No current facility-administered medications for this visit.     Allergies:  Allergies   Allergen Reactions   ??? Erythromycin Diarrhea   ??? Penicillins Rash     Personal and Social History:  Married.    Family History:  Cancer-related family history includes Breast cancer in her paternal aunt; Cancer in her paternal aunt; Colon cancer in her paternal aunt. There is no history of Ovarian cancer.    Physical Examination:   Vitals:    01/14/21 1000   BP: 142/81   Pulse: 99   Resp: 16   Temp: 36.6 ??C (97.8 ??F)   SpO2: 100%     GENERAL: Well appearing woman in no acute distress.  HEENT: Normocephalic, symmetric. EOMI. Sclera clear.  NECK: supple and non tender  LYMPH: no cervical, supraclavicular or axillary lymphadenopathy.  CHEST/BREASTS: Well healed incision, no evidence of locoregional recurrence. Some expected thickening between the incisions in the left UOQ. Remainder of the bilateral breasts without mass, skin change, retraction or nipple discharge.  No lymphedema noted.  LUNGS: respirations even and unlabored.clear to ausculation bilaterally.  CARDIAC: RRR, no murmurs.   ABDOMEN: Soft, nontender, no hepatomegaly or masses  SKIN: No rash, ecchymoses, purpuric lesions noted.  EXTREMITIES: No upper or lower extremity edema, normal skin tone.  MSK: Spine is nontender.  NEURO: Alert, oriented, normal gait &coordination.  ______________________________________________________________________    Documentation assistance was provided by Norwood Levo, Scribe, on January 14, 2021 at 10:00 AM for Erika Niece, MD     Documentation assistance provided by the Scribe. I was present during the time the encounter was recorded. The information recorded by the Scribe was done at my direction and has been reviewed and validated by me.  }

## 2021-01-14 ENCOUNTER — Encounter
Admit: 2021-01-14 | Discharge: 2021-01-15 | Payer: PRIVATE HEALTH INSURANCE | Attending: Medical Oncology | Primary: Medical Oncology

## 2021-01-14 DIAGNOSIS — C50412 Malignant neoplasm of upper-outer quadrant of left female breast: Principal | ICD-10-CM

## 2021-01-14 DIAGNOSIS — Z170 Estrogen receptor positive status [ER+]: Principal | ICD-10-CM

## 2021-01-14 DIAGNOSIS — C50912 Malignant neoplasm of unspecified site of left female breast: Principal | ICD-10-CM

## 2021-01-14 DIAGNOSIS — Z79811 Long term (current) use of aromatase inhibitors: Principal | ICD-10-CM

## 2021-01-14 MED ORDER — EXEMESTANE 25 MG TABLET
ORAL_TABLET | Freq: Every day | ORAL | 11 refills | 30 days | Status: CP
Start: 2021-01-14 — End: ?
  Filled 2021-01-25: qty 30, 30d supply, fill #0

## 2021-01-18 NOTE — Unmapped (Signed)
[facility] Specialty Medication Onboarding    Specialty Medication: exemestane  Prior Authorization: Not Required   Financial Assistance: No - patient declined financial assistance per MAPs referral  Final Copay/Day Supply: $35 / 30    Insurance Restrictions: None     Notes to Pharmacist: None    The triage team has completed the benefits investigation and has determined that the patient is able to fill this medication at [facility]. Please contact the patient to complete the onboarding or follow up with the prescribing physician as needed.

## 2021-01-24 NOTE — Unmapped (Signed)
[facility]   Patient Onboarding/Medication Counseling    Erika Cabrera is a 65 y.o. female with breast cancer who I am counseling today on initiation of therapy.  I am speaking to the patient.    Was a Nurse, learning disability used for this call? No    Verified patient's date of birth / HIPAA.    Specialty medication(s) to be sent: Hematology/Oncology: Exemestane 25mg       Non-specialty medications/supplies to be sent: n/a      Medications not needed at this time: n/a         Aromasin (Exemestane)    Medication & Administration     Dosage: 25mg  (1 tablets) by mouth once daily (about the same time every day)    Administration: This is an oral medication and is taken after a meal.  Swallow the tablet whole and do not break, crush, or chew the tablet.     Adherence/Missed dose instructions:  If you miss a dose, take it as soon as you remember that day.  If you miss a day, do not double your dose the next day. If you vomit after taking your dose, do not take another dose and take next dose at its normally scheduled time.      Goals of Therapy     Reduce the risk of recurrence    Side Effects & Monitoring Parameters   ??? Hot flashes  ??? Fatigue  ??? Decrease in bone density  ??? Muscle or joint pain/aches  ??? Headache  ??? Nausea/vomiting  ??? Vaginal dryness  ??? Hair thinning/loss    The following side effects should be reported to the provider:    ??? Heartbeat that doesn't feel normal (heart feels like it's racing, skipping a beat or fluttering)  ??? Decrease in urination, change in color of urine, blood in urine   ??? Yellowing of skin or eyes, your urine appears dark or brown or pain in abdomen  ??? Signs of allergic reaction (rash, hives, shortness of breath)  ??? Drastic mood changes      Contraindications, Warnings, & Precautions   ??? Do not take with a high fat meal (can increase medication levels in your blood)  ??? Exposure of an unborn child to this medication could cause birth defects so you should not become pregnant while on this medication.  Effective birth control should be used during treatment.  ??? Exemestane can cause decreased bone mineral density so your doctor may recommend calcium supplements, Vitamin D supplements or other medications to help prevent osteoporosis      Drug/Food Interactions     ??? Medication list reviewed in Epic. The patient was instructed to inform the care team before taking any new medications or supplements including over the counter medications, vitamins, and herbal supplements. No drug interactions identified.       Storage, Handling Precautions, & Disposal   ??? This medication should be stored at room temperature and in a dry location. Keep out of reach of others including children and pets. Keep the medicine in the original container with a child-proof top. Do not throw away or flush unused medication down the toilet or sink. This drug is considered hazardous and should be handled as little as possible.  If someone else helps with medication administration, they should wear gloves      Current Medications (including OTC/herbals), Comorbidities and Allergies     Current Outpatient Medications   Medication Sig Dispense Refill   ??? acetaminophen (TYLENOL) 500  MG tablet Take 1,000 mg by mouth daily as needed for pain.      ??? docusate sodium (COLACE) 100 MG capsule Take 100 mg by mouth daily.      ??? DULoxetine (CYMBALTA) 60 MG capsule Take 1 capsule (60 mg total) by mouth daily. 90 capsule 3   ??? exemestane (AROMASIN) 25 mg tablet Take 1 tablet (25 mg total) by mouth in the morning. 30 tablet 11   ??? famotidine (PEPCID) 20 MG tablet Take 20 mg by mouth every other day.      ??? polyethylene glycol (MIRALAX) 17 gram packet Take 17 g by mouth daily as needed.  0     No current facility-administered medications for this visit.       Allergies   Allergen Reactions   ??? Erythromycin Diarrhea   ??? Penicillins Rash       Patient Active Problem List   Diagnosis   ??? Malignant neoplasm of upper-outer quadrant of left breast in female, estrogen receptor positive (CMS-HCC)   ??? Depression   ??? Peripheral polyneuropathy   ??? Acute idiopathic gout of left knee   ??? OSA treated with BiPAP   ??? GERD without esophagitis   ??? BRBPR (bright red blood per rectum)   ??? Paresthesia   ??? Abdominal cramping       Reviewed and up to date in Epic.    Appropriateness of Therapy     Acute infections noted within Epic:  No active infections  Patient reported infection: None    Is medication and dose appropriate based on diagnosis and infection status? Yes    Prescription has been clinically reviewed: Yes      Baseline Quality of Life Assessment      How many days over the past month did your condition  keep you from your normal activities? For example, brushing your teeth or getting up in the morning. 0    Financial Information     Medication Assistance provided: None Required    Anticipated copay of $35 reviewed with patient. Verified delivery address.    Delivery Information     Scheduled delivery date: 01/26/21    Expected start date: 01/26/21    Medication will be delivered via UPS to the prescription address in [facility].  This shipment will not require a signature.      Explained the services we provide at [facility] and that each month we would call to set up refills.  Stressed importance of returning phone calls so that we could ensure they receive their medications in time each month.  Informed patient that we should be setting up refills 7-10 days prior to when they will run out of medication.  A pharmacist will reach out to perform a clinical assessment periodically.  Informed patient that a welcome packet, containing information about our pharmacy and other support services, a Notice of Privacy Practices, and a drug information handout will be sent.      The patient or caregiver noted above participated in the development of this care plan and knows that they can request review of or adjustments to the care plan at any time.      Patient or caregiver verbalized understanding of the above information as well as how to contact the pharmacy at [phone] option 4 with any questions/concerns.  The pharmacy is open Monday through Friday 8:30am-4:30pm.  A pharmacist is available 24/7 via pager to answer any clinical questions they may have.  Patient Specific Needs     - Does the patient have any physical, cognitive, or cultural barriers? No    - Does the patient have adequate living arrangements? (i.e. the ability to store and take their medication appropriately) Yes    - Did you identify any home environmental safety or security hazards? No    - Patient prefers to have medications discussed with  Patient     - Is the patient or caregiver able to read and understand education materials at a high school level or above? Yes    - Patient's primary language is  English     - Is the patient high risk? Yes, patient is taking oral chemotherapy. Appropriateness of therapy as been assessed    - Does the patient require physician intervention or other additional services (i.e. dietary/nutrition, smoking cessation, social work)? No      Florene Route [facility] Specialty Pharmacist

## 2021-01-28 NOTE — Unmapped (Signed)
LVM for Erika Cabrera, brief info only due to non-identified VM, that we are working on getting a med for her. Stated I would call her back today to try to connect as she does not have MyChart.

## 2021-02-21 NOTE — Unmapped (Signed)
[facility] Clinical Assessment & Refill Coordination Note    Erika Cabrera, DOB: [birth year]  Phone: [phone] (home) [phone] (work)    All above HIPAA information was verified with patient.     Was a Nurse, learning disability used for this call? No    Specialty Medication(s):   Hematology/Oncology: Exemestane 25mg      Current Outpatient Medications   Medication Sig Dispense Refill   ??? acetaminophen (TYLENOL) 500 MG tablet Take 1,000 mg by mouth daily as needed for pain.      ??? docusate sodium (COLACE) 100 MG capsule Take 100 mg by mouth daily.      ??? DULoxetine (CYMBALTA) 60 MG capsule Take 1 capsule (60 mg total) by mouth daily. 90 capsule 3   ??? exemestane (AROMASIN) 25 mg tablet Take 1 tablet (25 mg total) by mouth in the morning. 30 tablet 11   ??? famotidine (PEPCID) 20 MG tablet Take 20 mg by mouth every other day.      ??? polyethylene glycol (MIRALAX) 17 gram packet Take 17 g by mouth daily as needed.  0     No current facility-administered medications for this visit.        Changes to medications: Marena reports no changes at this time.    Allergies   Allergen Reactions   ??? Erythromycin Diarrhea   ??? Penicillins Rash       Changes to allergies: No    SPECIALTY MEDICATION ADHERENCE     Exemestane 25 mg: 6 days of medicine on hand     Medication Adherence    Patient reported X missed doses in the last month: 0  Specialty Medication: Exemestane  Patient is on additional specialty medications: No  Informant: patient  Confirmed plan for next specialty medication refill: delivery by pharmacy  Refills needed for supportive medications: not needed          Specialty medication(s) dose(s) confirmed: Regimen is correct and unchanged.     Are there any concerns with adherence? No    Adherence counseling provided? Not needed    CLINICAL MANAGEMENT AND INTERVENTION      Clinical Benefit Assessment:    Do you feel the medicine is effective or helping your condition? Yes    Clinical Benefit counseling provided? Not needed    Adverse Effects Assessment:    Are you experiencing any side effects? No    Are you experiencing difficulty administering your medicine? No    Quality of Life Assessment:    Quality of Life      Oncology  1.On a scale of 1-10, rate your pain and/or discomfort you've experienced within the last week.: 1  2.On a scale of 1-10, how has cancer symptoms and/or treatment side effects interfered with your ability to complete your normal daily activities within the last week?: 1  3.Within the last week, how would you rate your feeling of anxiousness and/or depression based on the following options?: None (1)                Have you discussed this with your provider? Not needed    Acute Infection Status:    Acute infections noted within Epic:  No active infections  Patient reported infection: None    Therapy Appropriateness:    Is therapy appropriate? Yes, therapy is appropriate and should be continued    DISEASE/MEDICATION-SPECIFIC INFORMATION      N/A    PATIENT SPECIFIC NEEDS     - Does the  patient have any physical, cognitive, or cultural barriers? No    - Is the patient high risk? Yes, patient is taking oral chemotherapy. Appropriateness of therapy as been assessed    - Does the patient require a Care Management Plan? No     - Does the patient require physician intervention or other additional services (i.e. nutrition, smoking cessation, social work)? No      SHIPPING     Specialty Medication(s) to be Shipped:   Hematology/Oncology: Exemestane 25mg     Other medication(s) to be shipped: No additional medications requested for fill at this time     Changes to insurance: No    Delivery Scheduled: Yes, Expected medication delivery date: 02/23/21.     Medication will be delivered via UPS to the confirmed prescription address in [facility].    The patient will receive a drug information handout for each medication shipped and additional FDA Medication Guides as required.  Verified that patient has previously received a Conservation officer, historic buildings and a Surveyor, mining.    The patient or caregiver noted above participated in the development of this care plan and knows that they can request review of or adjustments to the care plan at any time.      All of the patient's questions and concerns have been addressed.    Rollen Cabrera   [facility] Specialty Pharmacist

## 2021-02-23 ENCOUNTER — Encounter: Admit: 2021-02-23 | Discharge: 2021-02-24 | Payer: PRIVATE HEALTH INSURANCE

## 2021-02-23 DIAGNOSIS — Z833 Family history of diabetes mellitus: Principal | ICD-10-CM

## 2021-02-23 DIAGNOSIS — G4733 Obstructive sleep apnea (adult) (pediatric): Principal | ICD-10-CM

## 2021-02-23 DIAGNOSIS — Z01419 Encounter for gynecological examination (general) (routine) without abnormal findings: Principal | ICD-10-CM

## 2021-02-23 DIAGNOSIS — F3342 Major depressive disorder, recurrent, in full remission: Principal | ICD-10-CM

## 2021-02-23 DIAGNOSIS — Z9989 Dependence on other enabling machines and devices: Principal | ICD-10-CM

## 2021-02-23 DIAGNOSIS — G629 Polyneuropathy, unspecified: Principal | ICD-10-CM

## 2021-02-23 DIAGNOSIS — Z6832 Body mass index (BMI) 32.0-32.9, adult: Principal | ICD-10-CM

## 2021-02-23 DIAGNOSIS — Z131 Encounter for screening for diabetes mellitus: Principal | ICD-10-CM

## 2021-02-23 MED ORDER — DULOXETINE 60 MG CAPSULE,DELAYED RELEASE
ORAL_CAPSULE | Freq: Every day | ORAL | 3 refills | 90.00000 days | Status: CP
Start: 2021-02-23 — End: 2022-02-23

## 2021-02-23 NOTE — Unmapped (Signed)
[facility] at [facility]     Type of visit:  face to face    Reason for visit: fu    Questions / Concerns that need to be addressed:  Wants to discuss about sleep apnea an d rectal bleeding       General Consent to Treat (GCT) for non-epic video visits only: Verbal consent      Screening BP- 111/73      Allergies reviewed: Yes    Medication reviewed: Yes  Pended refills? Yes    HCDM reviewed and updated in Epic:    We are working to make sure all of our patients??? wishes are updated in Epic and part of that is documenting a Environmental health practitioner for each patient  A Health Care Decision Maker is someone you choose who can make health care decisions for you if you are not able - who would you most want to do this for you????  is already up to date.    HCDM (patient stated preference): Chovanec,Edward - Spouse - [phone]    HCDM, First AlternateToney Reil Sister - [phone]      BPAs completed:        COVID-19 Vaccine Summary  Which COVID-19 Vaccine was administered  Janssen  Type:  Janssen-Complete  Dates Given:  04/14/2020                   If no: Are you interested in scheduling?     Immunization History   Administered Date(s) Administered   ??? COVID-19 VACC,(JANSSEN)(PF)(IM) 04/14/2020   ??? Influenza Virus Vaccine, unspecified formulation 04/29/2012, 05/14/2013, 05/21/2017, 05/28/2017   ??? Td (adult) unspecified formulation 01/25/2005   ??? TdaP 09/12/2012       __________________________________________________________________________________________    SCREENINGS COMPLETED IN FLOWSHEETS    HARK Screening       AUDIT       PHQ2       PHQ9  Thoughts that you would be better off dead, or of hurting yourself in some way: Not at all  PHQ-9 TOTAL SCORE: 0    P4 Suicidality Screener                GAD7    Over the last 2 weeks, how often have you been bothered by the following problems?  Feeling nervous, anxious or on edge: Not at all  Not being able to stop or control worrying: Not at all  Worrying too much about different things: Not at all  Trouble relaxing: Not at all  Being so restless that it is hard to sit still: Not at all  Becoming easily annoyed or irritable: Not at all  Feeling afraid as if something awful might happen: Not at all  GAD-7 Total Score: 0    COPD Assessment       Falls Risk  Falls Risk  Have you fallen in the past year?: No  Do you feel unsteady when standing or walking?: (!) Yes

## 2021-02-23 NOTE — Unmapped (Signed)
[facility] - [facility]  Established Visit    Reason for Visit:  OSA, diabetes screening; pap smear    1. Family history of diabetes mellitus    2. Recurrent major depressive disorder, in full remission (CMS-HCC)    3. Peripheral polyneuropathy    4. Diabetes mellitus screening    5. Body mass index (BMI) 32.0-32.9, adult    6. Pap smear, low-risk    7. OSA on CPAP               Assessment/Plans:  1. Recurrent major depressive disorder, in full remission (CMS-HCC)  PHQ-9 PHQ-9 TOTAL SCORE   02/23/2021 0   12/17/2019 1   04/05/2018 1   well controlled on current regimen  - DULoxetine (CYMBALTA) 60 MG capsule; Take 1 capsule (60 mg total) by mouth in the morning.  Dispense: 90 capsule; Refill: 3    2. Peripheral polyneuropathy  Stable on current dose.  No changes  - DULoxetine (CYMBALTA) 60 MG capsule; Take 1 capsule (60 mg total) by mouth in the morning.  Dispense: 90 capsule; Refill: 3    3. Family history of diabetes mellitus  4. Diabetes mellitus screening  - Hemoglobin A1c  Random FBG while inpatient indicates possible pre-diabetes    5. Body mass index (BMI) 32.0-32.9, adult  Very interested in these services .  Interested in sugar free diet and regimen for her to follow.  Depending on A1c result would also focus on prediabetes  - Ambulatory referral to Nutrition Services; Future    6. Pap smear, low-risk  Last in 2018 but no HPV result found  - Pap Smear; Future with HPV  - Pap Smear    7. OSA  Has not been using BIPAP for several months due to poor fitting mask. Has been having issues with DME Company, [employer] and requests switch to other DME company in [place], [place], or [place] that can help troubleshoot issues  - sent request to CM pool for assistance    Return in about 6 months (around 08/26/2021) for In-person or Video Visit.     Medication adherence and barriers to the treatment plan have been addressed. Opportunities to optimize healthy behaviors have been discussed. Patient / caregiver voiced understanding.        __________________________________________________________    HPI:    [employer] [facility] with BiPAP. Paid, and check cleared, but was told she has not paid and getting late notices.  Unhappy with cheap mask she got and cannot tolerate wearing  Would like to use place in DME in [place], [place], or [place]    Not sure if she had HPV testing with last Pap smear and unsure of recommended time interval for repeat Pap at that time. LMP when she was 56 or 65yo. No vaginal discharge.  Not currently sexually active. Pain due to vaginal dryness has been the issue    Started new medication per Dr. Avis Epley  1 month ago and notices that she has been more irritable. Otherwise, tolerating OK    Diabetes in several siblings.  Checked BG when visiting sister and noted BG in 140s.        Medications:  Reviewed in EPIC      Physical Exam:   Vital Signs:   BP 111/73  - Pulse 87  - Temp 36.6 ??C (97.9 ??F) (Temporal)  - Resp 16  - Ht 170.2 cm (5' 7)  - Wt 93 kg (205 lb)  - LMP  (LMP  Unknown)  - SpO2 97%  - BMI 32.11 kg/m??   Gen: Well appearing, NAD  CV: RRR, no murmurs  Pulm: CTA bilaterally, no crackles or wheezes  GU:  Cervix normal appearing; dry vaginal mucosa. No pelvic masses appreciated on bimanual exam

## 2021-02-24 NOTE — Unmapped (Incomplete)
[facility]   CARE MANAGEMENT ENCOUNTER           Date of Service:  02/24/2021      Service:  Care Coordination - {ANP note - type:48216}  Is there someone else in the room? {televisitvisitor:66677}  MyChart use by patient is active: {yes/no:21137}    Purpose of contact:     Care Assistant (CA) received request from PCP to assist patient in finding a new DME company for BiPAP.      Care Assistant (CA) completed the following related to the above request:  ??? Reviewed chart  o Patient resides in [place], [place] [employer])  o Patient requests a new DME company in [place], [place], or [place]  ??? Identified resources ***  ??? Contacted Erika Cabrera and discussed the following 5 min   o Patient states she no longer wants to deal with [employer] because they are trying to double bill her. She has tried calling [employer] to fix the billing issue but it has not been resolved. They continue to bill her with late fees.   o Patient states last mask she received was cheap and flimsy. She could not get a proper fit which caused leaks.   o Informed patient that there are limited DME companies in her area that are not related to [employer]. Informed patient that [employer] is an [employer].   o Patient requested contact information and address for closest [employer] location so she can speak directly to them regarding billing.   o Patient requested   o Place [place], [place]   ??? ***  ??? Sent information to   ??? ***    Patient was encouraged to reach out to their provider with any questions or concerns.    ***Fill out any related SDOH Modules    Provider/Care Partner(s)  to follow up on:   {BLANK ZOXWRU:04540}    A copy of this Patient Outreach Encounter was sent to patient's Primary Care Provider       [facility]  [address], [address]  [place], [place] [address]  PHONE:  [phone]

## 2021-02-28 NOTE — Unmapped (Signed)
The admin team has completed this request. The patient has been reached and is scheduled for:  - VISIT TYPE: return   - DATE: 05/25/21  - TIME: 1:30 pm  - PROVIDER: Gladman  - ADDITIONAL NOTES:  Please schedule sooner follow-up with me in 3 months given her A1c result

## 2021-03-15 MED FILL — EXEMESTANE 25 MG TABLET: ORAL | 30 days supply | Qty: 30 | Fill #1

## 2021-03-18 NOTE — Unmapped (Signed)
[facility] Refill Coordination Note    Specialty Medication(s) to be Shipped:   Hematology/Oncology: Exemestane 25mg     Other medication(s) to be shipped: No additional medications requested for fill at this time     Erika Cabrera, DOB: Jul 11, 1956  Phone: [phone] (home) [phone] (work)      All above HIPAA information was verified with patient.     Was a Nurse, learning disability used for this call? No    Completed refill call assessment today to schedule patient's medication shipment from the [facility] [phone]).  All relevant notes have been reviewed.     Specialty medication(s) and dose(s) confirmed: Regimen is correct and unchanged.   Changes to medications: Erika Cabrera reports no changes at this time.  Changes to insurance: No  New side effects reported not previously addressed with a pharmacist or physician: None reported  Questions for the pharmacist: No    Confirmed patient received a Conservation officer, historic buildings and a Surveyor, mining with first shipment. The patient will receive a drug information handout for each medication shipped and additional FDA Medication Guides as required.       DISEASE/MEDICATION-SPECIFIC INFORMATION        N/A    SPECIALTY MEDICATION ADHERENCE     Medication Adherence    Patient reported X missed doses in the last month: 0  Specialty Medication: Exemestane 25mg   Patient is on additional specialty medications: No  Patient is on more than two specialty medications: No  Any gaps in refill history greater than 2 weeks in the last 3 months: no  Demonstrates understanding of importance of adherence: yes  Informant: patient  Reliability of informant: reliable  Provider-estimated medication adherence level: good  Patient is at risk for Non-Adherence: No  Reasons for non-adherence: no problems identified              Were doses missed due to medication being on hold? No       EXEMESTANE 25 mg: 7 days of medicine on hand         REFERRAL TO PHARMACIST Referral to the pharmacist: Not needed      [facility]     Shipping address confirmed in Epic.     Delivery Scheduled: Yes, Expected medication delivery date: 08/16.     Medication will be delivered via UPS to the prescription address in Epic WAM.    [name]   [facility] Specialty Technician

## 2021-03-21 MED FILL — EXEMESTANE 25 MG TABLET: ORAL | 30 days supply | Qty: 30 | Fill #2

## 2021-04-14 NOTE — Unmapped (Signed)
Patient denied refills for Exemestane at this current time, she is requesting a call back in two weeks.

## 2021-04-22 ENCOUNTER — Ambulatory Visit: Admit: 2021-04-22 | Discharge: 2021-04-23 | Payer: PRIVATE HEALTH INSURANCE

## 2021-04-22 DIAGNOSIS — M79675 Pain in left toe(s): Principal | ICD-10-CM

## 2021-04-22 NOTE — Unmapped (Addendum)
It was nice meeting you today! Here is the plan:    1) Try ibuprofen every 8 hours as needed to help with pain relief    2) Try voltaren gel liberally up to four times day in the area    3) We will try get left toe Xrays- though understand that we may find degenerative changes but that does not necessarily mean we will change treatment course for it. However, please let us know if things worsen.

## 2021-04-22 NOTE — Unmapped (Signed)
Reason for visit: gout    Subjective  HPI: Erika Cabrera is a 65 y.o. coming in with pain in 4th toe.     Pain started two days ago and woke patient up from bed. Nothing acutely happened that day or night to lead to the pain. Described as a nagging pain that happens every minute or so. Nothing makes it worse at this time. Tried ibuprofen once with no relief. Oils used for neuropathic pain also did not help with this pain at all. Had gout before in R knee during radiation. Does not feel like that pain. No kidney disease that patient is aware of. Denies eating or drinking rich foods. Able to ambulate.     ROS: No unexpected weight loss, fevers, chills, dizziness, lightheadedness, chest pain, SOB, diarrhea, lower extremity swelling, rash.    Objective  Vitals:    04/22/21 1526   BP: 125/86   Pulse: 89   Temp: 36.7 ??C (98 ??F)   SpO2: 97%        General: Well appearing, NAD  Cardio: NRR, no murmurs appreciated  Pulm:  CTA bilaterally, no crackles or wheezes  Extremities: no peripheral edema, 2+ pulses. High heel arches. No erythema, swelling, or warmth at site of pain or in feet. No tenderness to palpation at site of pain.  Neuro: Alert, oriented, no gross focal deficits    Assessment & Plan:    Pain of Left 4th toe: 65 yo F with longstanding history of neuropathic pain in feet coming for stabbing pain in L 4th toe that woke patient up from sleep that is no red, warm, or swollen. Gout was considered but due to the presentation, lower on the differential. Also discussed mortons neuroma however doesn't necessarily fit the location or type of pain either. Discussed trialing ibuprofen for longer in addition to voltaren gel but discussed that they may not help with this type of pain. Discussed getting a xray but also mentioned that it may find degenerative changes that don't necessarily relate to the toe.   - Trial ibuprofen up to three times a day  - Liberally apply voltaren gel to site of pain  - Toe Xrays to assess for changes.      Staffed with Dr. Brooke Dare; seen and discussed.    Stark Bray MD, PhD  Internal Medicine, PGY1

## 2021-04-22 NOTE — Unmapped (Signed)
[facility] at [facility]       Type of visit:  face to face    Reason for visit: possible gout left 4th toe for 2 days    Questions / Concerns that need to be addressed:     General Consent to Treat (GCT) for non-epic video visits only:           Hypertension:  ??? Have blood pressure cuff at home?: no  ??? Regularly checking blood pressure?: no  ??? If patient has a  record of recent blood pressures, please enter into flow sheets          [employer]ron BPs (complete if screening BP has a systolic  > 129 or diastolic > 79)  BP#1 125/89   BP#2 128/88  BP#3 122/82    Average BP 125/86    89          Allergies reviewed: Yes    Medication reviewed: Yes  Pended refills? No        HCDM reviewed and updated in Epic:    We are working to make sure all of our patients??? wishes are updated in Epic and part of that is documenting a Environmental health practitioner for each patient  A Health Care Decision Maker is someone you choose who can make health care decisions for you if you are not able - who would you most want to do this for you????  is already up to date.        BPAs completed:        COVID-19 Vaccine Summary  Which COVID-19 Vaccine was administered  Janssen  Type:  Dates Given:                   If no: Are you interested in scheduling?     Immunization History   Administered Date(s) Administered   ??? COVID-19 VACC,(JANSSEN)(PF)(IM) 04/14/2020   ??? Influenza Virus Vaccine, unspecified formulation 04/29/2012, 05/14/2013, 05/21/2017, 05/28/2017   ??? Td (adult) unspecified formulation 01/25/2005   ??? TdaP 09/12/2012       __________________________________________________________________________________________    SCREENINGS COMPLETED IN FLOWSHEETS    HARK Screening       AUDIT       PHQ2       PHQ9          P4 Suicidality Screener                GAD7       COPD Assessment       Falls Risk       .imcres

## 2021-04-23 NOTE — Unmapped (Signed)
I saw and evaluated the patient, participating in the key portions of the service.  I reviewed the resident’s note.  I agree with the resident’s findings and plan. Jordi Lacko R Nicha Hemann, MD

## 2021-05-12 ENCOUNTER — Ambulatory Visit: Admit: 2021-05-12 | Discharge: 2021-05-13 | Payer: PRIVATE HEALTH INSURANCE

## 2021-05-12 DIAGNOSIS — U071 COVID-19: Principal | ICD-10-CM

## 2021-05-12 MED ORDER — BENZONATATE 100 MG CAPSULE
ORAL_CAPSULE | Freq: Three times a day (TID) | ORAL | 0 refills | 7 days | Status: CP | PRN
Start: 2021-05-12 — End: 2021-05-19
  Filled 2021-05-12: qty 21, 7d supply, fill #0

## 2021-05-12 NOTE — Unmapped (Signed)
INTERNAL [facility]ICINE [facility] SAME DAY CLINIC NOTE     05/12/2021    PCP: Kurtis Bushman, MD     Assessment and Plan    Erika Cabrera was seen today for covid-19.    Diagnoses and all orders for this visit:    COVID-19  -     benzonatate (TESSALON PERLES) 100 MG capsule; Take 1 capsule (100 mg total) by mouth Three (3) times a day as needed for cough for up to 7 days.  -     RAPID INFLUENZA/RSV/COVID PCR    Patient out of the window for Paxlovid since >5 days from symptom onset. No alarming symptoms on exam and vital signs and O2 sat normal today in clinic. Will provided prescription for tessalon perles for symptomatic relief, repeat COVID PCR today for work documentation purposes, and encouraged hydration. Can use tylenol as needed for fever/HA. Return precautions provided for worsening SOB, etc.     Follow up as scheduled or sooner as needed.    Patient was discussed with Dr. Rockney Ghee who is in agreement with the assessment and plan as outlined above.       Subjective    Problem List:  Patient Active Problem List   Diagnosis   ??? Malignant neoplasm of upper-outer quadrant of left breast in female, estrogen receptor positive (CMS-HCC)   ??? Depression   ??? Peripheral polyneuropathy   ??? Acute idiopathic gout of left knee   ??? OSA treated with BiPAP   ??? GERD without esophagitis   ??? BRBPR (bright red blood per rectum)   ??? Paresthesia   ??? Abdominal cramping       HPI  Erika Cabrera is a 65 y.o. year old female with the above problem list who presents to the same day clinic for   Chief Complaint   Patient presents with   ??? Covid-19     (+) covid at home test      Patient developed symptoms on 9/28 including fever, chills, diarrhea, 1x episode of vomiting following a trip to [place]. She has had continued fever/chills, fatigue, cough and some mild tightness in her chest since that time. She will feel a little winded and light headed when walking around. Is eating and drinking okay but has a bit lower apetite. She has received one dose of J&J vaccine. No lower extremity swelling or pain.     Meds and allergies were reviewed in Epic    ROS: 10 point ROS was performed and is otherwise negative other than mentioned in the HPI    Objective  PE:  Vitals:    05/12/21 1514   BP: 122/88   Pulse: 99   Resp: 18   Temp: 37.1 ??C (98.8 ??F)   SpO2: 97%     General: tired appearing but otherwise in NAD  Eyes: EOMI, PERRL  ENT: OP clear w/o erythema or exudate  CV: RRR, no m/r/g  Resp: CTAB, no wheezes or crackles, normal WOB on RA  Skin: scattered healing scabs on arms and legs - patient notes from her kitten.   Ext: no cyanosis/clubbing/edema, no pain with palpation over posterior calves.  Neuro: CN II-XII intact. Strength 5/5 in UE and LE bilaterally. Negative romberg test. Normal gait.     Procedure: None  See procedure note from this encounter    _______________________________________________________________________________________    Same Day Metric Tracker:    Referral source for today's visit:  General Internal Medicine    Referral to other outpatient urgent  services? N/A    Did today's visit result in referral to ED or direct admission? No    Without today's visit, would this patient have required an ED visit or hospitalization? No

## 2021-05-12 NOTE — Unmapped (Signed)
Cough medication called Jerilynn Som sent to the pharmacy downstairs.     We will repeat a covid test today so you have it for work.     You should continue to isolate at home until your symptoms are resolving. You should be fever free for 48 hours (without any medications) before going out of the home. You should wear a mask around other people until your symptoms are improving or 10 days have passed since you first got symptoms.

## 2021-05-16 NOTE — Unmapped (Signed)
[facility] Pharm[facility] Refill Coordination Note    Specialty Medication(s) to be Shipped:   Hematology/Oncology: Exemestane 25mg     Other medication(s) to be shipped: No additional medications requested for fill at this time     Erika Cabrera, DOB: 1956/02/26  Phone: [phone] (home) [phone] (work)      All above HIPAA information was verified with patient.     Was a Nurse, learning disability used for this call? No    Completed refill call assessment today to schedule patient's medication shipment from the [facility] [phone]).  All relevant notes have been reviewed.     Specialty medication(s) and dose(s) confirmed: Regimen is correct and unchanged.   Changes to medications: Yer reports no changes at this time.  Changes to insurance: No  New side effects reported not previously addressed with a pharmacist or physician: None reported  Questions for the pharmacist: No    Confirmed patient received a Conservation officer, historic buildings and a Surveyor, mining with first shipment. The patient will receive a drug information handout for each medication shipped and additional FDA Medication Guides as required.       DISEASE/MEDICATION-SPECIFIC INFORMATION        N/A    SPECIALTY MEDICATION ADHERENCE     Medication Adherence    Patient reported X missed doses in the last month: 0  Specialty Medication: Exemestane 25 mg  Patient is on additional specialty medications: No  Informant: patient              Were doses missed due to medication being on hold? No    Exemestane 25 mg: 12 days of medicine on hand       REFERRAL TO PHARMACIST     Referral to the pharmacist: Not needed      [facility]     Shipping address confirmed in Epic.     Delivery Scheduled: Yes, Expected medication delivery date: 05/25/21.     Medication will be delivered via UPS to the prescription address in Epic WAM.    Jasper Loser   [facility] Specialty Technician

## 2021-05-17 NOTE — Unmapped (Signed)
I saw and evaluated the patient, participating in the key portions of the service.  I reviewed the resident???s note.  I agree with the resident???s findings and plan. Meredith Leeds, MD

## 2021-05-24 MED FILL — EXEMESTANE 25 MG TABLET: ORAL | 30 days supply | Qty: 30 | Fill #3

## 2021-05-25 ENCOUNTER — Ambulatory Visit: Admit: 2021-05-25 | Discharge: 2021-05-26 | Payer: PRIVATE HEALTH INSURANCE

## 2021-05-25 DIAGNOSIS — E119 Type 2 diabetes mellitus without complications: Principal | ICD-10-CM

## 2021-05-25 DIAGNOSIS — G4733 Obstructive sleep apnea (adult) (pediatric): Principal | ICD-10-CM

## 2021-05-25 MED ORDER — METFORMIN ER 500 MG TABLET,EXTENDED RELEASE 24 HR
ORAL_TABLET | Freq: Every day | ORAL | 3 refills | 90.00000 days | Status: CP
Start: 2021-05-25 — End: 2022-05-25

## 2021-05-25 NOTE — Unmapped (Signed)
She called and states that she needs an updated script for her bipap supplies so that she can make an appt to be fitted for bipap and to receive the supplies

## 2021-05-25 NOTE — Unmapped (Signed)
[facility]ron BPs   BP#1 133/88   BP#2 123/89  BP#3 127/89    Average BP 128/89  (please note this as a comment in vitals)

## 2021-05-25 NOTE — Unmapped (Signed)
[facility] Medicine [facility] - Faculty Practice  Established Visit    Reason for visit: recent COVID infection, new dx diabetes    A/P:           1. Type 2 diabetes mellitus without complication, without long-term current use of insulin (CMS-HCC)    2. OSA treated with BiPAP        1. Type 2 diabetes mellitus without complication, without long-term current use of insulin (CMS-HCC)  Lab Results   Component Value Date    A1C 6.9 (H) 02/23/2021   Diagnosed at last visit  - discussed multiple different treatment options.  Will start metformin XR 1000mg  daily  - Ambulatory referral to Nutrition Services; Future - desserts and carbs are major areas of improvement in her diet    2. OSA treated with BiPAP  Provided DME company info again for her to contact  - BIPAP Without Backup Rate with Integrated Humidifier; Future      Return in about 3 months (around 08/25/2021) for In-person.        __________________________________________________________    HPI:    Recovered from recent covid infection without complication  We discuss her A1c result at last visit.   Siblings with metformin.  Sister does not tolerate metformin well due to excessive diarrhea.  Brother has been able to get off metformin with diet and exercise.   States she didn't hear about scheduling with dietician. Has phone that blocks unknown numbers. Has bad sweet tooth.  Reports she eats a lot of sugar and carbs. Interested in working with dietician and feels like she just needs to be told exactly what to do to change her diet.   Willing to walk for exercise now that it's cooler and husband would like to do this with her.   Has not yet called DME company for BiPAP    __________________________________________________________        Medications:  Reviewed in EPIC  __________________________________________________________    Physical Exam:   Vital Signs:  Vitals:    05/25/21 1251   BP: 128/89   BP Site: R Arm   BP Position: Sitting   BP Cuff Size: Large   Pulse: 87   Temp: 36.1 ??C (97 ??F)   TempSrc: Temporal   SpO2: 97%   Weight: 92.7 kg (204 lb 6.4 oz)   Height: 170.2 cm (5' 7)          PTHomeBP    The patient???s Average Home Blood Pressure during the last two weeks is :    /         Gen: Well appearing, NAD  CV: RRR, no murmurs  Pulm: CTA bilaterally, no crackles or wheezes      PHQ-9 Score:     GAD-7 Score:       Medication adherence and barriers to the treatment plan have been addressed. Opportunities to optimize healthy behaviors have been discussed. Patient / caregiver voiced understanding.

## 2021-05-25 NOTE — Unmapped (Addendum)
[facility]  [address], [address]  [place], [place] [address]  Phone: [phone]    Medication changes:  - start metformin 1000mg  once a day    You will be contacted to schedule with our dietician. You can also call the main clinic number to schedule this: [phone]

## 2021-05-26 NOTE — Unmapped (Signed)
[facility] Internal Medicine   CARE MANAGEMENT ENCOUNTER           Date of Service:  05/26/2021      Service:  Care Coordination - mychart  MyChart use by patient is active: yes    Purpose of contact:     Care Assistant (CA) received a mychart message from patient requesting a BiPAP renewal order. Once placed, patient wants the order to be sent to [facility]. Patient also want an appointment with a respiratory therapist to fit the BiPAP mask.     Care Assistant (CA) completed the following related to the above request:  ??? Reviewed chart  o BiPAP order placed by PCP on 10/19  ??? Identified resources   ??? [facility] (P: [phone]) to request a fax number  o Fax: [phone]  ??? Faxed BiPAP order, patient demographics, and recent clinical note to [facility] (P: [phone], F: [phone])  o Fax confirmation sheet received   ??? Replied to patient's mychart message - Updated patient on order and encouraged them to call [facility] to schedule an appointment with a respiratory therapist     Patient was encouraged to reach out to their provider with any questions or concerns.    A copy of this Patient Outreach Encounter was sent to patient's Primary Care Provider

## 2021-05-27 NOTE — Unmapped (Signed)
Faxed to [facility] #[phone]

## 2021-06-13 ENCOUNTER — Telehealth
Admit: 2021-06-13 | Discharge: 2021-06-14 | Payer: PRIVATE HEALTH INSURANCE | Attending: Registered" | Primary: Registered"

## 2021-06-13 DIAGNOSIS — E119 Type 2 diabetes mellitus without complications: Principal | ICD-10-CM

## 2021-06-13 NOTE — Unmapped (Signed)
Patient Stated Health/Nutrition Goals:  Meet nutritional needs     Identified treatment goals: Not applicable    Nutrition Interventions (ADVISE/ASSIST):  1. Focus on breakfast: goal of 20 grams of protein   A. Healthy Fats: nuts, seeds, nut butters, avocado, olives, olive oil, avocado oil, vegetable oil

## 2021-06-13 NOTE — Unmapped (Signed)
Enhanced Care Nutrition-Initial Video Assessment    Nutrition Assessment:  Patient is a pleasant 65 y.o. who comes today desiring to improve her diabetes. Patient overall as pretty good control but she would like to reduce risks with better diet. We discussed areas what could be improved. Educated patient on reasons behind the recommendations.    Nutrition Diagnosis:  Overweight/obesity related to undesirable food choices as evidenced by low fiber intake, low protein intake in the morning    Patient Stated Health/Nutrition Goals:  Meet nutritional needs     Identified treatment goals: Not applicable    Nutrition Interventions (ADVISE/ASSIST):  1. Focus on breakfast: goal of 20 grams of protein   A. Healthy Fats: nuts, seeds, nut butters, avocado, olives, olive oil, avocado oil, vegetable oil    ______________________________________________________________________  Referring MD or Clinic:   Kimel-Scott, Rosalio Macadamia*  Kurtis Bushman, MD    Reason for Referral:   1. Type 2 diabetes mellitus without complication, without long-term current use of insulin (CMS-HCC)        Medical History:  Past Medical History:   Diagnosis Date   ??? Breast cancer (CMS-HCC) 06/2017    Left-Per bx results in [facility]   ??? Depression    ??? GERD (gastroesophageal reflux disease)    ??? Neuropathy     Since 20's.  Progressively worse.        Anthropometrics:    Present Weight   Current BMI     Goal Weight   180# Current IBW     Present Height         Weight History:  Wt Readings from Last 6 Encounters:   05/25/21 92.7 kg (204 lb 6.4 oz)   05/12/21 91.8 kg (202 lb 4.8 oz)   04/22/21 93.3 kg (205 lb 9.6 oz)   02/23/21 93 kg (205 lb)   01/14/21 94.8 kg (209 lb 1.6 oz)   08/12/20 94.3 kg (208 lb)       Medications, Herbs, Supplements:  Reviewed nutritionally relevant medications and supplements.    Recent Labs:   Hemoglobin A1C (%)   Date Value   02/23/2021 6.9 (H)   03/19/2020 6.3 (H)      No components found for: LDLCALC   BP Readings from Last 3 Encounters:   05/25/21 128/89   05/12/21 122/88   04/22/21 125/86     Lab Results   Component Value Date    CHOL 184 03/19/2020     Lab Results   Component Value Date    HDL 36 (L) 03/19/2020     Lab Results   Component Value Date    LDL 166.8 03/19/2020     No results found for: VLDL  No results found for: CHOLHDLRATIO  No results found for: TRIG      Physical Activity:  Walking but not consistent    Dietary Restrictions, Food Intolerances:  Spicy foods    Other GI Issues:  Heartburn: takes medication as needed  Constipation: but not so much recently    Allergies:   Allergies   Allergen Reactions   ??? Erythromycin Diarrhea   ??? Penicillins Rash       24-Hour recall/usual intake:  1st 2 slices of toast with butter, Egg Nog  2nd Brunch: Biscuit and gravy, scrambled eggs, mixed fruit, Diet Cran Pineapple juice  3rd [employer], water  Snacks None    Other Usual Intake:  1st 7:30-8 AM: 1 egg, 2 Tbsp grits, with Diet  Cran Grape  2nd Ham/Turkey sandwich  3rd Chili; Lasagna; Protein/starch/vegetable; out to eat but gets a salad  Snacks Fruit, Greek yogurt, chocolate  Beverages: water, Diet Cranberry juice  Eating out: 1-2 times a week  Cooking: Husband  Grocery shopping: Husband, Agricultural engineer shops at: [employer], [employer] benefits: None  Working Emergency planning/management officer, Engineer, maintenance (IT) available? Yes    Behavioral Risk Factors:  Emotional eating- Yes  Skipping meals- Yes  Grazing- Yes  Night eating- Night sweet after dinner  Fast Eating  Mostly likely  Overeating  No    Hunger and Satiety Issues:  Appetite: Sporadic    Patient Questions:  Education on Diabetes and eating    Estimated Needs:  Estimated Energy Needs: 1800-2100 kcal/day    Estimated Protein Needs: 75-85 gm/day    Materials Provided To Meet their identified goals:  List of recommendations    Food labels discussed: Yes    Expected Compliance/Barriers are:   Comprehension of plan good  Readiness for change good  Ability to meet goals good    We assessed family/social/cultural characteristics and these were relevant to care: none evident    Patient has auditory or visual communication barrier or need: no    Interventions Codes:  General/healthful diet     Follow-up (ARRANGE):  4 weeks      Length of visit was 45 minutes. Patient understands diagnosis and treatment plan. Patient voices understanding. Plan is consistent with the patient???s preferences and goals.    This visit is conducted via Programmer, applications.    Contact Information  Person Contacted: Patient  Contact Phone number: [phone] (home) [phone] (work)  Is there someone else in the room? No.         The patient reports they are currently: at home. I spent 45 minutes on the real-time audio and video with the patient on the date of service. I spent an additional 4 minutes on pre- and post-visit activities on the date of service.     The patient was physically located in [place] or a state in which I am permitted to provide care. The patient and/or parent/guardian understood that s/he may incur co-pays and cost sharing, and agreed to the telemedicine visit. The visit was reasonable and appropriate under the circumstances given the patient's presentation at the time.    The patient and/or parent/guardian has been advised of the potential risks and limitations of this mode of treatment (including, but not limited to, the absence of in-person examination) and has agreed to be treated using telemedicine. The patient's/patient's family's questions regarding telemedicine have been answered.     If the visit was completed in an ambulatory setting, the patient and/or parent/guardian has also been advised to contact their provider???s office for worsening conditions, and seek emergency medical treatment and/or call 911 if the patient deems either necessary.

## 2021-06-14 NOTE — Unmapped (Signed)
I was the supervising physician in the delivery of the service. Mychelle Kendra D Janeece Blok, MD

## 2021-06-21 NOTE — Unmapped (Signed)
[facility] Special[facility] Pharmacy Refill Coordination Note    Specialty Medication(s) to be Shipped:   Hematology/Oncology: Exemestane 25mg     Other medication(s) to be shipped: No additional medications requested for fill at this time     Erika Cabrera, DOB: May 20, 1956  Phone: [phone] (home) [phone] (work)      All above HIPAA information was verified with patient.     Was a Nurse, learning disability used for this call? No    Completed refill call assessment today to schedule patient's medication shipment from the [facility] [phone]).  All relevant notes have been reviewed.     Specialty medication(s) and dose(s) confirmed: Regimen is correct and unchanged.   Changes to medications: Erika Cabrera reports no changes at this time.  Changes to insurance: No  New side effects reported not previously addressed with a pharmacist or physician: None reported  Questions for the pharmacist: No    Confirmed patient received a Conservation officer, historic buildings and a Surveyor, mining with first shipment. The patient will receive a drug information handout for each medication shipped and additional FDA Medication Guides as required.       DISEASE/MEDICATION-SPECIFIC INFORMATION        N/A    SPECIALTY MEDICATION ADHERENCE     Medication Adherence    Patient reported X missed doses in the last month: 0  Specialty Medication: Exemestane 25mg   Patient is on additional specialty medications: No  Patient is on more than two specialty medications: No  Any gaps in refill history greater than 2 weeks in the last 3 months: no  Demonstrates understanding of importance of adherence: yes  Informant: patient              Were doses missed due to medication being on hold? No    Exemestane 25mg : Patient has 7 days of medication on hand    REFERRAL TO PHARMACIST     Referral to the pharmacist: Not needed      [facility]     Shipping address confirmed in Epic.     Delivery Scheduled: Yes, Expected medication delivery date: 11/18.     Medication will be delivered via UPS to the prescription address in Epic WAM.    Olga Millers   [facility] Specialty Technician

## 2021-06-23 MED FILL — EXEMESTANE 25 MG TABLET: ORAL | 30 days supply | Qty: 30 | Fill #4

## 2021-07-12 ENCOUNTER — Ambulatory Visit: Admit: 2021-07-12 | Payer: PRIVATE HEALTH INSURANCE | Attending: Registered" | Primary: Registered"

## 2021-07-15 NOTE — Unmapped (Signed)
Request call back after christmas

## 2021-07-27 NOTE — Unmapped (Unsigned)
Follow Up Visit Note    Patient Name: Erika Cabrera  Patient Age: 65 y.o.  Encounter Date: 07/29/2021    Referring Physician:   Cala Bradford, MD  [phone]  [facility]  [place],  [place] [address]    Primary Care Provider:  Kurtis Bushman, MD    Assessment:  Hx of L breast cancer, pT3N2a, HR +  Neuropathy  Bone Density    Follow up Plan:  Hx Breast cancer:  No clinical evidence of recurrence.  3D mmg ordered for next year.  I will see her in one year  F/U med onc in 6 mos- Dr Avis Epley.  Stopped Letrozole    Edema L axillary area:  Provided rx with names for [facility], she will call for this.       Neuropathy:  On Cymbalta, recent increase to 60 mg which works better  Neuropathy does predate chemo but is worse post chemo  Not interested other meds or Mri to evaluated pelvis to rule out sacral lesion        Bone Density:- 2019  IMPRESSION:  Lumbar spine: Normal bone density  Left proximal femur: Normal bone density  As stopped drug, did not order scan  ??  ??    Interval History:  Erika Cabrera returns today for follow up for hx of breast cancer, she is seen with her husband   She notes she never went back on Letrozole after stoppage in June and feels human now- so 21 mos of therapy in total.  She admits that she was to call Dr Avis Epley back for next steps and did not, she is very happy that she is feeling much better but on further conversation, she notes that her neuropathy is worse.    She also reports that she has numbness up to her pelvis such that she cannot really feel when she has a BM.  She now says she has had this for one year and has not worsened.  I discussed with her that I felt given that and despite presence for one year, with hx of cancer, I would like to consider MRI.  She has a Land with mostly OOP costs and likely does not want to pursue this but knows she can change her mind.  I did tell her that if this was a cancerous lesion, I would suspect there would be a worsening if its been present for one year    She is overall not happy with c/l mammoplasty (R) as she thinks the breast is still too large.  She reported some discomfort at the L breast today and this was ultrasounded without findings.  The discomfort is more lateral and she feels swelling    Does not wish to visit new AI.    She and spouse had J/J covid vaccine, they knew someone who died from covid and that convinced them.    Denies any other systemic concerns    Moved to [place] and very happy there, [place]      Oncology History Overview Note   2018: Left breast cT2 N1a, IDC, G1, +/+/-.     Malignant neoplasm of upper-outer quadrant of left breast in female, estrogen receptor positive (CMS-HCC)   06/07/2017 -  Presenting Symptoms    Left breast palpable UOQ mass     06/07/2017 Interval Scan(s)    Diagnostic MMG at [facility]: left breast UOQ mass difficult  to measure, but appears to measure at least 5.6 cm.    [place]: left breast ill defined shadowing lesion at 2:00, 6 CFN measuring 3.7 x 2.6 x 2.5 cm.     Single mildly abnormal node in left axilla with constriction of fatty hilum.     06/13/2017 Biopsy    [facility] core biopsy of left breast 2:00, 6 CFN: IDC, G1, ER+(95%), PR+(30%), HER2 negative by FISH.  [facility] core biopsy left axillary LN: +LN metastasis.     06/13/2017 -  Other    Seen in [facility] ED following core biopsy in [facility] for axillary pain with non-expanding 4 cm hematoma noted in left axilla. No intervention required other than symptomatic measures (compression, pain management).     06/20/2017 Tumor Board    [facility] recs: left breast cT2 N1a IDC, G1, receptors pending. Per outside imaging there was a single abnormal LN found by [place] only. Awaiting receptors. Need SAVI into that clipped node. Discussed that she otherwise fits ACOSOG Z0011 criteria with negative clinical exam prior to biopsy and single abnormal LN on axillary [place]. Reasonable to do SLNB and excise biopsied node as well. If receptors favorable, consider surgery first approach, good BCT candidate. If high risk (TN, HER+) may need NACT approach. Briefly discussed ALTERNATE, but consensus was that she will likely be recommended for chemo given age and nodal positivity. Meeting surgery, rad/onc today. Will circle back with med/onc as needed.     07/03/2017 Surgery    Left [facility] localized/US guided partial mastectomy and left SLNbx with ALNDx: Invasive mammary carcinoma with mixed ductal and lobular features present in association with in situ carcinoma with mixed ductal and lobular features measuring 8.3 cm with positive inferior and lateral margin. Sentinel lymph node biopsy: 1/1 node positive for metastatic disease measuring 7 mm. No ECE. Palpable axillary lymph nodes: 4/4 lymph nodes with metastatic disease measuring 10 mm.  No ECE.  Additional palpable axillary lymph node 0/1 with isolated tumor cells. SAVI scout in the axilla was defective and there was no localization signal from the SAVI probe.     08/14/2017 Surgery    Left re-excision, ALND w ARM: Lateral margin: Invasive carcinoma with mixed ductal and lobular features measuring 7mm w LVI, margin negative.   Inferior margin: Multiple foci of lymphovasular invasion present.  No evidence of stromal invasion (final margin negative)  Left ALND: ??Two of twenty-three lymph nodes positive for carcinoma (2/23).  Size of larger tumor deposit: 6 mm.  Extracapsular extension: Absent     09/21/2017 -  Chemotherapy    Started ddTaxol--->ddAC     09/21/2017 Adverse Reaction    First Taxol infused for ~20 minutes resulting in flushing with chest tightness and nausea.  Infusion was stopped and administered.  Symptoms subsided within 20 minutes but she continued to feel some chest tightness with deep inspiration.  Albuterol nebulizer was given.  She then returned to baseline and infusion was restarted at 1/2 rate and titrated up slowly.  She was able to complete infusion without any further events.      10/05/2017 -  Chemotherapy    Switching to Abraxane due to infusion reaction     10/11/2017 Adverse Reaction    Acute DVT Rt interval jugular, brachiocephalic, subclavian vein-now on Eliquis 5 mg qd.      12/07/2017 - 02/07/2018 Chemotherapy    OP BREAST AC (DOSE DENSE)  DOXOrubicin 60 mg/m2, Cyclophosphamide 600 mg/m2 every 14 days      - 04/05/2018  Radiation    L breast and [place], 50 Gy and 10 Gy boost     04/19/2018 Endocrine/Hormone Therapy    Started Letrozole; but discontinued 08/2020 due to depression and arthralgias.     -- Switched to Exemestane 08/2020 but did not start it due to high co-pay.        Letrozole    06/14/18  R reduction mammoplasty.  Breast tissue, right, reduction mammoplasty  - Fibrocystic changes with sclerosing adenosis, changes consistent with chemotherapy effect on benign breast tissue, and associated microcalcifications  - Skin with no specific pathologic abnormality      ROS:  10 organs reviewed and any positives above, rest of the 10 denied.     Physical Examination:  VITAL SIGNS: LMP  (LMP Unknown)   CONSTITUTIONAL:  Healthy-appearing female in no acute distress, present with husband  HEENT: Hair looks nice, can see some thinning atop head but hair that is present, hides this a bit, glasses, mask in place, anicteric  NECK:  Supple  HEME/LYMPH/IMMUNO:  No palpable cervical, supraclav or axillary LAN.  CV:  SI S2 RRR.     RESPIRATORY:  Chest clear to auscultation.   GASTROINTESTINAL:  Abdomen soft without masses palpable  MUSCULOSKELETAL:  No bony pain or tenderness on spine or SI joint.   BREASTS:  Bilateral breast examination with hyperpigmentation L breast but good skin turgor, soft scars, minor divot.  R breast well healed scars.  No palpable concerns in either breast. - no changed  SKIN:  No anterior chest lesions.   PSYCHIATRIC:  Mood is happy  EXTREMITIES:   L breast with some edema lateral breast. .   NEUROLOGIC:  Oriented x 3.   Sensation decreased L leg compared to R from mid shin up to pelvis.      Karnofsky/Lansky Performance Status:  80, Normal activity with effort; some signs or symptoms of disease (ECOG equivalent 1)- neuropathy    MMG B/L  Today:  birads 2, recommended for one year

## 2021-07-29 ENCOUNTER — Ambulatory Visit: Admit: 2021-07-29 | Discharge: 2021-07-30 | Payer: PRIVATE HEALTH INSURANCE

## 2021-07-29 DIAGNOSIS — Z170 Estrogen receptor positive status [ER+]: Principal | ICD-10-CM

## 2021-07-29 DIAGNOSIS — C50412 Malignant neoplasm of upper-outer quadrant of left female breast: Principal | ICD-10-CM

## 2021-07-29 DIAGNOSIS — Z1231 Encounter for screening mammogram for malignant neoplasm of breast: Principal | ICD-10-CM

## 2021-08-05 DIAGNOSIS — E119 Type 2 diabetes mellitus without complications: Principal | ICD-10-CM

## 2021-08-05 NOTE — Unmapped (Signed)
[facility] Clinical Assessment & Refill Coordination Note    Erika Cabrera, DOB: [birth year]  Phone: [phone] (home) [phone] (work)    All above HIPAA information was verified with patient.     Was a Nurse, learning disability used for this call? No    Specialty Medication(s):   Hematology/Oncology: Exemestane 25mg      Current Outpatient Medications   Medication Sig Dispense Refill   ??? acetaminophen (TYLENOL) 500 MG tablet Take 1,000 mg by mouth daily as needed for pain.      ??? docusate sodium (COLACE) 100 MG capsule Take 100 mg by mouth daily.      ??? DULoxetine (CYMBALTA) 60 MG capsule Take 1 capsule (60 mg total) by mouth in the morning. 90 capsule 3   ??? exemestane (AROMASIN) 25 mg tablet Take 1 tablet (25 mg total) by mouth in the morning. 30 tablet 11   ??? famotidine (PEPCID) 20 MG tablet Take 20 mg by mouth every other day.      ??? metFORMIN (GLUCOPHAGE-XR) 500 MG 24 hr tablet Take 2 tablets (1,000 mg total) by mouth daily before breakfast. 180 tablet 3   ??? polyethylene glycol (MIRALAX) 17 gram packet Take 17 g by mouth daily as needed.  0     No current facility-administered medications for this visit.        Changes to medications: Erika Cabrera reports no changes at this time.    Allergies   Allergen Reactions   ??? Erythromycin Diarrhea   ??? Penicillins Rash       Changes to allergies: No    SPECIALTY MEDICATION ADHERENCE     Exemestane 25 mg: 14 days of medicine on hand     Medication Adherence    Patient reported X missed doses in the last month: 0  Specialty Medication: exemestane 250mg           Specialty medication(s) dose(s) confirmed: Regimen is correct and unchanged.     Are there any concerns with adherence? No    Adherence counseling provided? Not needed    CLINICAL MANAGEMENT AND INTERVENTION      Clinical Benefit Assessment:    Do you feel the medicine is effective or helping your condition? Yes    Clinical Benefit counseling provided? Not needed    Adverse Effects Assessment:    Are you experiencing any side effects? No    Are you experiencing difficulty administering your medicine? No    Quality of Life Assessment:    Quality of Life      Oncology  1. What impact has your specialty medication had on the reduction of your daily pain or discomfort level?: None  2. On a scale of 1-10, how would you rate your ability to manage side effects associated with your specialty medication? (1=no issues, 10 = unable to take medication due to side effects): 1            How many days over the past month did your condition/medication  keep you from your normal activities? For example, brushing your teeth or getting up in the morning. 0    Have you discussed this with your provider? Not needed    Acute Infection Status:    Acute infections noted within Epic:  No active infections  Patient reported infection: None    Therapy Appropriateness:    Is therapy appropriate and patient progressing towards therapeutic goals? Yes, therapy is appropriate and should be continued    DISEASE/MEDICATION-SPECIFIC INFORMATION  N/A    PATIENT SPECIFIC NEEDS     - Does the patient have any physical, cognitive, or cultural barriers? No    - Is the patient high risk? Yes, patient is taking oral chemotherapy. Appropriateness of therapy as been assessed    - Does the patient require a Care Management Plan? No     SOCIAL DETERMINANTS OF HEALTH     At the [facility], we have learned that life circumstances - like trouble affording food, housing, utilities, or transportation can affect the health of many of our patients.   That is why we wanted to ask: are you currently experiencing any life circumstances that are negatively impacting your health and/or quality of life? No    Social Determinants of Health     Food Insecurity: No Food Insecurity   ??? Worried About Programme researcher, broadcasting/film/video in the Last Year: Never true   ??? Ran Out of Food in the Last Year: Never true   Tobacco Use: Low Risk    ??? Smoking Tobacco Use: Never   ??? Smokeless Tobacco Use: Never   ??? Passive Exposure: Not on file   Transportation Needs: No Transportation Needs   ??? Lack of Transportation (Medical): No   ??? Lack of Transportation (Non-Medical): No   Alcohol Use: Not At Risk   ??? How often do you have a drink containing alcohol?: Never   ??? How many drinks containing alcohol do you have on a typical day when you are drinking?: 1 - 2   ??? How often do you have 5 or more drinks on one occasion?: Never   Housing/Utilities: Low Risk    ??? Within the past 12 months, have you ever stayed: outside, in a car, in a tent, in an overnight shelter, or temporarily in someone else's home (i.e. couch-surfing)?: No   ??? Are you worried about losing your housing?: No   ??? Within the past 12 months, have you been unable to get utilities (heat, electricity) when it was really needed?: No   Substance Use: Not on file   Financial Resource Strain: Low Risk    ??? Difficulty of Paying Living Expenses: Not hard at all   Physical Activity: Not on file   Health Literacy: Not on file   Stress: Not on file   Intimate Partner Violence: Not At Risk   ??? Fear of Current or Ex-Partner: No   ??? Emotionally Abused: No   ??? Physically Abused: No   ??? Sexually Abused: No   Depression: Not on file   Social Connections: Not on file       Would you be willing to receive help with any of the needs that you have identified today? No       SHIPPING     Specialty Medication(s) to be Shipped:   Hematology/Oncology: Exemestane 25mg     Other medication(s) to be shipped: No additional medications requested for fill at this time     Changes to insurance: No    Delivery Scheduled: Yes, Expected medication delivery date: 08/12/21.     Medication will be delivered via UPS to the confirmed prescription address in [facility].    The patient will receive a drug information handout for each medication shipped and additional FDA Medication Guides as required.  Verified that patient has previously received a Conservation officer, historic buildings and a Surveyor, mining.    The patient or caregiver noted above participated in the development of this care plan  and knows that they can request review of or adjustments to the care plan at any time.      All of the patient's questions and concerns have been addressed.    [name]   [facility] Shared [facility] Specialty Pharmacist

## 2021-08-05 NOTE — Unmapped (Signed)
Other Call    ??? Caller name: patient   ??? Best callback number:   ??? Describe the reason for the call: I called to reschedule her appt to due Bump process/reschedule report.  ???   ??? Patient needs an afternoon appt but nothing available, so I offered a video appt but this appt needs to be done in person per appt notes, so sending a message to PCP to see if order for labs is an option and patient can come to get labs during the afternoon  ???   ???  Please advice if okay for pt to have a video visit and if PCP can place order     ??? PCP: Kurtis Bushman, MD  ??? Last encounter in department: 05/25/2021

## 2021-08-06 NOTE — Unmapped (Signed)
Addended by: Talbert Forest D on: 08/05/2021 05:28 PM     Modules accepted: Orders

## 2021-08-09 NOTE — Unmapped (Signed)
Patient informed. 

## 2021-08-11 MED FILL — EXEMESTANE 25 MG TABLET: ORAL | 30 days supply | Qty: 30 | Fill #5

## 2021-08-12 NOTE — Unmapped (Signed)
Symptom Complaints    ??? Caller name:Niccole  ??? Best callback number: [phone]  ??? Describe the symptom(s): chest pressure headach and wheezing runny nose  o When did this start?:1 week  o Are you having these symptoms right now?:yes  o How severe is it on a scale from 1 to 10?: 5    ??? PAC - is this a red flag symptom? (Reference list): no    ??? PCP: Kurtis Bushman, MD  ??? Last encounter in department: 05/25/2021

## 2021-08-12 NOTE — Unmapped (Signed)
Called pt , she reports  headache, chills, runny nose, productive cough with white sputum x 1 week. She states her chest is tight and has some wheezes. She has not taken a Covid test. Pt is requesting prescription from PCP.  Advised pt we have no appointments available today in our clinic.Recommended she goes to urgent care to be seen now. She declines and wants to set up appointment next week here at [facility]. Appointment scheduled for Monday 08/15/21 SDC. Strongly advised pt to reconsider going to urgent care now or emergency department if her symptoms worsen. She states she will go if she starts to feel worse.

## 2021-08-22 ENCOUNTER — Ambulatory Visit: Admit: 2021-08-22 | Discharge: 2021-08-23 | Payer: PRIVATE HEALTH INSURANCE

## 2021-08-22 DIAGNOSIS — E119 Type 2 diabetes mellitus without complications: Principal | ICD-10-CM

## 2021-08-22 LAB — BASIC METABOLIC PANEL
ANION GAP: 9 mmol/L (ref 5–14)
BLOOD UREA NITROGEN: 8 mg/dL — ABNORMAL LOW (ref 9–23)
BUN / CREAT RATIO: 10
CALCIUM: 10.5 mg/dL — ABNORMAL HIGH (ref 8.7–10.4)
CHLORIDE: 106 mmol/L (ref 98–107)
CO2: 28 mmol/L (ref 20.0–31.0)
CREATININE: 0.79 mg/dL
EGFR CKD-EPI (2021) FEMALE: 84 mL/min/{1.73_m2} (ref >=60)
GLUCOSE RANDOM: 91 mg/dL (ref 70–179)
POTASSIUM: 4.4 mmol/L (ref 3.4–4.8)
SODIUM: 143 mmol/L (ref 135–145)

## 2021-08-22 LAB — LIPID PANEL
CHOLESTEROL/HDL RATIO SCREEN: 5.1 — ABNORMAL HIGH (ref 1.0–4.5)
CHOLESTEROL: 172 mg/dL (ref <=200)
HDL CHOLESTEROL: 34 mg/dL — ABNORMAL LOW (ref 40–60)
LDL CHOLESTEROL CALCULATED: 118 mg/dL — ABNORMAL HIGH (ref 40–99)
NON-HDL CHOLESTEROL: 138 mg/dL — ABNORMAL HIGH (ref 70–130)
TRIGLYCERIDES: 102 mg/dL (ref 0–150)
VLDL CHOLESTEROL CAL: 20.4 mg/dL (ref 11–41)

## 2021-08-22 LAB — HEMOGLOBIN A1C
ESTIMATED AVERAGE GLUCOSE: 126 mg/dL
HEMOGLOBIN A1C: 6 % — ABNORMAL HIGH (ref 4.8–5.6)

## 2021-08-30 ENCOUNTER — Telehealth: Admit: 2021-08-30 | Discharge: 2021-08-31 | Payer: PRIVATE HEALTH INSURANCE

## 2021-08-30 DIAGNOSIS — C50412 Malignant neoplasm of upper-outer quadrant of left female breast: Principal | ICD-10-CM

## 2021-08-30 DIAGNOSIS — E782 Mixed hyperlipidemia: Principal | ICD-10-CM

## 2021-08-30 DIAGNOSIS — Z170 Estrogen receptor positive status [ER+]: Principal | ICD-10-CM

## 2021-08-30 DIAGNOSIS — E119 Type 2 diabetes mellitus without complications: Principal | ICD-10-CM

## 2021-08-30 NOTE — Unmapped (Signed)
[facility] at [facility]     Type of visit: video    Are you located in [place]? (for virtual visits only) Yes    Reason for visit: Follow up    Questions / Concerns that need to be addressed: none      PTHomeBP       HCDM reviewed and updated in Epic:    We are working to make sure all of our patients??? wishes are updated in Epic and part of that is documenting a Environmental health practitioner for each patient  A Health Care Decision Maker is someone you choose who can make health care decisions for you if you are not able - who would you most want to do this for you????  is already up to date.    HCDM (patient stated preference): Dennie,Edward - Spouse - [phone]    HCDM, First AlternateToney Reil Sister - [phone]    BPAs completed:  PHQ2  GAD7    COVID-19 Vaccine Summary  Which COVID-19 Vaccine was administered  Janssen  Type:  Dates Given:     Date last COVID Positive Test:   05/12/2021               If no: Are you interested in scheduling?     Immunization History   Administered Date(s) Administered   ??? COVID-19 VACC,(JANSSEN)(PF) 04/14/2020   ??? Influenza Virus Vaccine, unspecified formulation 04/29/2012, 05/14/2013, 05/21/2017, 05/28/2017   ??? Td (adult) unspecified formulation 01/25/2005   ??? TdaP 09/12/2012       __________________________________________________________________________________________    SCREENINGS COMPLETED IN FLOWSHEETS    HARK Screening       AUDIT       PHQ2  PHQ-2 Total Score : 0    PHQ9          P4 Suicidality Screener                GAD7    Over the last 2 weeks, how often have you been bothered by the following problems?  Feeling nervous, anxious or on edge: Not at all  Not being able to stop or control worrying: Not at all  Worrying too much about different things: Not at all  Trouble relaxing: Not at all  Being so restless that it is hard to sit still: Not at all  Becoming easily annoyed or irritable: Not at all  Feeling afraid as if something awful might happen: Not at all  GAD-7 Total Score: 0    COPD Assessment       Falls Risk

## 2021-08-30 NOTE — Unmapped (Signed)
We talked about a statin medication to lower cholesterol and to protect from heart disease. I included some information. Also talk to your siblings about whether they have taken a statin medication and their experiences.     We talked about benefits of fish oil (omega-3-fatty acids) and you can take a fish oil supplement.     We decided to continue metformin. I will recheck the hemoglobin A1c at your next visit.  If it is < 6% then I would recommend stopping the metformin

## 2021-08-30 NOTE — Unmapped (Signed)
[facility]  Established Patient - Video Visit    This visit is conducted via video conferencing.    Contact Information  Person Contacted: Patient  Contact Phone number: [phone] (home) [phone] (work)  Is there someone else in the room? No.   Patient agreed to a video visit    Erika Cabrera is a 66 y.o. female  participating in a video visit.    Reason for visit:  diabetes    Subjective:  Eating better.  Cut back on sweets significantly   Talked with RD once. Had another one scheduled, but county [place] power and was not able to schedule another visit    Started metformin. Has intermittent diarrhea, but nothing that is disruptive to her.     We discuss statin therapy given her diabetes and current cholesterol levels    I have reviewed the problem list, medications, and allergies and have updated/reconciled them if needed.    Objective:  As part of this Video Visit, no in-person exam was conducted.  Video interaction permitted the following observations.    GEN: No acute distress.   SKIN: Color is normal. No rashes.  PSYCH: Appropriate affect, normal mood  RESP: Normal work of breathing, no retractions      Assessment & Plan:  1. Type 2 diabetes mellitus without complication, without long-term current use of insulin (CMS-HCC)    2. Hyperlipidemia, mixed    3. Malignant neoplasm of upper-outer quadrant of left breast in female, estrogen receptor positive (CMS-HCC)      1. Type 2 diabetes mellitus without complication, without long-term current use of insulin (CMS-HCC)  Lab Results   Component Value Date    A1C 6.0 (H) 08/22/2021   Goal <7%, at goal today  Retinopathy:  due for retinal screen  Statin: no; reason for no statin:  other: patient declines at this time  ASA:  isn't  Continue current regimen:           2. Hyperlipidemia, mixed  LDL remains elevated above goal. Discussed statin therapy, but she declines at this time given concern for side effects and taking more medication. Provide patient education material and suggested that she discuss with siblings their experiences  Will revisit at next appt    3. Malignant neoplasm of upper-outer quadrant of left breast in female, estrogen receptor positive (CMS-HCC)  Follows with [facility]. No clinical evidence of recurrence  Continue exemestane.   Continue f/u with Dr. Avis Epley              Return in about 6 months (around 02/27/2022) for In-person; also schedule f/u with Dorien Chihuahua.             The patient reports they are currently: at home. I spent 13 minutes on the real-time audio and video with the patient on the date of service. I spent an additional 6 minutes on pre- and post-visit activities on the date of service.     The patient was physically located in [place] or a state in which I am permitted to provide care. The patient and/or parent/guardian understood that s/he may incur co-pays and cost sharing, and agreed to the telemedicine visit. The visit was reasonable and appropriate under the circumstances given the patient's presentation at the time.    The patient and/or parent/guardian has been advised of the potential risks and limitations of this mode of treatment (including, but not limited to, the absence of in-person examination) and has  agreed to be treated using telemedicine. The patient's/patient's family's questions regarding telemedicine have been answered.     If the visit was completed in an ambulatory setting, the patient and/or parent/guardian has also been advised to contact their provider???s office for worsening conditions, and seek emergency medical treatment and/or call 911 if the patient deems either necessary.

## 2021-09-03 NOTE — Unmapped (Signed)
[facility] Refill Coordination Note    Specialty Medication(s) to be Shipped:   Hematology/Oncology: Exemestane 25mg     Other medication(s) to be shipped: No additional medications requested for fill at this time     Erika Cabrera, DOB: January 25, 1956  Phone: [phone] (home) [phone] (work)      All above HIPAA information was verified with patient.     Was a Nurse, learning disability used for this call? No    Completed refill call assessment today to schedule patient's medication shipment from the [facility] [phone]).  All relevant notes have been reviewed.     Specialty medication(s) and dose(s) confirmed: Regimen is correct and unchanged.   Changes to medications: [name] reports no changes at this time.  Changes to insurance: No  New side effects reported not previously addressed with a pharmacist or physician: None reported  Questions for the pharmacist: No    Confirmed patient received a Conservation officer, historic buildings and a Surveyor, mining with first shipment. The patient will receive a drug information handout for each medication shipped and additional FDA Medication Guides as required.       DISEASE/MEDICATION-SPECIFIC INFORMATION        N/A    SPECIALTY MEDICATION ADHERENCE     Medication Adherence    Patient reported X missed doses in the last month: 0  Specialty Medication: exemestane 25 mg tablet (AROMASIN)  Patient is on additional specialty medications: No  Informant: patient              Were doses missed due to medication being on hold? No    Exemestane 25 mg: 16 days of medicine on hand       REFERRAL TO PHARMACIST     Referral to the pharmacist: Not needed      [facility]     Shipping address confirmed in Epic.     Delivery Scheduled: Yes, Expected medication delivery date: 09/16/21.     Medication will be delivered via UPS to the prescription address in Epic W[facility].    Erika Cabrera   [facility] Specialty Technician

## 2021-09-15 MED FILL — EXEMESTANE 25 MG TABLET: ORAL | 30 days supply | Qty: 30 | Fill #6

## 2021-09-20 ENCOUNTER — Ambulatory Visit: Admit: 2021-09-20 | Payer: PRIVATE HEALTH INSURANCE | Attending: Registered" | Primary: Registered"

## 2021-09-20 NOTE — Unmapped (Signed)
complete

## 2021-09-27 DIAGNOSIS — Z79811 Long term (current) use of aromatase inhibitors: Principal | ICD-10-CM

## 2021-10-13 ENCOUNTER — Telehealth
Admit: 2021-10-13 | Discharge: 2021-10-14 | Payer: PRIVATE HEALTH INSURANCE | Attending: Registered" | Primary: Registered"

## 2021-10-13 NOTE — Unmapped (Unsigned)
Enhanced Care Nutrition-Follow Up Video Assessment    Nutrition Assessment:  Patient comes today doing overall well. She reports needing to refocus on her intake again. We discussed ideas for her to start to focus on.    Nutrition Diagnosis:  Overweight/obesity related to undesirable food choices as evidenced by low fiber intake, low protein intake in the morning    Patient Stated Health/Nutrition Goals:  Meet nutritional needs     Identified treatment goals: Not applicable    Nutrition Interventions (ADVISE/ASSIST):  1. Refocus on breakfast: goal of 20 grams of protein  2. Meal Ideas:   A. Protein: Cottage cheese, [place] yogurt, chicken/chicken salad, tuna/tuna salad, beans/lentils/quinoa, cheese   B. Carbohydrates: fruit, whole grain cracker, whole grain bread, brown rice   C. Healthy fat: nuts, seeds, olives, olive oil, avocados   D. Vegetable       ______________________________________________________________________  Referring MD or Clinic:   Kimel-Scott, Rosalio Macadamia*  Kurtis Bushman, MD    Reason for Referral:   1. Overweight (BMI 25.0-29.9)        Medical History:  Past Medical History:   Diagnosis Date   ??? Breast cancer (CMS-HCC) 06/2017    Left-Per bx results in [facility]   ??? Depression    ??? GERD (gastroesophageal reflux disease)    ??? Neuropathy     Since 20's.  Progressively worse.        Anthropometrics:    Present Weight   Current BMI     Goal Weight   180# Current IBW     Present Height         Weight History:  Wt Readings from Last 6 Encounters:   08/30/21 85.3 kg (188 lb)   07/29/21 90.7 kg (200 lb)   05/25/21 92.7 kg (204 lb 6.4 oz)   05/12/21 91.8 kg (202 lb 4.8 oz)   04/22/21 93.3 kg (205 lb 9.6 oz)   02/23/21 93 kg (205 lb)       Medications, Herbs, Supplements:  Reviewed nutritionally relevant medications and supplements.    Recent Labs:   Hemoglobin A1C (%)   Date Value   08/22/2021 6.0 (H)   02/23/2021 6.9 (H)   03/19/2020 6.3 (H)      No components found for: LDLCALC   BP Readings from Last 3 Encounters:   05/25/21 128/89   05/12/21 122/88   04/22/21 125/86     Lab Results   Component Value Date    CHOL 172 08/22/2021    CHOL 184 03/19/2020     Lab Results   Component Value Date    HDL 34 (L) 08/22/2021    HDL 36 (L) 03/19/2020     Lab Results   Component Value Date    LDL 118 (H) 08/22/2021    LDL 166.8 03/19/2020     Lab Results   Component Value Date    VLDL 20.4 08/22/2021     Lab Results   Component Value Date    CHOLHDLRATIO 5.1 (H) 08/22/2021     Lab Results   Component Value Date    TRIG 102 08/22/2021       Physical Activity:  Walking but not consistent    Dietary Restrictions, Food Intolerances:  Spicy foods    Other GI Issues:  Heartburn: takes medication as needed  Constipation: but not so much recently    Allergies:   Allergies   Allergen Reactions   ??? Erythromycin Diarrhea   ??? Penicillins Rash  24-Hour recall/usual intake:  1st Diet Cran-Pineapple juice, egg, slice of toast  2nd Tuna casserole, water  3rd [employer], water  Snacks Funny Bones, Smart popcorn    Other Usual Intake:  1st 7:30-8 AM: 1 egg, 2 Tbsp grits, with Diet Cran Grape  2nd Ham/Turkey sandwich  3rd Chili; Lasagna; Protein/starch/vegetable; out to eat but gets a salad  Snacks Fruit, [place] yogurt, chocolate  Beverages: water, Diet Cranberry juice  Eating out: 1-2 times a week  Cooking: Husband  Grocery shopping: Husband, Agricultural engineer shops at: [employer], [employer] benefits: None  Working Emergency planning/management officer, Engineer, maintenance (IT) available? Yes    Behavioral Risk Factors:  Emotional eating- Yes  Skipping meals- Yes  Grazing- Yes  Night eating- Night sweet after dinner  Fast Eating  Mostly likely  Overeating  No    Hunger and Satiety Issues:  Appetite: Sporadic    Patient Questions:  Afternoon ideas    Estimated Needs:  Estimated Energy Needs: 1800-2100 kcal/day    Estimated Protein Needs: 75-85 gm/day    Materials Provided To Meet their identified goals:  List of recommendations    Food labels discussed: Yes    Expected Compliance/Barriers are:   Comprehension of plan good  Readiness for change good  Ability to meet goals good    We assessed family/social/cultural characteristics and these were relevant to care: none evident    Patient has auditory or visual communication barrier or need: no    Interventions Codes:  General/healthful diet     Follow-up (ARRANGE):  4 weeks      Length of visit was 30 minutes. Patient understands diagnosis and treatment plan. Patient voices understanding. Plan is consistent with the patient???s preferences and goals.    This visit is conducted via Programmer, applications.    Contact Information  Person Contacted: Patient  Contact Phone number: [phone] (home) [phone] (work)  Is there someone else in the room? No.         The patient reports they are currently: at home. I spent 30 minutes on the real-time audio and video with the patient on the date of service. I spent an additional 4 minutes on pre- and post-visit activities on the date of service.     The patient was physically located in [place] or a state in which I am permitted to provide care. The patient and/or parent/guardian understood that s/he may incur co-pays and cost sharing, and agreed to the telemedicine visit. The visit was reasonable and appropriate under the circumstances given the patient's presentation at the time.    The patient and/or parent/guardian has been advised of the potential risks and limitations of this mode of treatment (including, but not limited to, the absence of in-person examination) and has agreed to be treated using telemedicine. The patient's/patient's family's questions regarding telemedicine have been answered.     If the visit was completed in an ambulatory setting, the patient and/or parent/guardian has also been advised to contact their provider???s office for worsening conditions, and seek emergency medical treatment and/or call 911 if the patient deems either necessary. ambulatory setting, the patient and/or parent/guardian has also been advised to contact their provider???s office for worsening conditions, and seek emergency medical treatment and/or call 911 if the patient deems either necessary.

## 2021-10-15 NOTE — Unmapped (Signed)
Patient Stated Health/Nutrition Goals:  Meet nutritional needs     Identified treatment goals: Not applicable    Nutrition Interventions (ADVISE/ASSIST):  1. Refocus on breakfast: goal of 20 grams of protein  2. Meal Ideas:   A. Protein: Cottage cheese, [place] yogurt, chicken/chicken salad, tuna/tuna salad, beans/lentils/quinoa, cheese   B. Carbohydrates: fruit, whole grain cracker, whole grain bread, brown rice   C. Healthy fat: nuts, seeds, olives, olive oil, avocados   D. Vegetable

## 2021-10-24 NOTE — Unmapped (Signed)
The [facility] has made a third and final attempt to reach this patient to refill the following medication:Exemestane 25mg .      We have left voicemails on the following phone numbers: [phone] and have sent a MyChart message.    Dates contacted: 03/06, 03/13, 03/20  Last scheduled delivery: 09/15/21 for 30 day supply    The patient may be at risk of non-compliance with this medication. The patient should call the [facility] at [phone]  Option 4, then Option 1 (oncology) to refill medication.    Jasper Loser   [facility] Specialty Technician

## 2021-10-24 NOTE — Unmapped (Signed)
Pt  States  That  She  Has  At least a  Month of medication  On  Hand  -  Routing to  [facility]  Due to  Pt missing  Three  Doses in  The  Last  Month

## 2021-11-22 NOTE — Unmapped (Addendum)
The patient returned call regarding voicemail that was left, she states that she didn't need a refill right now for Exemestane 25 mg tablet (AROMASIN). That she had at least a month's supply left & would like a call back a month from now.

## 2021-11-25 ENCOUNTER — Ambulatory Visit: Admit: 2021-11-25 | Discharge: 2021-11-26 | Payer: PRIVATE HEALTH INSURANCE

## 2021-11-25 DIAGNOSIS — S93601A Unspecified sprain of right foot, initial encounter: Principal | ICD-10-CM

## 2021-11-25 DIAGNOSIS — B351 Tinea unguium: Principal | ICD-10-CM

## 2021-11-25 DIAGNOSIS — R2241 Localized swelling, mass and lump, right lower limb: Principal | ICD-10-CM

## 2021-11-25 MED ORDER — CICLOPIROX 8 % TOPICAL SOLUTION
Freq: Every evening | TOPICAL | 2 refills | 0 days | Status: CP
Start: 2021-11-25 — End: 2022-02-23

## 2021-11-25 NOTE — Unmapped (Signed)
[facility] at [facility]     Type of visit: face to face    Are you located in [place]? (for virtual visits only) N/A    Reason for visit: Follow up    Questions / Concerns that need to be addressed: Larey Seat on Wednesday    Screening BP- 134/80        HCDM reviewed and updated in Epic:    We are working to make sure all of our patients??? wishes are updated in Epic and part of that is documenting a Environmental health practitioner for each patient  A Health Care Decision Maker is someone you choose who can make health care decisions for you if you are not able - who would you most want to do this for you????  is already up to date.    HCDM (patient stated preference): Carbonell,Edward - Spouse - [phone]    HCDM, First AlternateToney Reil Sister - [phone]    BPAs completed:  Diabetes - Foot Exam  Diabetes - urine albumin/creatine ratio    COVID-19 Vaccine Summary  Which COVID-19 Vaccine was administered  Janssen  Type:  Dates Given:     Date last COVID Positive Test:   05/12/2021                 Immunization History   Administered Date(s) Administered    COVID-19 VACC,(JANSSEN)(PF) 04/14/2020    Influenza Virus Vaccine, unspecified formulation 04/29/2012, 05/14/2013, 05/21/2017, 05/28/2017    Td (adult) unspecified formulation 01/25/2005    TdaP 09/12/2012       __________________________________________________________________________________________    SCREENINGS COMPLETED IN FLOWSHEETS    HARK Screening       AUDIT       PHQ2       PHQ9          P4 Suicidality Screener                GAD7       COPD Assessment       Falls Risk

## 2021-11-25 NOTE — Unmapped (Signed)
Thank you for choosing [facility]!  We appreciate the opportunity to participate in your care. Please let us know if we can be of assistance your orthopaedic issues in the future.     If you have questions or concerns, please do not hesitate to contact us by [facility] MyChart or by calling [phone] to speak with one of our clinical support sports team members.     [place] MyChart Website: [email or web address]      Appointment Scheduling: [phone]    Walk-in hours:        [facility]:  Monday - Friday 8 am - 4 pm  [address]  2nd Floor, [address]  [place], [place]  [id number]    Over the counter pain treatment:  Use Extra Strength Tylenol (acetaminophen) 500mg  - take 2 tabs (1,000mg ) every 8 hours as needed for pain.  Max is 3,000mg  per day.     Salonpas 4% lidocaine patch, Therma Care patches, Tiger Balm, BioFreeze, IcyHot, Aspercreme, [place] GrangeBengay, Myoflex, Capzacin-HP/Capsaicin, Sportscreme, TENS unit, Blue Emu    Nonsteroidal Anti-Inflammatory Drugs (NSAIDs): Care Instructions  Your Care Instructions  Nonsteroidal anti-inflammatory drugs (NSAIDs) relieve pain and fever. You also can use them to reduce swelling and inflammation.  Over-the-counter NSAIDs include:  Ibuprofen (Advil, Motrin) 800 every 8 hours for 2 weeks          OR  Naproxen (Aleve) 500 mg every 12 hours for 2 weeks

## 2021-11-25 NOTE — Unmapped (Signed)
[facility] Visit                                   Assessment: Erika Cabrera is a 66 y.o. female with right ankle sprain after injury on 11/23/2021.    Imaging: 3 views right ankle weightbearing obtained today show grossly anatomic alignment with no obvious fractures or dislocations.  Grossly well-maintained ankle mortise.    Plan:  - I reviewed imaging and discussed the nature and mechanics of the diagnoses with patient. Treatment options discussed in detail.  Patient understands it may take up to 6 weeks for her symptoms to fully resolve and may continue to have some stiffness for several months.    -Weightbearing as tolerated with lace up ankle brace as needed.  May gradually increase physical activity and decrease use of ankle brace as tolerated based on pain level.    -Provided handout with home treatment recommendations for ankle sprains.    - Continue ice/heat, compression, elevation, OTC pain medication, activity modification as needed.     - Patient states understanding and agrees with above plan and was instructed to call for an appointment or go to the emergency department if there is any increase in pain or concerns. All questions were answered.     DME ORDER:  Dx: Z61.096E, Sprain of anterior talofibular ligament of right ankle, initial encounter  Dispense at Surgery: No  Location: [employer] Location: Foot and Ankle  Foot and Ankle: Lace-Up Brace  Laterality: Right  Print for Patient: No  Weightbearing: Full  Sizing: 10    DME Lower Extremity, The patient was prescribed this orthosis The patient is ambulatory but has weakness and/or instability of their lower extremity which requires stabilization from this semi-rigid/ rigid orthosis to improve their function.      Follow up: Return for Follow up with Asa Lente as needed. Marland Kitchen    History of present illness:  Erika Cabrera is a 66 y.o. female presenting to [facility] clinic with her husband for evaluation of right ankle injury.  Reports tripping and rolling the ankle on 11/23/2021.  She had immediate pain and swelling to the right ankle with difficulty weightbearing.  Today reports that she treated with ice, elevation, over-the-counter pain medication but continues to have swelling to the anterior lateral aspect of the ankle with pain when weightbearing.  She has a recently diagnosed diabetic and has history of peripheral neuropathy.  Does not smoke.  No history of fracture or surgery to the right ankle.    Review of Systems  .   Marland Kitchen   Medical History Past Medical History:   Diagnosis Date    Breast cancer (CMS-HCC) 06/2017    Left-Per bx results in [facility]    Depression     GERD (gastroesophageal reflux disease)     Neuropathy     Since 20's.  Progressively worse.       Surgical History Past Surgical History:   Procedure Laterality Date    ANAL FISSURECTOMY      BREAST BIOPSY Left 06/2017    malignant    BREAST BIOPSY Left 06/2017    Lympnode bx-positive    BREAST LUMPECTOMY Left 06/2017    CHEMOTHERAPY Left 2019    IR INSERT PORT AGE GREATER THAN 5 YRS  09/28/2017    IR INSERT PORT AGE GREATER THAN 5 YRS 09/28/2017 Rush Barer, MD IMG  VIR [facility]    PR BREAST REDUCTION Right 06/14/2018    Procedure: REDUCTION MAMMOPLASTY FOR SYMMETRY PURPOSES;  Surgeon: Arsenio Katz, MD;  Location: ASC OR [facility];  Service: Plastics    PR BX/REMV,LYMPH NODE,DEEP AXILL Left 07/03/2017    Procedure: BX/EXC LYMPH NODE; OPEN, DEEP AXILRY NODE;  Surgeon: Talbert Cage, DO;  Location: ASC OR [facility];  Service: Surgical Oncology    PR COLONOSCOPY W/BIOPSY SINGLE/MULTIPLE N/A 08/12/2020    Procedure: COLONOSCOPY, FLEXIBLE, PROXIMAL TO SPLENIC FLEXURE; WITH BIOPSY, SINGLE OR MULTIPLE;  Surgeon: Vidal Schwalbe, MD;  Location: GI [facility];  Service: Gastroenterology    PR INTRAOPERATIVE SENTINEL LYMPH NODE ID W DYE INJECTION Left 07/03/2017    Procedure: INTRAOPERATIVE IDENTIFICATION SENTINEL LYMPH NODE(S) INCLUDE INJECTION NON-RADIOACTIVE DYE, WHEN PERFORMED;  Surgeon: Talbert Cage, DO;  Location: ASC OR [facility];  Service: Surgical Oncology    PR INTRAOPERATIVE SENTINEL LYMPH NODE ID W DYE INJECTION Left 08/14/2017    Procedure: INTRAOPERATIVE IDENTIFICATION SENTINEL LYMPH NODE(S) INCLUDE INJECTION NON-RADIOACTIVE DYE, WHEN PERFORMED;  Surgeon: Talbert Cage, DO;  Location: ASC OR [facility];  Service: Surgical Oncology Breast    PR MASTECTOMY, PARTIAL Left 07/03/2017    Procedure: MASTECTOMY, savi scout PARTIAL (EG, LUMPECTOMY, TYLECTOMY, QUADRANTECTOMY, SEGMENTECTOMY);  Surgeon: Talbert Cage, DO;  Location: ASC OR [facility];  Service: Surgical Oncology    PR MASTECTOMY, PARTIAL Left 08/14/2017    Procedure: MASTECTOMY, PARTIAL (EG, LUMPECTOMY, TYLECTOMY, QUADRANTECTOMY, SEGMENTECTOMY) Re-excision;  Surgeon: Talbert Cage, DO;  Location: ASC OR [facility];  Service: Surgical Oncology Breast    PR REMOVE ARMPITS LYMPH NODES COMPLT Left 08/14/2017    Procedure: AXILLARY LYMPHADENECTOMY; COMPLETE;  Surgeon: Talbert Cage, DO;  Location: ASC OR [facility];  Service: Surgical Oncology Breast    RADIATION Left 2019    REDUCTION MAMMAPLASTY Right 06/14/2018      Allergies Erythromycin and Penicillins   Medications She has a current medication list which includes the following prescription(s): acetaminophen, ciclopirox, docusate sodium, duloxetine, exemestane, famotidine, metformin, and polyethylene glycol.   Family History {Her family history includes Breast cancer in her paternal aunt; Cancer in her paternal aunt; Colon cancer in her paternal aunt; No Known Problems in her daughter, father, maternal grandfather, maternal grandmother, mother, paternal grandfather, paternal grandmother, sister, and another family member.   Social History She reports that she has never smoked. She has never used smokeless tobacco. She reports that she does not drink alcohol and does not use drugs.Home [address]  [place] [address]  Occupation:         Occupational History    Not on file     Social History     Social History Narrative    The patient is married. She lives at home with her husband, Ed.  She has two children            Physical examination: General: Well developed, well nourished pleasant female in no acute distress who is A&O x 3. There is no height or weight on file to calculate BMI.    Head: Normocephalic, atraumatic  Chest: Normal chest excursion with respiration  Respiratory: Normal respiratory effort. No respiratory distress  Skin: no rashes or abrasions or lesions    Right ankle: Ambulates independently with an antalgic gait.  No obvious deformities or dislocations.  Skin is intact without rashes, abrasions, or open wounds.  Swelling and tenderness over the anterolateral aspect of the ankle.  Tenderness at the ATFL.  No medial or lateral  malleoli or tenderness.  Calf is soft and nontender.  Range of motion of the ankle is limited secondary to pain and swelling.  No gross instability with anterior drawer testing.  Neurovascular intact throughout lower extremity with brisk capillary refill.      Patient note was created using Scientist, clinical (histocompatibility and immunogenetics). Errors in syntax or grammar may not have been identified and edited on initial review.       MEDICAL DECISION MAKING (level of service defined by 2/3 elements)     Number/Complexity of Problems Addressed 1 acute, uncomplicated illness or injury (99203/99213)   Amount/Complexity of Data to be Reviewed/Analyzed 2 points: Review prior notes (1 point per unique source); Review test results (1 point per unique test); Order tests (1 point per unique test) (99203/99213)   Risk of Complications/Morbidity/Mortality of Management Over-the-counter Medications (99203/99213)

## 2021-11-25 NOTE — Unmapped (Signed)
Take Ibuprofen 600mg  every 8 hours for the next 2 weeks,   Elevate the foot, Ice 20 minutes on, 20 mins off     [facility]  [address]  [place], [address]  Phone: [phone]  Toll Free: [phone]  Fax: [phone]    Contacts for urgent needs:    During Business Hours:   Same Day Clinic:  sick today and need an appointment, call [phone], ask for an appointment in the Same Day Clinic, which is located on the 5th floor at [address] [place] [address]  Nurse advice line:  call main clinic number [phone], option to speak with nurse.  Voicemails are check throughout the day and messages left by 4:30pm will be responded to that day.  After 4:30pm, messages are returned the next business day.    After Hours, Weekend, or Holidays:  Call the [employer] [facility] [employer] 24/7 Nursing Line [phone] to get nurse advice.  Go to [facility] ([facility]) walk-in clinic at [address], [place], [place]; [phone]; 7 days a week from 9:00AM - 8:00PM.  Go to [facility] at [facility] at [address], [address], [place], [place] [address]; [phone]; 7 days a week from 8:00PM - 8:00PM  Go to [facility] at [facility] at [address], [place], [place]; [phone]; Mon-Fri 8:00AM-7:00PM, Sat-Sun 12:00PM-5:00PM  Go to [facility]gent Care at [facility] at [address] [place], [place] [address], [phone], 7 days a week from 8:00AM - 8:00PM  Go to [facility] for sprains and strains, joint pain, sports injuries and possible fractures at [address], [place], [place]; [phone]; Mon-Thurs 8:00AM-7:00PM, Fri 8:00AM-5:00PM    [facility]gent Care website with full listings:  [email or web address]     Scheduling appointments Online  [facility] MyChart secure website and app makes it easy for you to schedule appointments. You can schedule most primary care clinic appointments online with providers you have seen before. Log in to [email or web address] and select Visits to schedule an appointment today

## 2021-11-25 NOTE — Unmapped (Signed)
[facility] - Faculty Practice  Internal Medicine Clinic Visit    Reason for visit: foot injury    A/P:           1. Localized swelling of right foot    2. Sprain of right foot, initial encounter    3. Onychomycosis        1. Localized swelling of right foot  2. Sprain of right foot, initial encounter  Not as much pain on lateral aspect of ankle where predominant swelling is located. Pain primarily located on bottom of foot and worse with weight bearing  - XR Foot 2 views, Right; Future given pain in midfoot, inability to tolerate foot weight bearing and TTP along right lateral malleolus     2. Onychomycosis  Affects bilateral great toe nails.   - Ambulatory referral to Podiatry; Future of diabetic foot care and assistance with nail trimming, ingrown toe nail  - ciclopirox (PENLAC) 8 % solution; Apply topically nightly. Apply over nail and surrounding skin. Apply daily over previous coat. After seven (7) days, may remove with alcohol and continue cycle for 4 months total  Dispense: 6.6 mL; Refill: 2      Return in about 7 weeks (around 01/10/2022) for In-person at 11:40 am (please override).  Will follow-up HTN at that time given acute pain with foot injury      __________________________________________________________    HPI:    Larey Seat off steps when missed last step.  Has had right ankle swelling and pain.  Pain primarily located on bottom of foot when weight bearing.  Has been hobbling around. Took ibuprofen 600mg  last night, didn't take any today. Has been icing foot.   __________________________________________________________        Medications:  Reviewed in EPIC  __________________________________________________________    Physical Exam:   Vital Signs:  Vitals:    11/25/21 0937   BP: 134/80   BP Site: R Arm   BP Position: Sitting   BP Cuff Size: Large   Pulse: 81   Resp: 17   Temp: 36.3 ??C (97.4 ??F)   TempSrc: Temporal   SpO2: 98%   Weight: 88 kg (194 lb)   Height: 170.2 cm (5' 7) PTHomeBP    Gen: Well appearing, NAD  CV: RRR, no murmurs  Pulm: CTA bilaterally, no crackles or wheezes  Abd: Soft, NTND, normal BS.   MSK:  Tenderness with palpation over right lateral malleolus with significant swelling and very mild ecchymosis. Unable to fully bear weight with standing; pain of midfoot on plantar surface with dorsiflexion of foot. No pain with plantarflexion    PHQ-9 Score:     GAD-7 Score:       Medication adherence and barriers to the treatment plan have been addressed. Opportunities to optimize healthy behaviors have been discussed. Patient / caregiver voiced understanding.

## 2021-11-25 NOTE — Unmapped (Signed)
error 

## 2021-11-25 NOTE — Unmapped (Signed)
L/m with patient to call back regarding xray results.  Clovis Pu, PA noted that there was questionable widening of the distal tib-fib syndesmosis suggests ankle instability.  He would like for her to follow-up with Eulogio Ditch within the next 10-14 days when her swelling goes down.  Will send mychart message as well.

## 2021-11-28 NOTE — Unmapped (Signed)
L/m with patient letting her know about the xray results and provided contact number for appointment center for patient to follow-up with Eulogio Ditch once her swelling has gone down.

## 2021-12-26 NOTE — Unmapped (Signed)
The [facility] has made a third and final attempt to reach this patient to refill the following medication:Exemestane 25mg .      We have left voicemails on the following phone numbers: [phone] and have sent a MyChart message.    Dates contacted: 05/09, 05/15, 05/22  Last scheduled delivery: 09/15/21 for 30 day supply    The patient may be at risk of non-compliance with this medication. The patient should call the [facility] at [phone]  Option 4, then Option 1 (oncology) to refill medication.    Jasper Loser   [facility] Specialty Technician

## 2022-01-10 ENCOUNTER — Ambulatory Visit
Admit: 2022-01-10 | Discharge: 2022-01-10 | Payer: PRIVATE HEALTH INSURANCE | Attending: Medical Oncology | Primary: Medical Oncology

## 2022-01-10 ENCOUNTER — Ambulatory Visit: Admit: 2022-01-10 | Discharge: 2022-01-10 | Payer: PRIVATE HEALTH INSURANCE

## 2022-01-10 NOTE — Unmapped (Addendum)
For the hair thinning:  Biotin daily  Nioxin shampoo  Dermatology visit in [place].    Consider a Neurology visit for the neuropathy.    Return in December with your mammogram to see Konrad Penta, NP.  After that visit, you may see your PCP yearly for your breast exam and mammogram.  We will not need to see you routinely, but are happy to see if you if concerns arise.  In that event, call for an appointment.    It was a pleasure to see you today. Before your next appointment, please do not hesitate to contact your oncology team with questions or concerns.      Griselda Miner, FNP  Nurse Practitioner, [facility]    Monday - Friday 8:00am - 5:00pm  Call [phone], or toll free [phone]  Ask to speak to the Triage Nurse.    Please use MyChart only for the following:  Non-urgent medication refills  Scheduling requests  General questions      Information from ASCO???s Archivist Society of Clinical Oncology) Guideline on Follow-Up Care for Breast Cancer     After treatment for breast cancer, follow-up care is important to help maintain good health, which includes managing any side effects from treatment and watching for long-term side effects (called late effects) or signs of a cancer recurrence (cancer that comes back after treatment). A follow-up care plan may include regular physical examinations and other medical tests to monitor your recovery for the coming months and years.     Many survivors feel worried or anxious that the cancer will come back after treatment. While it often does not, it???s important to talk with your doctor about the possibility of the cancer returning. Most breast cancer recurrences are found by patients between doctor visits. Tell your doctor if you notice any of the following symptoms, as they may be signs of a cancer recurrence:  New lumps in the breast or on the chest wall  Bone pain  Chest pain  Abdominal pain  Shortness of breath or difficulty breathing  Persistent headaches  Persistent coughing  Rash on breast or on the chest wall  Nipple discharge (liquid coming from the nipple)      RECOMMENDATIONS    The recommendations for follow-up care for breast cancer include regular physical examinations, mammograms, and breast/chest self-examinations. This follow-up care may be provided by your oncologist or primary care doctor, as long as your primary care doctor has communicated with your oncologist about appropriate follow-up care and the possible late effects. Always talk with your care team about an appropriate follow-up care plan for you.    Visit your doctor every three to six months for the first three years after the first treatment, every six to 12 months for years four and five, and every year thereafter.  In the years after diagnosis, you may continue to visit your oncologist or transfer your care to a primary care doctor. Women receiving hormonal therapy should talk with their oncologist about how often to schedule follow-up visits for re-evaluation of their treatment.     Schedule a mammogram every 12 months based on your radiologist's and provider's recommendations.     Breast/chest self-examination. Perform a breast or chest self examination every month. This procedure is not a substitute for a mammogram.    Pelvic examination. Continue to visit a gynecologist regularly. Because the drug tamoxifen increases the risk of uterine cancer, women taking this drug should tell their doctors about any  abnormal vaginal bleeding.    Genetic counseling. Another important part of follow-up care is to tell your doctor if you have a history of cancer in your family (or if it develops over time) because you may benefit from genetic counseling. The following risk factors may indicate that breast cancer could run in the family: Ashkenazi Jewish heritage, personal or family history of ovarian cancer, any first-degree relative (mother, sister, daughter) diagnosed with breast cancer before age 6, two or more first-degree or second-degree relatives (grandparent, aunt, uncle) diagnosed with breast cancer, personal or family history of breast cancer in both breasts, history of breast cancer in a female relative.    The following tests are not currently routinely recommended for regular follow-up care because they have not been shown to lengthen the life of a person with breast cancer, however may be utilized when needed based on your individual care. Learn more about why these tests may   not be recommended at www.cancer.net/topfivelist.    A complete blood count (CBC) test and liver and   kidney function tests  Chest x-ray  Bone scan  Liver ultrasound  Computed tomography (CT or CAT) scan  Fluorodeoxyglucose-positron-emission tomography (FDG-PET) scan  Breast magnetic resonance imaging (MRI) test  Breast cancer tumor markers, such as CA 15-3, CA 27.29, and carcinoembryonic antigen (CEA).

## 2022-01-10 NOTE — Unmapped (Signed)
Breast Oncology Follow-up Progress Note    PCP: Kurtis Bushman, MD   Breast Med/Onc: Marc Morgans, MD    Reason for Visit:  Management of breast cancer    Assessment/Plan:     A 66 y.o. woman with a h/o Left breast pT3pN2a, IDC, G2, ER+(95%)/PR+(30%)/HER2-neg, 5/6 LN positive, +LVI and positive surgical margins s/p BCT with targeted axillary LN bx on 07/03/17. Unfortunately her pathology revealed positive margins, and and she required a re-excision with ALND. Completed this on 08/14/17 showing 7 mm of residual tumor, negative margins, 2/23 +LN with larger tumor deposit of 6 mm. We had originally planned for ddAC-T, but given her H/O neuropathy we opted for q 2 week taxol. She experienced a hypersensitivity reaction into the 1st Taxol infusion, and was able to complete that dose at a slower rate.  Accordingly, she was switched to q3w Abraxane, which she tolerated better, and then followed by 4 cycles of AC. Her chemotherapy course was further complicated by a port-related thrombus, but she continued Eliquis for >2 months after treatment and then discontinued shortly after. She completed loco-regional XRT 04/05/18, and started letrozole 04/2018. Due to depression and arthralgias switched to Exemestane 08/2020 but delayed starting until 01/2021 due to high co-pay.     Erika Cabrera discontinued the exemestane in mid April 2023, due to concerns about ongoing hair thinning and joint discomfort.  She has completed 4 years of endocrine therapy and feels the side effects outweigh the benefits of another year of therapy.      Otherwise, her clinical breast exam is normal and she has no signs or symptoms concerning for cancer recurrence.  When she returns in December for her mammogram and visit in RadOnc, she will be 5 years from diagnosis, and may be discharged to her PCP for followup.    Follow-up after early breast cancer was discussed. We recommend follow-up as defined by the American Society of Clinical Oncology. This includes a visit to a health care professional every year.  Symptoms of cancer recurrence, that should be reported in between visits were reviewed. Annual mammogram is recommended.    She is happy about this transition. All of their questions were answered to the best of our ability and to their apparent satisfaction.    Breast Imaging:  B/l MMG 07/29/21 BI-RADS 2 benign. Repeat in 1 year. R US breast: at area of patient's pain demonstrated no mass lesion, fluid collection, or abnormal shadowing.     Bone Health:  04/2018 DEXA: Normal bone density.  01/10/2022 DEXA: Normal bone density.    Supportive Care.   --Left-sided Lymphedema: Persistent-responded well to recent Lymph PT in [place].  --Neuropathy: Chronic, hereditary, predates chemo, worsened on chemo and has continued to progress which is uncharacteristic of chemotherapy-induced PN. On Cymbalta per PCP after gabapentin was no longer helpful. Discussed visit with Neurology to explore etiology and treatment options. She will locate a [place] neurologist.  --Hair thinning: We discussed biotin supplementation (she is taking), and Nioxin shampoo.  Recommended Dermatology visit as hair thinning predates her breast cancer and in past was associated with hirsuitism. She will locate a Facilities manager.    DISPO:  -- Rad onc appt with mammogram is scheduled in 6 months.  -- Return to Medical Oncology as needed with concerns  -- PCP visit annually for clinical breast exam and continued annual mmg    I personally spent 35 minutes face-to-face and non-face-to-face in the care of this patient, which includes all pre,  intra, and post visit time on the date of service.  All documented time was specific to the E/M visit and does not include any procedures that may have been performed.    Patient seen today alongside Dr. Avis Epley who performed the physical examination.  Erika Miner, NP  [facility]  _______________________________________________________________________________________________    Interval History:  Erika Cabrera presents today accompanied by her husband for exemestane follow up. She was last seen on 01/14/21.  --she discontinued the exemestane about 1.5 - 2 months ago mainly because of distressing hair thinning but also due to mild joint discomfort. Unfortunately she hasn't seen improvement since stopping the AI. She takes a MVI with Biotin. Has not seen Derm. Hx hirsutism and was on a study for this years ago and believes she took spironolactone. Has not tried minoxidil or other topicals.  Family history of thyroid disease in her sister and father.   --peripheral neuropathy initially dx in her 20's, was manageable prior to chemo, worsened after chemo, and has continued to progress, involving entire feet and moving up lower legs.  --taking metformin for elevated A1c of 6.9 and has tolerated well. Managed by PCP.  -- DEXA done this afternoon is normal.  --saw PT for left breast and axillary edema, found it very helpful, wears a compression bra  -- No signs or symptoms concerning for recurrence.      She has filled out the [facility] [id number] form which I have reviewed with her and filed in med rec. Comprehensive 12 point ROS is performed and is negative except as noted above.    ECOG PS: 1    Oncology History Overview Note   2018: Left breast cT2 N1a, IDC, G1, +/+/-.     Malignant neoplasm of upper-outer quadrant of left breast in female, estrogen receptor positive (CMS-HCC)   06/07/2017 -  Presenting Symptoms    Left breast palpable UOQ mass     06/07/2017 Interval Scan(s)    Diagnostic MMG at [facility]: left breast UOQ mass difficult to measure, but appears to measure at least 5.6 cm.    [place]: left breast ill defined shadowing lesion at 2:00, 6 CFN measuring 3.7 x 2.6 x 2.5 cm.     Single mildly abnormal node in left axilla with constriction of fatty hilum.     06/13/2017 Biopsy USG core biopsy of left breast 2:00, 6 CFN: IDC, G1, ER+(95%), PR+(30%), HER2 negative by FISH.  USG core biopsy left axillary LN: +LN metastasis.     06/13/2017 -  Other    Seen in [facility] ED following core biopsy in [facility] for axillary pain with non-expanding 4 cm hematoma noted in left axilla. No intervention required other than symptomatic measures (compression, pain management).     06/20/2017 Tumor Board    [facility] recs: left breast cT2 N1a IDC, G1, receptors pending. Per outside imaging there was a single abnormal LN found by [place] only. Awaiting receptors. Need SAVI into that clipped node. Discussed that she otherwise fits ACOSOG Z0011 criteria with negative clinical exam prior to biopsy and single abnormal LN on axillary [place]. Reasonable to do SLNB and excise biopsied node as well. If receptors favorable, consider surgery first approach, good BCT candidate. If high risk (TN, HER+) may need NACT approach. Briefly discussed ALTERNATE, but consensus was that she will likely be recommended for chemo given age and nodal positivity. Meeting surgery, rad/onc today. Will circle back with med/onc as needed.     07/03/2017 Surgery  Left [facility] localized/US guided partial mastectomy and left SLNbx with ALNDx: Invasive mammary carcinoma with mixed ductal and lobular features present in association with in situ carcinoma with mixed ductal and lobular features measuring 8.3 cm with positive inferior and lateral margin. Sentinel lymph node biopsy: 1/1 node positive for metastatic disease measuring 7 mm. No ECE. Palpable axillary lymph nodes: 4/4 lymph nodes with metastatic disease measuring 10 mm.  No ECE.  Additional palpable axillary lymph node 0/1 with isolated tumor cells. SAVI scout in the axilla was defective and there was no localization signal from the SAVI probe.     08/14/2017 Surgery    Left re-excision, ALND w ARM: Lateral margin: Invasive carcinoma with mixed ductal and lobular features measuring 7mm w LVI, margin negative.   Inferior margin: Multiple foci of lymphovasular invasion present.  No evidence of stromal invasion (final margin negative)  Left ALND:  Two of twenty-three lymph nodes positive for carcinoma (2/23).  Size of larger tumor deposit: 6 mm.  Extracapsular extension: Absent     09/21/2017 -  Chemotherapy    Started ddTaxol--->ddAC     09/21/2017 Adverse Reaction    First Taxol infused for ~20 minutes resulting in flushing with chest tightness and nausea.  Infusion was stopped and administered.  Symptoms subsided within 20 minutes but she continued to feel some chest tightness with deep inspiration.  Albuterol nebulizer was given.  She then returned to baseline and infusion was restarted at 1/2 rate and titrated up slowly.  She was able to complete infusion without any further events.      10/05/2017 -  Chemotherapy    Switching to Abraxane due to infusion reaction     10/11/2017 Adverse Reaction    Acute DVT Rt interval jugular, brachiocephalic, subclavian vein-now on Eliquis 5 mg qd.      12/07/2017 - 02/07/2018 Chemotherapy    OP BREAST AC (DOSE DENSE)  DOXOrubicin 60 mg/m2, Cyclophosphamide 600 mg/m2 every 14 days      - 04/05/2018 Radiation    L breast and [place], 50 Gy and 10 Gy boost     04/19/2018 Endocrine/Hormone Therapy    Started Letrozole; but discontinued 08/2020 due to depression and arthralgias.     -- Switched to Exemestane 08/2020 but did not start it due to high co-pay.        Gyn History:   G2P2 with first pregnancy at age 36. Menarche reported at age 77. LMP 2012. Patient is postmenopausal. OCPs yes, for 1 years. HRT no.    Past Medical History  Past Medical History:   Diagnosis Date    Breast cancer (CMS-HCC) 06/2017    Left-Per bx results in [facility]    Depression     GERD (gastroesophageal reflux disease)     Neuropathy     Since 20's.  Progressively worse.        Surgical History  Past Surgical History:   Procedure Laterality Date    ANAL FISSURECTOMY      BREAST BIOPSY Left 06/2017    malignant    BREAST BIOPSY Left 06/2017    Lympnode bx-positive    BREAST LUMPECTOMY Left 06/2017    CHEMOTHERAPY Left 2019    IR INSERT PORT AGE GREATER THAN 5 YRS  09/28/2017    IR INSERT PORT AGE GREATER THAN 5 YRS 09/28/2017 Rush Barer, MD IMG VIR [facility]    PR BREAST REDUCTION Right 06/14/2018    Procedure: REDUCTION MAMMOPLASTY FOR SYMMETRY  PURPOSES;  Surgeon: Arsenio Katz, MD;  Location: ASC OR [facility];  Service: Plastics    PR BX/REMV,LYMPH NODE,DEEP AXILL Left 07/03/2017    Procedure: BX/EXC LYMPH NODE; OPEN, DEEP AXILRY NODE;  Surgeon: Talbert Cage, DO;  Location: ASC OR [facility];  Service: Surgical Oncology    PR COLONOSCOPY W/BIOPSY SINGLE/MULTIPLE N/A 08/12/2020    Procedure: COLONOSCOPY, FLEXIBLE, PROXIMAL TO SPLENIC FLEXURE; WITH BIOPSY, SINGLE OR MULTIPLE;  Surgeon: Vidal Schwalbe, MD;  [facility] GI [facility];  Service: Gastroenterology    PR INTRAOPERATIVE SENTINEL LYMPH NODE ID W DYE INJECTION Left 07/03/2017    Procedure: INTRAOPERATIVE IDENTIFICATION SENTINEL LYMPH NODE(S) INCLUDE INJECTION NON-RADIOACTIVE DYE, WHEN PERFORMED;  Surgeon: Talbert Cage, DO;  Location: ASC OR [facility];  Service: Surgical Oncology    PR INTRAOPERATIVE SENTINEL LYMPH NODE ID W DYE INJECTION Left 08/14/2017    Procedure: INTRAOPERATIVE IDENTIFICATION SENTINEL LYMPH NODE(S) INCLUDE INJECTION NON-RADIOACTIVE DYE, WHEN PERFORMED;  Surgeon: Talbert Cage, DO;  Location: ASC OR [facility];  Service: Surgical Oncology Breast    PR MASTECTOMY, PARTIAL Left 07/03/2017    Procedure: MASTECTOMY, savi scout PARTIAL (EG, LUMPECTOMY, TYLECTOMY, QUADRANTECTOMY, SEGMENTECTOMY);  Surgeon: Talbert Cage, DO;  Location: ASC OR [facility];  Service: Surgical Oncology    PR MASTECTOMY, PARTIAL Left 08/14/2017    Procedure: MASTECTOMY, PARTIAL (EG, LUMPECTOMY, TYLECTOMY, QUADRANTECTOMY, SEGMENTECTOMY) Re-excision;  Surgeon: Talbert Cage, DO;  Location: ASC OR [facility]; Service: Surgical Oncology Breast    PR REMOVE ARMPITS LYMPH NODES COMPLT Left 08/14/2017    Procedure: AXILLARY LYMPHADENECTOMY; COMPLETE;  Surgeon: Talbert Cage, DO;  Location: ASC OR [facility];  Service: Surgical Oncology Breast    RADIATION Left 2019    REDUCTION MAMMAPLASTY Right 06/14/2018       Medications  Current Outpatient Medications   Medication Sig Dispense Refill    acetaminophen (TYLENOL) 500 MG tablet Take 2 tablets (1,000 mg total) by mouth daily as needed for pain.      ciclopirox (PENLAC) 8 % solution Apply topically nightly. Apply over nail and surrounding skin. Apply daily over previous coat. After seven (7) days, may remove with alcohol and continue cycle for 4 months total 6.6 mL 2    docusate sodium (COLACE) 100 MG capsule Take 1 capsule (100 mg total) by mouth daily.      DULoxetine (CYMBALTA) 60 MG capsule Take 1 capsule (60 mg total) by mouth in the morning. 90 capsule 3    exemestane (AROMASIN) 25 mg tablet Take 1 tablet (25 mg total) by mouth in the morning. 30 tablet 11    famotidine (PEPCID) 20 MG tablet Take 1 tablet (20 mg total) by mouth every other day.      metFORMIN (GLUCOPHAGE-XR) 500 MG 24 hr tablet Take 2 tablets (1,000 mg total) by mouth daily before breakfast. 180 tablet 3    polyethylene glycol (MIRALAX) 17 gram packet Take 17 g by mouth daily as needed.  0     No current facility-administered medications for this visit.       Allergies  Allergies   Allergen Reactions    Erythromycin Diarrhea    Penicillins Rash       Personal and Social History  Social History     Social History Narrative    The patient is married. She lives at home with her husband, Ed.  She has two children       Family History  Family History   Problem Relation Age of Onset    No  Known Problems Mother     No Known Problems Father     No Known Problems Sister     No Known Problems Daughter     No Known Problems Maternal Grandmother     No Known Problems Maternal Grandfather     No Known Problems Paternal Grandmother     No Known Problems Paternal Grandfather     Breast cancer Paternal Aunt         44s    Cancer Paternal Aunt     Colon cancer Paternal Aunt     No Known Problems Other     BRCA 1/2 Neg Hx     Endometrial cancer Neg Hx     Ovarian cancer Neg Hx    Cancer-related family history includes Breast cancer in her paternal aunt; Cancer in her paternal aunt; Colon cancer in her paternal aunt. There is no history of Ovarian cancer.    Physical Examination:   There were no vitals filed for this visit.  GENERAL: Well appearing woman in no acute distress.  HEENT: Normocephalic, symmetric. EOMI. Sclera clear.  NECK: supple and non tender  LYMPH: no cervical, supraclavicular or axillary lymphadenopathy.  CHEST/BREASTS: Well healed incision, no evidence of locoregional recurrence. Some expected thickening between the incisions in the left UOQ. Remainder of the bilateral breasts without mass, skin change, retraction or nipple discharge.    LUNGS: respirations even and unlabored.clear to ausculation bilaterally.  CARDIAC: RRR, no murmurs.   ABDOMEN: Soft, nontender, no hepatomegaly or masses  SKIN: No rash, ecchymoses, purpuric lesions noted.  EXTREMITIES: No upper or lower extremity edema, normal skin tone.  MSK: Spine is nontender.  NEURO: Alert, oriented, normal gait &coordination.    Pertinent labs/imaging/pathology reviewed in Epic.  ______________________________________________________________________

## 2022-03-03 ENCOUNTER — Ambulatory Visit: Admit: 2022-03-03 | Discharge: 2022-03-04 | Payer: PRIVATE HEALTH INSURANCE

## 2022-03-03 DIAGNOSIS — G629 Polyneuropathy, unspecified: Principal | ICD-10-CM

## 2022-03-03 DIAGNOSIS — F3342 Major depressive disorder, recurrent, in full remission: Principal | ICD-10-CM

## 2022-03-03 DIAGNOSIS — B351 Tinea unguium: Principal | ICD-10-CM

## 2022-03-03 DIAGNOSIS — E119 Type 2 diabetes mellitus without complications: Principal | ICD-10-CM

## 2022-03-03 DIAGNOSIS — G620 Drug-induced polyneuropathy: Principal | ICD-10-CM

## 2022-03-03 DIAGNOSIS — T451X5A Adverse effect of antineoplastic and immunosuppressive drugs, initial encounter: Principal | ICD-10-CM

## 2022-03-03 LAB — ALBUMIN / CREATININE URINE RATIO
ALBUMIN QUANT URINE: <0.3 mg/dL
CREATININE, URINE: 119.2 mg/dL

## 2022-03-03 LAB — VITAMIN B12: VITAMIN B-12: 259 pg/mL (ref 211–911)

## 2022-03-03 LAB — CALCIUM: CALCIUM: 9.6 mg/dL (ref 8.7–10.4)

## 2022-03-03 MED ORDER — CICLOPIROX 8 % TOPICAL SOLUTION
Freq: Every evening | TOPICAL | 2 refills | 0 days | Status: CP
Start: 2022-03-03 — End: 2022-06-01

## 2022-03-03 MED ORDER — DULOXETINE 60 MG CAPSULE,DELAYED RELEASE
ORAL_CAPSULE | Freq: Every day | ORAL | 3 refills | 90 days | Status: CP
Start: 2022-03-03 — End: 2023-03-03

## 2022-03-03 MED ORDER — METFORMIN ER 500 MG TABLET,EXTENDED RELEASE 24 HR
ORAL_TABLET | Freq: Every day | ORAL | 3 refills | 90 days | Status: CP
Start: 2022-03-03 — End: 2023-03-03

## 2022-03-03 NOTE — Unmapped (Signed)
[facility] BPs  BP#1 133/87   BP#2 133/86  BP#3 131/83    Average BP 132/85  (please note this as a comment in vitals)

## 2022-03-03 NOTE — Unmapped (Signed)
[facility]  Internal Medicine Clinic Visit    Reason for visit: Diabetes, high blood pressure    A/P:    1. Type 2 diabetes mellitus without complication, without long-term current use of insulin (CMS-HCC)    2. Peripheral polyneuropathy    3. Recurrent major depressive disorder, in full remission (CMS-HCC)    4. Chemotherapy-induced neuropathy (CMS-HCC)    5. Onychomycosis    6. Hypercalcemia    7. Primary hypertension        Peripheral polyneuropathy  Chemotherapy-induced neuropathy (CMS-HCC)  Will evaluate for B12 deficiency.  If normal, will further increase Duloxetin to 90mg  daily as we had discussed. (Would need to be 60mg  + 30mg  capsule)  - DULoxetine (CYMBALTA) 60 MG capsule; Take 1 capsule (60 mg total) by mouth daily.  Dispense: 90 capsule; Refill: 3  - DULoxetine (CYMBALTA) 60 MG capsule; Take 1 capsule (60 mg total) by mouth daily.  Dispense: 90 capsule; Refill: 3  - Vitamin B12; Future  - Vitamin B12    Recurrent major depressive disorder, in full remission (CMS-HCC)  Primarily related to prior cancer diagnosis.    Cymbalta primarily for neuropathy that worsened since her chemotherapy  - DULoxetine (CYMBALTA) 60 MG capsule; Take 1 capsule (60 mg total) by mouth daily.  Dispense: 90 capsule; Refill: 3    Type 2 diabetes mellitus without complication, without long-term current use of insulin (CMS-HCC)  Lab Results   Component Value Date    A1C 6.1 03/03/2022   Remains at goal  Will reduce metformin to 500mg  daily.  Continue lifestyle modification that has helped tremendously.    Repeat A1c in 6 months  Declines statin therapy  - Albumin/creatinine urine ratio; Future  - Albumin/creatinine urine ratio      Onychomycosis  Treatment going well with healthy appearing nail that is growing at the base.  Recommended trimming nails to avoid avulsion when wearing closed toe shoes  - ciclopirox (PENLAC) 8 % solution; Apply topically nightly. Apply over nail and surrounding skin. Apply daily over previous coat. After seven (7) days, may remove with alcohol and continue cycle for 4 months total  Dispense: 6.6 mL; Refill: 2    Hypercalcemia  Randomly elevated at last check.  Recheck today.  No symptoms of hypercalcemia  - Calcium    Hypertension: Remains slightly above goal of less than 130/80.  We discussed starting pharmacotherapy, however she would like to defer at this time and continue weight loss efforts given her success so far.  We will reevaluate at her next appointment.  Remains elevated above goal will again consider initiation of therapy    Return in about 3 months (around 06/03/2022) for In-person.        __________________________________________________________    HPI:    No acute issues.  Foot pain has resolved.  We discussed imaging and recommendation to follow-up with ortho. Might be cost prohibitive to her.    Has had ongoing weight loss with lifestyle modification and is really happy about that.  We discussed her current A1c which remains around 6%.  She would like to minimize medications and only be on doses that are really needed.  She still does not want to be on a statin therapy.  Going lifestyle modifications are limited by her neuropathy which limits her duration with which she can stand.  Very bothersome to her.  Was present before chemotherapy but certainly has worsened.  Fluoxetine has helped tremendously and can tell a  significant difference especially when she forgets to take it.  Continues to use medication for toenail fungus and thinks it is getting better.  __________________________________________________________        Medications:  Reviewed in EPIC  __________________________________________________________    Physical Exam:   Vital Signs:  Vitals:    03/03/22 1016   BP: 132/85   BP Site: R Arm   BP Position: Sitting   BP Cuff Size: Large   Pulse: 77   Resp: 18   Temp: 36.4 ??C (97.6 ??F)   TempSrc: Temporal   SpO2: 99%   Weight: 86.6 kg (191 lb)   Height: 170.2 cm (5' 7)          PTHomeBP    Gen: Well appearing, NAD  CV: RRR, no murmurs  Pulm: CTA bilaterally, no crackles or wheezes  Skin:  + Toenail fungus primarily of her great toes with healthy looking nail growing from the bases  MSK: Right ankle with full range of motion without pain      PHQ-9 Score:  PHQ-9 TOTAL SCORE: 1  GAD-7 Score:       Medication adherence and barriers to the treatment plan have been addressed. Opportunities to optimize healthy behaviors have been discussed. Patient / caregiver voiced understanding.

## 2022-03-03 NOTE — Unmapped (Signed)
I will recheck your Blood pressure at your next visit.  The goal is for the blood pressure to be less than 130/80.      Decrease to metformin 500mg  once a day since your A1c is 6.1% and the goal is< 7%.     Blood work today to check the B12 level and calcium levels.

## 2022-08-02 DIAGNOSIS — G4733 Obstructive sleep apnea (adult) (pediatric): Principal | ICD-10-CM

## 2022-08-11 NOTE — Unmapped (Signed)
Follow Up Visit Note    Patient Name: Erika Cabrera  Patient Age: 67 y.o.  Encounter Date: 08/14/2022    Referring Physician:   Cala Bradford, MD  [phone] [address]  [facility]  [place],  [place] [address]    Primary Care Provider:  Roma Schanz, MD    Assessment:  Hx of L breast cancer, pT3N2a, HR +  Neuropathy  Bone Density    Follow up Plan:  Hx Breast cancer:  No clinical evidence of recurrence.  MMG today Birads 2 annual mmg recommended.   Rad onc one year at this point, pt may or may not keep appt with me next year, she does want to continue MMG at [facility] in April, 2023  PRN from med onc        Edema L axillary area:  re refer back to [facility] which was very helpful for her      Neuropathy:  Chronic but thinks its worse.      Bone Density:- 2019  IMPRESSION:  Lumbar spine: Normal bone density  Left proximal femur: Normal bone density    6/23  L hip and L spine - normal.            Interval History:  Erika Cabrera returns today for follow up for hx of breast cancer, she is seen with her husband   She saw Erika Cabrera in June and is prn for med onc.  She is no longer on AI x 9 mos.  She notes her hair has improved and she is please about that.   She continues to work for [employer] remote and no plans to stop at any point.  No new breast concerns but she does note increase in swelling lateral breast and upper arm, she thinks she needs a newer sleeve as well. She did get good results with OT in [place] and would like referral back.      Denies any other systemic concerns but notes neuropathy continues to progress unfavorably    [place]      Oncology History Overview Note   2018: Left breast cT2 N1a, IDC, G1, +/+/-.     Malignant neoplasm of upper-outer quadrant of left breast in female, estrogen receptor positive (CMS-HCC)   06/07/2017 -  Presenting Symptoms    Left breast palpable UOQ mass     06/07/2017 Interval Scan(s)    Diagnostic MMG at [facility]: left breast UOQ mass difficult to measure, but appears to measure at least 5.6 cm.    [place]: left breast ill defined shadowing lesion at 2:00, 6 CFN measuring 3.7 x 2.6 x 2.5 cm.     Single mildly abnormal node in left axilla with constriction of fatty hilum.     06/13/2017 Biopsy    [facility] core biopsy of left breast 2:00, 6 CFN: IDC, G1, ER+(95%), PR+(30%), HER2 negative by FISH.  [facility] core biopsy left axillary LN: +LN metastasis.     06/13/2017 -  Other    Seen in [facility] ED following core biopsy in [facility] for axillary pain with non-expanding 4 cm hematoma noted in left axilla. No intervention required other than symptomatic measures (compression, pain management).     06/20/2017 Tumor Board    [facility] recs: left breast cT2 N1a IDC, G1, receptors pending. Per outside imaging there was a single abnormal LN found by [place] only. Awaiting receptors. Need SAVI into that clipped node.  Discussed that she otherwise fits ACOSOG Z0011 criteria with negative clinical exam prior to biopsy and single abnormal LN on axillary [place]. Reasonable to do SLNB and excise biopsied node as well. If receptors favorable, consider surgery first approach, good BCT candidate. If high risk (TN, HER+) may need NACT approach. Briefly discussed ALTERNATE, but consensus was that she will likely be recommended for chemo given age and nodal positivity. Meeting surgery, rad/onc today. Will circle back with med/onc as needed.     07/03/2017 Surgery    Left [facility] localized/US guided partial mastectomy and left SLNbx with ALNDx: Invasive mammary carcinoma with mixed ductal and lobular features present in association with in situ carcinoma with mixed ductal and lobular features measuring 8.3 cm with positive inferior and lateral margin. Sentinel lymph node biopsy: 1/1 node positive for metastatic disease measuring 7 mm. No ECE. Palpable axillary lymph nodes: 4/4 lymph nodes with metastatic disease measuring 10 mm.  No ECE.  Additional palpable axillary lymph node 0/1 with isolated tumor cells. SAVI scout in the axilla was defective and there was no localization signal from the SAVI probe.     08/14/2017 Surgery    Left re-excision, ALND w ARM: Lateral margin: Invasive carcinoma with mixed ductal and lobular features measuring 7mm w LVI, margin negative.   Inferior margin: Multiple foci of lymphovasular invasion present.  No evidence of stromal invasion (final margin negative)  Left ALND:  Two of twenty-three lymph nodes positive for carcinoma (2/23).  Size of larger tumor deposit: 6 mm.  Extracapsular extension: Absent     09/21/2017 -  Chemotherapy    Started ddTaxol--->ddAC     09/21/2017 Adverse Reaction    First Taxol infused for ~20 minutes resulting in flushing with chest tightness and nausea.  Infusion was stopped and administered.  Symptoms subsided within 20 minutes but she continued to feel some chest tightness with deep inspiration.  Albuterol nebulizer was given.  She then returned to baseline and infusion was restarted at 1/2 rate and titrated up slowly.  She was able to complete infusion without any further events.      10/05/2017 -  Chemotherapy    Switching to Abraxane due to infusion reaction     10/11/2017 Adverse Reaction    Acute DVT Rt interval jugular, brachiocephalic, subclavian vein-now on Eliquis 5 mg qd.      12/07/2017 - 02/07/2018 Chemotherapy    OP BREAST AC (DOSE DENSE)  DOXOrubicin 60 mg/m2, Cyclophosphamide 600 mg/m2 every 14 days      - 04/05/2018 Radiation    L breast and [place], 50 Gy and 10 Gy boost     04/19/2018 Endocrine/Hormone Therapy    Started Letrozole 04/2018, but discontinued 08/2020 due to depression and arthralgias.   Switched to Exemestane 01/2021, discontinued 11/2021 due to arthralgias and persistent hair thinning.   Decided to remain off endocrine therapy after a total of 4 years of therapy.         06/14/18  R reduction mammoplasty.  Breast tissue, right, reduction mammoplasty  - Fibrocystic changes with sclerosing adenosis, changes consistent with chemotherapy effect on benign breast tissue, and associated microcalcifications  - Skin with no specific pathologic abnormality      ROS:  10 organs reviewed and any positives above, rest of the 10 denied.     Physical Examination:  VITAL SIGNS: Temp 36.3 ??C (97.4 ??F) (Temporal)  - Wt 95.8 kg (211 lb 1.6 oz)  - LMP  (LMP Unknown)  - BMI  33.06 kg/m??   CONSTITUTIONAL:  Healthy-appearing female in no acute distress, present with husband  HEENT: Anicteric, glasses, mask in place  NECK:  Supple  HEME/LYMPH/IMMUNO:  No palpable cervical, supraclav or axillary LAN.  CV:  SI S2 RRR.     RESPIRATORY:  Chest clear to auscultation.   GASTROINTESTINAL:  Abdomen soft   MUSCULOSKELETAL:  Independently ambulatory, careful with stepping  BREASTS:  Bilateral breast examination with hyperpigmentation L breast, soft scars, divot at the axilla.  R breast well healed scars.  No palpable concerns in either breast.  L breast is smaller than R.  Has some perhaps L breast edema, there is a tender lower axilla pocket that moves to the back to the lateral scapula line that is larger on L than R and is likely fluid.   SKIN:  No anterior chest lesions.   PSYCHIATRIC:  Mood is happy  EXTREMITIES:   No UE edema  NEUROLOGIC:  Oriented x 3.        Karnofsky/Lansky Performance Status:  80, Normal activity with effort; some signs or symptoms of disease (ECOG equivalent 1)- neuropathy    MMG B/L  Today:  birads 2, recommended for one year

## 2022-08-14 ENCOUNTER — Ambulatory Visit: Admit: 2022-08-14 | Payer: PRIVATE HEALTH INSURANCE

## 2022-08-14 ENCOUNTER — Ambulatory Visit: Admit: 2022-08-14 | Discharge: 2022-08-15 | Payer: PRIVATE HEALTH INSURANCE

## 2022-08-14 ENCOUNTER — Ambulatory Visit: Admit: 2022-08-14 | Discharge: 2022-08-14 | Payer: PRIVATE HEALTH INSURANCE

## 2022-08-14 DIAGNOSIS — C50412 Malignant neoplasm of upper-outer quadrant of left female breast: Principal | ICD-10-CM

## 2022-08-14 DIAGNOSIS — Z170 Estrogen receptor positive status [ER+]: Principal | ICD-10-CM

## 2022-08-14 NOTE — Unmapped (Unsigned)
SiteLast TxDose  ZO:XWRUEAV: 03/29/2018: 5,000/5,000 cGy  WU:JWJXBJYN: 03/29/2018: 5,000/5,000 cGy  Rx:BST_LtBre: 04/05/2018: 1,000/1,000 cGy  WGN:FAOZHYQM: 04/05/2018: 6,000 cGy    08/14/2022      Subjective/Assessment/Recommendations:    1. Fatigue: No issues  2. Pain:  discomfort @ lymphedema area, left breast,axilla and arm  3. Skin: Intact  4. Lymphedema: Present  5. Prescription Needs: None  6. Psychosocial: Accompanied by family member/other today  7. Surveillance: Mammogram today  8: Hormones: No

## 2022-08-25 ENCOUNTER — Ambulatory Visit: Admit: 2022-08-25 | Discharge: 2022-08-26 | Payer: PRIVATE HEALTH INSURANCE

## 2022-08-25 DIAGNOSIS — H9190 Unspecified hearing loss, unspecified ear: Principal | ICD-10-CM

## 2022-08-25 DIAGNOSIS — F3342 Major depressive disorder, recurrent, in full remission: Principal | ICD-10-CM

## 2022-08-25 DIAGNOSIS — T451X5A Adverse effect of antineoplastic and immunosuppressive drugs, initial encounter: Principal | ICD-10-CM

## 2022-08-25 DIAGNOSIS — E119 Type 2 diabetes mellitus without complications: Principal | ICD-10-CM

## 2022-08-25 DIAGNOSIS — G620 Drug-induced polyneuropathy: Principal | ICD-10-CM

## 2022-08-25 DIAGNOSIS — E782 Mixed hyperlipidemia: Principal | ICD-10-CM

## 2022-08-25 DIAGNOSIS — H6122 Impacted cerumen, left ear: Principal | ICD-10-CM

## 2022-08-25 LAB — BASIC METABOLIC PANEL
ANION GAP: 6 mmol/L (ref 5–14)
BLOOD UREA NITROGEN: 7 mg/dL — ABNORMAL LOW (ref 9–23)
BUN / CREAT RATIO: 11
CALCIUM: 9.7 mg/dL (ref 8.7–10.4)
CHLORIDE: 107 mmol/L (ref 98–107)
CO2: 27.6 mmol/L (ref 20.0–31.0)
CREATININE: 0.62 mg/dL
EGFR CKD-EPI (2021) FEMALE: >90 mL/min/{1.73_m2} (ref >=60)
GLUCOSE RANDOM: 114 mg/dL — ABNORMAL HIGH (ref 70–99)
POTASSIUM: 3.8 mmol/L (ref 3.4–4.8)
SODIUM: 141 mmol/L (ref 135–145)

## 2022-08-25 LAB — LIPID PANEL
CHOLESTEROL/HDL RATIO SCREEN: 5.5 — ABNORMAL HIGH (ref 1.0–4.5)
CHOLESTEROL: 164 mg/dL (ref <=200)
HDL CHOLESTEROL: 30 mg/dL — ABNORMAL LOW (ref 40–60)
LDL CHOLESTEROL CALCULATED: 113 mg/dL — ABNORMAL HIGH (ref 40–99)
NON-HDL CHOLESTEROL: 134 mg/dL — ABNORMAL HIGH (ref 70–130)
TRIGLYCERIDES: 103 mg/dL (ref 0–150)
VLDL CHOLESTEROL CAL: 20.6 mg/dL (ref 11–41)

## 2022-08-25 MED ORDER — DULOXETINE 60 MG CAPSULE,DELAYED RELEASE
ORAL_CAPSULE | Freq: Every day | ORAL | 3 refills | 90 days | Status: CP
Start: 2022-08-25 — End: 2023-08-25

## 2022-08-25 MED ORDER — DULOXETINE 30 MG CAPSULE,DELAYED RELEASE
ORAL_CAPSULE | Freq: Every day | ORAL | 3 refills | 90 days | Status: CP
Start: 2022-08-25 — End: 2023-08-25

## 2022-08-25 MED ORDER — METFORMIN ER 500 MG TABLET,EXTENDED RELEASE 24 HR
ORAL_TABLET | Freq: Every day | ORAL | 3 refills | 90 days | Status: CP
Start: 2022-08-25 — End: 2023-08-25

## 2022-08-25 NOTE — Unmapped (Signed)
[facility] - Faculty Practice  Internal Medicine Clinic Visit    Reason for visit: diabetes, hearing loss in left ear    A/P:    1. Type 2 diabetes mellitus without complication, without long-term current use of insulin (CMS-HCC)    2. Recurrent major depressive disorder, in full remission (CMS-HCC)    3. Chemotherapy-induced neuropathy (CMS-HCC)    4. Hyperlipidemia, mixed    5. Impacted cerumen of left ear    6. Hearing loss, unspecified hearing loss type, unspecified laterality        1. Type 2 diabetes mellitus without complication, without long-term current use of insulin (CMS-HCC)  Lab Results   Component Value Date    A1C 7.1 (H) 08/25/2022   Has trended up and above goal < 7%.  Increase metformin (previously decreased with A1c had become under better control.   - Telehealth Fundus Photography Screening - OU - Both Eyes  - metFORMIN (GLUCOPHAGE-XR) 500 MG 24 hr tablet; Take 2 tablets (1,000 mg total) by mouth daily before breakfast.  Dispense: 180 tablet; Refill: 3  - Basic Metabolic Panel  - Lipid Panel  - discussed that GLP1-RA like Ozempic not strongly indicated as DM is only very mildly above goal, BMI< 35 and able to further titrate metformin.  No other indications such as CKD or heart failure.  Would like to loose weight and advised increase in metformin can help with that as well.     PHQ-9 PHQ-9 TOTAL SCORE   03/03/2022  10:00 AM 1   02/23/2021  12:00 PM 0   12/17/2019   8:00 AM 1   04/05/2018   9:00 AM 1   Remains in remission. Cymbalta primarily for neuropathy  - DULoxetine (CYMBALTA) 60 MG capsule; Take 1 capsule (60 mg total) by mouth daily. With 30mg  capsule for 90mg  total daily  Dispense: 90 capsule; Refill: 3    3. Chemotherapy-induced neuropathy (CMS-HCC)  Increased symptoms.  Issue with prior prescriptions written and has not yet increased.  Increase to Cymbalta 90mg  (60mg  + 30mg  capsules) daily.  - DULoxetine (CYMBALTA) 60 MG capsule; Take 1 capsule (60 mg total) by mouth daily. With 30mg  capsule for 90mg  total daily  Dispense: 90 capsule; Refill: 3  - DULoxetine (CYMBALTA) 30 MG capsule; Take 1 capsule (30 mg total) by mouth daily. With 60mg  capsule for 90mg  total daily  Dispense: 90 capsule; Refill: 3    4. Hyperlipidemia, mixed  Discussed that regardless of her lipid panel, statin is recommended given her diabetes diagnosis.  She does not want to to start statin therapy.    - Lipid Panel    5. Impacted cerumen of left ear  Irrigation and instrumentation removed small amount and able to see small amount of TM, but still unable to grossly hear finger rub on left.  - docusate (COLACE) oral liquid  - Ear wax removal  - Ambulatory referral to ENT; Future  Removal of wax was performed with irrigation/lavage in left ear.      6. Hearing loss, unspecified hearing loss type, unspecified laterality  With some cerumen impaction  - Ambulatory referral to ENT; Future      Return in about 3 months (around 11/24/2022) for In-person.        __________________________________________________________    HPI:    Presents with husband.   Happy about blood pressure.  We review A1c.  Diet not ideal in the past few months with the holidays. Asks about Ozempic because twin brother  takes has lost weight, but didn't really need to loose weight. Has diabetes as well.   In past week or so has suddenly lost hearing in left ear.  No pain in left ear, everything sounds muffled. No trauma  __________________________________________________________        Medications:  Reviewed in EPIC  __________________________________________________________    Physical Exam:   Vital Signs:  Vitals:    08/25/22 0843   BP: 125/84   BP Site: L Arm   BP Position: Sitting   BP Cuff Size: Large   Pulse: 71   Resp: 17   Temp: 36.4 ??C (97.5 ??F)   TempSrc: Temporal   SpO2: 99%   Weight: 95.7 kg (211 lb)   Height: 170.2 cm (5' 7)          PTHomeBP    Gen: Well appearing, NAD  HEENT:  cerumen impaction on left; after irrigation and instrumentation, visualized some TM.  No gross hearing to finger rub on the left.  CV: RRR, no murmurs  Pulm: CTA bilaterally, no crackles or wheezes  Abd: Soft, NTND, normal BS.       PHQ-9 Score:     GAD-7 Score:       Medication adherence and barriers to the treatment plan have been addressed. Opportunities to optimize healthy behaviors have been discussed. Patient / caregiver voiced understanding.

## 2022-08-25 NOTE — Unmapped (Addendum)
Your hemoglobin A1c is increased 7.1%.  Please increase the metformin back to 1000mg  once a day.     I sent two prescriptions for Cymbalta a 30mg  and a 60mg  daily.     Take an omega 3 fatty acid (Fish oil) one a day.     We did talk about starting a statin medication which is recommended given your diabetes.      Use Debrox ear drops to try to loosen the wax.     Please schedule with ENT regarding the hearing loss and the ear wax

## 2022-09-06 ENCOUNTER — Ambulatory Visit
Admit: 2022-09-06 | Discharge: 2022-09-07 | Payer: PRIVATE HEALTH INSURANCE | Attending: Student in an Organized Health Care Education/Training Program | Primary: Student in an Organized Health Care Education/Training Program

## 2022-09-06 DIAGNOSIS — S161XXA Strain of muscle, fascia and tendon at neck level, initial encounter: Principal | ICD-10-CM

## 2022-09-06 MED ORDER — NAPROXEN 500 MG TABLET
ORAL_TABLET | Freq: Two times a day (BID) | ORAL | 0 refills | 7 days | Status: CP
Start: 2022-09-06 — End: 2022-09-13
  Filled 2022-09-06: qty 14, 7d supply, fill #0

## 2022-09-06 MED ORDER — CYCLOBENZAPRINE 5 MG TABLET
ORAL_TABLET | Freq: Three times a day (TID) | ORAL | 0 refills | 5 days | Status: CP | PRN
Start: 2022-09-06 — End: 2022-09-11
  Filled 2022-09-06: qty 15, 5d supply, fill #0

## 2022-09-06 NOTE — Unmapped (Signed)
It was nice to see you again today! Below is a summary of what we discussed:    - Start naproxen to help with the inflammation in your neck. You can also take flexeril three times a day as needed for muscle spasms.  - Pick up voltaren gel over the counter to rub on your neck.   - You can use a heating pad as well or run warm water over your neck/shoulders. Do not use the heating pad immediately after applying the voltaren gel.   - Try doing some of the neck stretches included in your AVS.     Follow up as needed.     General Clinic Information:  - Our clinic is open Monday through Friday 8AM-5PM. Please call [phone] (or toll free [phone]) or send a MyChart message to request an appointment. Our fax number is [phone].  - Our Same Day Clinic functions as an urgent care that can see you for same day appointments if you primary provider is not available. Please call [phone] (or toll free [phone]) and ask for an appointment in the Same Day Clinic  - We have a full service lab, imaging services, and outpatient pharmacy on the first floor of [place].   - If you need medical attention after 5PM during the week or it's the weekend, call the [facility] 24/7 Nursing Line [phone] to get nurse advice.

## 2022-09-06 NOTE — Unmapped (Signed)
[place] [facility] at [facility]       Type of visit:  face to face    Reason for visit: Follow up     Questions / Concerns that need to be addressed:     - pain in neck and across upper back; started over the weekend after playing with granddaughter; has gotten progressively worse   - work Midwife Consent to Treat (GCT) for non-epic video visits only: MyChart consent      Screening BP- 144/95 83        [employer] BPs (complete if screening BP has a systolic  > 139 or diastolic > 89)  BP#1 138/90 77   BP#2 135/95 79  BP#3 132/88 81    Average BP 135/91 79  (please note this as a comment in vitals)         Allergies reviewed: Yes    Medication reviewed: Yes  Pended refills? No        HCDM reviewed and updated in Epic:    We are working to make sure all of our patients??? wishes are updated in Epic and part of that is documenting a Environmental health practitioner for each patient  A Health Care Decision Maker is someone you choose who can make health care decisions for you if you are not able - who would you most want to do this for you????  is already up to date.        BPAs completed:  none          __________________________________________________________________________________________    SCREENINGS COMPLETED IN FLOWSHEETS    HARK Screening       AUDIT       PHQ2       PHQ9          P4 Suicidality Screener                GAD7       COPD Assessment

## 2022-09-06 NOTE — Unmapped (Signed)
Internal Medicine Clinic Visit    Reason for visit: Neck pain    A/P:         1. Acute strain of neck muscle, initial encounter      Cervical strain: Reports 3 days of neck pain and cervical paraspinal muscle (L>R) stiffness and spasm. Symptoms began after a day of increased activity with her granddaughter. On exam, she has decreased ROM on neck flexion, extension, and lateral rotation as well as TTP of her BL cervical paraspinal muscles.   - Naproxen 500mg  BID x 7 days. Also prescribed a short course of flexeril prn.   - Recommended voltaren gel prn, warm showers, and heating pad.   - Cervical strain exercises/stretches included in her AVS.     Return if symptoms worsen or fail to improve.  __________________________________________________________    HPI:  Patient reports pain in her neck that started three days ago. One day prior to the onset of her pain, she had spent the day playing with her granddaughter more than usual. She reports that her pain is located in her neck and cervical paraspinal muscles BL (L>R). She has associated muscle stiffness and spasm. No associated numbness/tingling in her arms from her baseline (chronic issue 2/2 chemotherapy induced neuropathy). Has not been able to sleep the last 2 nights because of the pain and not being able to get comfortable. Has tried ibuprofen and hemp cream lotion with some mild relief.   __________________________________________________________    Medications:  Reviewed in EPIC  __________________________________________________________    Physical Exam:   Vital Signs:  Vitals:    09/06/22 0911 09/06/22 0915   BP: 144/95 135/91   Pulse: 83 79   Temp: 36.9 ??C (98.5 ??F)    TempSrc: Oral    SpO2: 98%    Weight: 93.8 kg (206 lb 12.8 oz)    Height: 170.2 cm (5' 7)           PTHomeBP    The patient???s Average Home Blood Pressure during the last two weeks is :   /   based on  readings    Gen: Well appearing, NAD.  CV: RRR.  Pulm: CTA bilaterally, no crackles or wheezes.  Abd: Soft, NTND.  MSK: Decreased ROM on neck flexion, extension, and lateral rotation. No TTP along her cervical [place]. TTP of her BL cervical paraspinal muscles (L>R). Full shoulder ROM.   Ext: No LE edema.    PHQ-9 Score:     GAD-7 Score:       Medication adherence and barriers to the treatment plan have been addressed. Opportunities to optimize healthy behaviors have been discussed. Patient / caregiver voiced understanding.

## 2022-09-08 NOTE — Unmapped (Signed)
Specialty Medication(s): exemestane    Erika Cabrera has been dis-enrolled from the [facility] specialty pharmacy services due to multiple unsuccessful outreach attempts by the pharmacy.    Additional information provided to the patient: no    [name], [facility]  [facility] Specialty Pharmacist

## 2022-11-14 ENCOUNTER — Ambulatory Visit: Admit: 2022-11-14 | Discharge: 2022-11-14 | Payer: PRIVATE HEALTH INSURANCE

## 2022-11-14 DIAGNOSIS — Z6831 Body mass index (BMI) 31.0-31.9, adult: Principal | ICD-10-CM

## 2022-11-14 DIAGNOSIS — E782 Mixed hyperlipidemia: Principal | ICD-10-CM

## 2022-11-14 DIAGNOSIS — E119 Type 2 diabetes mellitus without complications: Principal | ICD-10-CM

## 2022-11-14 MED ORDER — METFORMIN ER 500 MG TABLET,EXTENDED RELEASE 24 HR
ORAL_TABLET | Freq: Two times a day (BID) | ORAL | 3 refills | 90 days | Status: CP
Start: 2022-11-14 — End: 2023-11-14

## 2023-03-08 ENCOUNTER — Ambulatory Visit: Admit: 2023-03-08 | Discharge: 2023-03-09 | Payer: MEDICARE

## 2023-03-08 DIAGNOSIS — M545 Bilateral low back pain without sciatica, unspecified chronicity: Principal | ICD-10-CM

## 2023-04-02 ENCOUNTER — Ambulatory Visit: Admit: 2023-04-02 | Discharge: 2023-04-03 | Payer: MEDICARE

## 2023-04-02 DIAGNOSIS — Z170 Estrogen receptor positive status [ER+]: Principal | ICD-10-CM

## 2023-04-02 DIAGNOSIS — C50412 Malignant neoplasm of upper-outer quadrant of left female breast: Principal | ICD-10-CM

## 2023-04-02 DIAGNOSIS — R519 Face pain: Principal | ICD-10-CM

## 2023-04-02 DIAGNOSIS — U071 COVID: Principal | ICD-10-CM

## 2023-04-02 DIAGNOSIS — J029 Acute pharyngitis, unspecified: Principal | ICD-10-CM

## 2023-04-02 MED ORDER — DOXYCYCLINE HYCLATE 100 MG TABLET
ORAL_TABLET | Freq: Two times a day (BID) | ORAL | 0 refills | 7 days | Status: CP
Start: 2023-04-02 — End: 2023-04-09

## 2023-04-02 MED ORDER — PAXLOVID 300 MG (150 MG X 2)-100 MG TABLETS IN A DOSE PACK
ORAL_TABLET | 0 refills | 0 days | Status: CP
Start: 2023-04-02 — End: ?

## 2023-08-03 DIAGNOSIS — T451X5A Adverse effect of antineoplastic and immunosuppressive drugs, initial encounter: Principal | ICD-10-CM

## 2023-08-03 DIAGNOSIS — G620 Drug-induced polyneuropathy: Principal | ICD-10-CM

## 2023-08-03 MED ORDER — DULOXETINE 30 MG CAPSULE,DELAYED RELEASE
ORAL_CAPSULE | Freq: Every day | ORAL | 2 refills | 90.00 days | Status: CP
Start: 2023-08-03 — End: 2024-04-29

## 2023-08-20 ENCOUNTER — Ambulatory Visit: Admit: 2023-08-20 | Discharge: 2023-08-21 | Payer: MEDICARE

## 2023-08-20 DIAGNOSIS — M62838 Other muscle spasm: Principal | ICD-10-CM

## 2023-08-20 MED ORDER — KETOROLAC 60 MG/2 ML INTRAMUSCULAR SOLUTION
Freq: Once | INTRAMUSCULAR | 0 refills | 1.00 days | Status: CP
Start: 2023-08-20 — End: 2023-08-20

## 2023-08-22 ENCOUNTER — Emergency Department
Admit: 2023-08-22 | Discharge: 2023-08-22 | Disposition: A | Discharge status: Home / Self Care | Payer: MEDICARE | Attending: Emergency Medicine

## 2023-08-22 ENCOUNTER — Ambulatory Visit: Admit: 2023-08-22 | Discharge: 2023-08-23 | Payer: MEDICARE

## 2023-08-22 DIAGNOSIS — W19XXXD Unspecified fall, subsequent encounter: Principal | ICD-10-CM

## 2023-08-22 DIAGNOSIS — Z170 Estrogen receptor positive status [ER+]: Principal | ICD-10-CM

## 2023-08-22 DIAGNOSIS — M542 Cervicalgia: Principal | ICD-10-CM

## 2023-08-22 DIAGNOSIS — C50412 Malignant neoplasm of upper-outer quadrant of left female breast: Principal | ICD-10-CM

## 2023-08-22 DIAGNOSIS — W108XXD Fall (on) (from) other stairs and steps, subsequent encounter: Principal | ICD-10-CM

## 2023-08-22 DIAGNOSIS — M62830 Muscle spasm of back: Principal | ICD-10-CM

## 2023-08-22 DIAGNOSIS — G4486 Cervicogenic headache: Principal | ICD-10-CM

## 2023-08-22 DIAGNOSIS — E119 Type 2 diabetes mellitus without complications: Principal | ICD-10-CM

## 2023-08-22 DIAGNOSIS — S139XXA Sprain of joints and ligaments of unspecified parts of neck, initial encounter: Principal | ICD-10-CM

## 2023-08-22 MED ORDER — DIAZEPAM 5 MG TABLET
ORAL_TABLET | Freq: Four times a day (QID) | ORAL | 0 refills | 4.00 days | Status: CP | PRN
Start: 2023-08-22 — End: 2023-08-27

## 2023-08-22 MED ORDER — KETOROLAC 10 MG TABLET
ORAL_TABLET | Freq: Four times a day (QID) | ORAL | 0 refills | 5.00 days | Status: CP | PRN
Start: 2023-08-22 — End: 2023-08-27

## 2023-08-28 ENCOUNTER — Ambulatory Visit: Admit: 2023-08-28 | Payer: MEDICARE

## 2023-08-28 ENCOUNTER — Inpatient Hospital Stay: Admit: 2023-08-28 | Discharge: 2023-08-29 | Payer: MEDICARE

## 2023-08-28 ENCOUNTER — Inpatient Hospital Stay: Admit: 2023-08-28 | Discharge: 2023-08-28 | Payer: MEDICARE

## 2023-09-01 DIAGNOSIS — G620 Drug-induced polyneuropathy: Principal | ICD-10-CM

## 2023-09-01 DIAGNOSIS — T451X5A Adverse effect of antineoplastic and immunosuppressive drugs, initial encounter: Principal | ICD-10-CM

## 2023-09-01 DIAGNOSIS — F3342 Major depressive disorder, recurrent, in full remission: Principal | ICD-10-CM

## 2023-09-01 DIAGNOSIS — E119 Type 2 diabetes mellitus without complications: Principal | ICD-10-CM

## 2023-09-01 MED ORDER — DULOXETINE 60 MG CAPSULE,DELAYED RELEASE
ORAL_CAPSULE | 0 refills | 0.00 days
Start: 2023-09-01 — End: ?

## 2023-09-01 MED ORDER — METFORMIN ER 500 MG TABLET,EXTENDED RELEASE 24 HR
ORAL_TABLET | 0 refills | 0.00 days
Start: 2023-09-01 — End: ?

## 2023-09-03 MED ORDER — METFORMIN ER 500 MG TABLET,EXTENDED RELEASE 24 HR
ORAL_TABLET | 3 refills | 0.00 days | Status: CP
Start: 2023-09-03 — End: ?

## 2023-09-03 MED ORDER — DULOXETINE 60 MG CAPSULE,DELAYED RELEASE
ORAL_CAPSULE | 0 refills | 0.00 days
Start: 2023-09-03 — End: ?

## 2023-09-04 ENCOUNTER — Encounter: Admit: 2023-09-04 | Discharge: 2023-09-05 | Payer: MEDICARE

## 2023-09-04 MED ORDER — DULOXETINE 60 MG CAPSULE,DELAYED RELEASE
ORAL_CAPSULE | Freq: Every day | ORAL | 3 refills | 90.00 days | Status: CP
Start: 2023-09-04 — End: 2024-09-03

## 2023-09-04 MED ORDER — DULOXETINE 30 MG CAPSULE,DELAYED RELEASE
ORAL_CAPSULE | Freq: Every day | ORAL | 3 refills | 90.00 days | Status: CP
Start: 2023-09-04 — End: 2024-05-31

## 2023-11-20 ENCOUNTER — Ambulatory Visit: Admit: 2023-11-20 | Discharge: 2023-11-21 | Payer: Medicare (Managed Care)

## 2023-11-20 DIAGNOSIS — F3342 Major depressive disorder, recurrent, in full remission: Principal | ICD-10-CM

## 2023-11-20 DIAGNOSIS — T451X5A Adverse effect of antineoplastic and immunosuppressive drugs, initial encounter: Principal | ICD-10-CM

## 2023-11-20 DIAGNOSIS — G620 Drug-induced polyneuropathy: Principal | ICD-10-CM

## 2023-11-20 DIAGNOSIS — E119 Type 2 diabetes mellitus without complications: Principal | ICD-10-CM

## 2023-11-20 MED ORDER — DULOXETINE 30 MG CAPSULE,DELAYED RELEASE
ORAL_CAPSULE | Freq: Every day | ORAL | 3 refills | 90.00 days | Status: CP
Start: 2023-11-20 — End: 2024-08-16

## 2023-11-20 MED ORDER — METFORMIN ER 500 MG TABLET,EXTENDED RELEASE 24 HR
ORAL_TABLET | Freq: Every day | ORAL | 3 refills | 90.00 days | Status: CP
Start: 2023-11-20 — End: ?

## 2023-11-20 MED ORDER — DULOXETINE 60 MG CAPSULE,DELAYED RELEASE
ORAL_CAPSULE | Freq: Every day | ORAL | 3 refills | 90.00 days | Status: CP
Start: 2023-11-20 — End: 2024-11-19

## 2023-11-20 MED ORDER — LANCETS
11 refills | 0.00 days | Status: CP
Start: 2023-11-20 — End: ?

## 2023-11-20 MED ORDER — BLOOD-GLUCOSE METER KIT WRAPPER
11 refills | 0.00 days | Status: CP
Start: 2023-11-20 — End: 2024-11-19

## 2023-11-20 MED ORDER — BLOOD GLUCOSE TEST STRIPS
ORAL_STRIP | 11 refills | 0.00 days | Status: CP
Start: 2023-11-20 — End: 2024-11-19

## 2023-12-04 ENCOUNTER — Other Ambulatory Visit: Payer: Self-pay | Admitting: Family Medicine

## 2023-12-04 DIAGNOSIS — Z136 Encounter for screening for cardiovascular disorders: Secondary | ICD-10-CM

## 2024-02-19 ENCOUNTER — Ambulatory Visit: Admit: 2024-02-19 | Discharge: 2024-02-20 | Payer: Medicare (Managed Care)

## 2024-02-19 ENCOUNTER — Encounter: Admit: 2024-02-19 | Discharge: 2024-02-20 | Payer: Medicare (Managed Care)

## 2024-02-19 DIAGNOSIS — E119 Type 2 diabetes mellitus without complications: Principal | ICD-10-CM

## 2024-02-19 DIAGNOSIS — E782 Mixed hyperlipidemia: Principal | ICD-10-CM

## 2024-02-19 DIAGNOSIS — G620 Drug-induced polyneuropathy: Principal | ICD-10-CM

## 2024-02-19 DIAGNOSIS — T451X5A Adverse effect of antineoplastic and immunosuppressive drugs, initial encounter: Principal | ICD-10-CM

## 2024-02-19 DIAGNOSIS — Z6826 Body mass index (BMI) 26.0-26.9, adult: Principal | ICD-10-CM

## 2024-02-19 DIAGNOSIS — F3342 Major depressive disorder, recurrent, in full remission: Principal | ICD-10-CM

## 2024-02-19 MED ORDER — METFORMIN ER 500 MG TABLET,EXTENDED RELEASE 24 HR
ORAL_TABLET | Freq: Every day | ORAL | 3 refills | 180.00000 days | Status: CP
Start: 2024-02-19 — End: ?

## 2024-09-02 ENCOUNTER — Inpatient Hospital Stay: Admit: 2024-09-02 | Discharge: 2024-09-02 | Payer: Medicare (Managed Care)

## 2024-09-02 ENCOUNTER — Inpatient Hospital Stay
Admit: 2024-09-02 | Discharge: 2024-09-06 | Payer: Medicare (Managed Care) | Attending: Adult Health | Primary: Adult Health

## 2024-09-02 ENCOUNTER — Ambulatory Visit: Admit: 2024-09-02 | Payer: Medicare (Managed Care) | Attending: Adult Health | Primary: Adult Health

## 2024-09-04 ENCOUNTER — Inpatient Hospital Stay: Admit: 2024-09-04 | Discharge: 2024-09-04 | Payer: Medicare (Managed Care)
